# Patient Record
Sex: Male | Born: 1965 | Race: Black or African American | Hispanic: No | Marital: Married | Smoking: Never smoker
Health system: Southern US, Community
[De-identification: ages and names within clinical notes are randomized; demographics above are authoritative.]

## PROBLEM LIST (undated history)

## (undated) DIAGNOSIS — F419 Anxiety disorder, unspecified: Secondary | ICD-10-CM

## (undated) DIAGNOSIS — G4730 Sleep apnea, unspecified: Secondary | ICD-10-CM

## (undated) DIAGNOSIS — K219 Gastro-esophageal reflux disease without esophagitis: Secondary | ICD-10-CM

## (undated) DIAGNOSIS — E119 Type 2 diabetes mellitus without complications: Secondary | ICD-10-CM

## (undated) DIAGNOSIS — I10 Essential (primary) hypertension: Secondary | ICD-10-CM

## (undated) DIAGNOSIS — I428 Other cardiomyopathies: Secondary | ICD-10-CM

## (undated) DIAGNOSIS — M1990 Unspecified osteoarthritis, unspecified site: Secondary | ICD-10-CM

## (undated) DIAGNOSIS — E785 Hyperlipidemia, unspecified: Secondary | ICD-10-CM

## (undated) HISTORY — PX: CYSTOSCOPY: SUR368

## (undated) HISTORY — DX: Unspecified osteoarthritis, unspecified site: M19.90

## (undated) HISTORY — DX: Sleep apnea, unspecified: G47.30

## (undated) HISTORY — DX: Other cardiomyopathies: I42.8

## (undated) HISTORY — DX: Anxiety disorder, unspecified: F41.9

## (undated) HISTORY — DX: Type 2 diabetes mellitus without complications: E11.9

## (undated) HISTORY — DX: Gastro-esophageal reflux disease without esophagitis: K21.9

---

## 2007-12-16 ENCOUNTER — Ambulatory Visit: Payer: Self-pay

## 2011-04-01 ENCOUNTER — Emergency Department: Payer: Self-pay | Admitting: Emergency Medicine

## 2011-11-09 ENCOUNTER — Emergency Department: Payer: Self-pay | Admitting: Emergency Medicine

## 2013-07-03 ENCOUNTER — Emergency Department: Payer: Self-pay | Admitting: Unknown Physician Specialty

## 2013-07-03 LAB — COMPREHENSIVE METABOLIC PANEL
Anion Gap: 5 — ABNORMAL LOW (ref 7–16)
Chloride: 104 mmol/L (ref 98–107)
EGFR (Non-African Amer.): >60
SGOT(AST): 21 U/L (ref 15–37)
SGPT (ALT): 25 U/L (ref 12–78)
Sodium: 139 mmol/L (ref 136–145)
Total Protein: 7.3 g/dL (ref 6.4–8.2)

## 2013-07-03 LAB — URINALYSIS, COMPLETE
Glucose,UR: NEGATIVE mg/dL (ref 0–75)
Hyaline Cast: 3
Ketone: NEGATIVE
Leukocyte Esterase: NEGATIVE
Protein: NEGATIVE
RBC,UR: 7 /HPF (ref 0–5)
Specific Gravity: 1.026 (ref 1.003–1.030)
Squamous Epithelial: NONE SEEN
WBC UR: NONE SEEN /HPF (ref 0–5)

## 2013-07-03 LAB — CBC
HCT: 45.6 % (ref 40.0–52.0)
HGB: 15.2 g/dL (ref 13.0–18.0)
MCH: 27.2 pg (ref 26.0–34.0)
MCHC: 33.3 g/dL (ref 32.0–36.0)
MCV: 82 fL (ref 80–100)
Platelet: 291 10*3/uL (ref 150–440)
RDW: 14.3 % (ref 11.5–14.5)

## 2013-07-03 LAB — TROPONIN I: Troponin-I: <0.02 ng/mL

## 2013-07-04 LAB — URINE CULTURE

## 2016-12-15 ENCOUNTER — Emergency Department
Admission: EM | Admit: 2016-12-15 | Discharge: 2016-12-15 | Disposition: A | Discharge status: Home / Self Care | Payer: Self-pay | Attending: Student in an Organized Health Care Education/Training Program | Admitting: Student in an Organized Health Care Education/Training Program

## 2016-12-15 ENCOUNTER — Encounter: Payer: Self-pay | Admitting: Emergency Medicine

## 2016-12-15 DIAGNOSIS — J029 Acute pharyngitis, unspecified: Secondary | ICD-10-CM | POA: Insufficient documentation

## 2016-12-15 DIAGNOSIS — R0989 Other specified symptoms and signs involving the circulatory and respiratory systems: Secondary | ICD-10-CM | POA: Insufficient documentation

## 2016-12-15 DIAGNOSIS — I10 Essential (primary) hypertension: Secondary | ICD-10-CM | POA: Insufficient documentation

## 2016-12-15 DIAGNOSIS — F17200 Nicotine dependence, unspecified, uncomplicated: Secondary | ICD-10-CM | POA: Insufficient documentation

## 2016-12-15 HISTORY — DX: Essential (primary) hypertension: I10

## 2016-12-15 MED ORDER — AZITHROMYCIN 500 MG PO TABS
500.0000 mg | ORAL_TABLET | Freq: Once | ORAL | Status: AC
  Administered 2016-12-15: 500 mg via ORAL
  Filled 2016-12-15: qty 1

## 2016-12-15 MED ORDER — PANTOPRAZOLE SODIUM 20 MG PO TBEC: 20.0000 mg | DELAYED_RELEASE_TABLET | Freq: Every day | ORAL | 1 refills | Status: DC

## 2016-12-15 MED ORDER — DEXAMETHASONE 4 MG PO TABS
6.0000 mg | ORAL_TABLET | Freq: Once | ORAL | Status: AC
Start: 2016-12-15 — End: 2016-12-15
  Administered 2016-12-15: 6 mg via ORAL
  Filled 2016-12-15: qty 1.5

## 2016-12-15 MED ORDER — AZITHROMYCIN 250 MG PO TABS: ORAL_TABLET | ORAL | 0 refills | Status: DC

## 2016-12-15 NOTE — ED Provider Notes (Signed)
[facility] Emergency Department Provider Note    First MD Initiated Contact with Patient 12/15/16 1206     (approximate)  I have reviewed the triage vital signs and the nursing notes.   HISTORY  Chief Complaint No chief complaint on file.    HPI William Jimenez is a 50 y.o. male presents with complaint of one week of feeling that there is something stuck in the back of his throat. Patient states he has a history of recurrent tonsillitis. States whenever he solid food he feels like it is scratching the back of his throat. Does not feel that anything is stuck in his throat. He has no shortness of breath no abdominal pain. Does have a history of reflux.  No chest pain.  No recent fevers. Is able to swallow fluids without any competitions.  He does not smoke. No alcohol ingestion. No weight gain or weight loss.   Past Medical History:  Diagnosis Date  . Hypertension    FMH: no bleeding disorders History reviewed. No pertinent surgical history. There are no active problems to display for this patient.     Prior to Admission medications   Not on File    Allergies Patient has no known allergies.    Social History Social History  Substance Use Topics  . Smoking status: Never Smoker  . Smokeless tobacco: Current User  . Alcohol use No    Review of Systems Patient denies headaches, rhinorrhea, blurry vision, numbness, shortness of breath, chest pain, edema, cough, abdominal pain, nausea, vomiting, diarrhea, dysuria, fevers, rashes or hallucinations unless otherwise stated above in HPI. ____________________________________________   PHYSICAL EXAM:  VITAL SIGNS: Vitals:   12/15/16 1047  BP: (!) 178/113  Pulse: 84  Resp: 16  Temp: 98.2 F (36.8 C)    Constitutional: Alert and oriented. Well appearing and in no acute distress. Eyes: Conjunctivae are normal. PERRL. EOMI. Head: Atraumatic. Nose: No congestion/rhinnorhea. Mouth/Throat:  Mucous membranes are moist.  Uvula is midline, bilateral tonsillar enlargement with bilateral exudates. Neck: No stridor. Painless ROM. No cervical spine tenderness to palpation Hematological/Lymphatic/Immunilogical: No cervical lymphadenopathy. Cardiovascular: Normal rate, regular rhythm. Grossly normal heart sounds.  Good peripheral circulation. Respiratory: Normal respiratory effort.  No retractions. Lungs CTAB. Gastrointestinal: Soft and nontender. No distention. No abdominal bruits. No CVA tenderness. Musculoskeletal: No lower extremity tenderness nor edema.  No joint effusions. Neurologic:  Normal speech and language. No gross focal neurologic deficits are appreciated. No gait instability. Skin:  Skin is warm, dry and intact. No rash noted. Psychiatric: Mood and affect are normal. Speech and behavior are normal.  ____________________________________________   LABS (all labs ordered are listed, but only abnormal results are displayed)  No results found for this or any previous visit (from the past 24 hour(s)). ____________________________________________  EKG____________________________________________  RADIOLOGY   ____________________________________________   PROCEDURES  Procedure(s) performed:  Procedures    Critical Care performed: no ____________________________________________   INITIAL IMPRESSION / ASSESSMENT AND PLAN / ED COURSE  Pertinent labs & imaging results that were available during my care of the patient were reviewed by me and considered in my medical decision making (see chart for details).  DDX: Food bolus impaction, strep throat, tonsillitis, pharyngitis, soft gyrus, stricture  William Jimenez is a 50 y.o. who presents to the ED with above complaints with sensation of food being stuck in his throat for 3-4 days. Patient afebrile and hemodynamically stable. His clinical exam is concerning for strep pharyngitis given his enlarged tonsils  with exudates.  Is not currently consistent with peritonsillar abscess.  Patient is tolerating his secretions therefore do not feel this is consistent with food bolus in [place]. Possible stricture but his symptoms seemed to be more related to the oropharynx likely secondary to acute pharyngitis. He has no stridor. Breath sounds are equal bilaterally. His abdominal exam is soft and benign. I do feel patient would benefit from GI evaluation as an outpatient therefore we'll provide a referral. Given his history of reflux we'll also start patient on Protonix. Will give patient Decadron 4 tonsillar enlargement and symptomatically management of his pharyngitis.  Patient was able to tolerate PO and was able to ambulate with a steady gait.  Have discussed with the patient and available family all diagnostics and treatments performed thus far and all questions were answered to the best of my ability. The patient demonstrates understanding and agreement with plan.   Clinical Course      ____________________________________________   FINAL CLINICAL IMPRESSION(S) / ED DIAGNOSES  Final diagnoses:  Pharyngitis, unspecified etiology  Sensation of foreign body in throat      NEW MEDICATIONS STARTED DURING THIS VISIT:  New Prescriptions   No medications on file     Note:  This document was prepared using Dragon voice recognition software and may include unintentional dictation errors.    Willy Eddy, MD 12/15/16 [phone]

## 2016-12-15 NOTE — ED Triage Notes (Signed)
C/O pieces of food getting stuck in the back of throat lately, just over the past week.    Also states that "for years" has had post nasal drip.    Voice clear and strong.  Respirations regular and non labored.

## 2016-12-15 NOTE — ED Notes (Signed)
FIRST NURSE NOTE: Pt reports he feels like something is stuck in his throat for the past 3-4 days, pt has hx of enlarged tonsils.

## 2016-12-15 NOTE — Discharge Instructions (Signed)
Please schedule follow up with GI for further evaluation of reflux and heart burn.  Return immediately should you have any shortness of breath or inability to keep liquids down.

## 2016-12-15 NOTE — ED Notes (Signed)
AAOx3.  Skin warm and dry.  NAD 

## 2016-12-15 NOTE — ED Notes (Signed)
Ok to discharge home.  Patient to take regular BP medication at home.

## 2018-10-09 ENCOUNTER — Ambulatory Visit
Admission: RE | Admit: 2018-10-09 | Discharge: 2018-10-09 | Disposition: A | Discharge status: Home / Self Care | Payer: Disability Insurance | Source: Ambulatory Visit | Attending: Thoracic Surgery | Admitting: Thoracic Surgery

## 2018-10-09 ENCOUNTER — Other Ambulatory Visit: Payer: Self-pay | Admitting: Thoracic Surgery

## 2018-10-09 DIAGNOSIS — M5136 Other intervertebral disc degeneration, lumbar region: Secondary | ICD-10-CM | POA: Diagnosis not present

## 2018-10-09 DIAGNOSIS — M4316 Spondylolisthesis, lumbar region: Secondary | ICD-10-CM | POA: Diagnosis not present

## 2018-10-09 DIAGNOSIS — F329 Major depressive disorder, single episode, unspecified: Secondary | ICD-10-CM | POA: Diagnosis not present

## 2018-10-09 DIAGNOSIS — M545 Low back pain, unspecified: Secondary | ICD-10-CM

## 2018-10-09 DIAGNOSIS — M549 Dorsalgia, unspecified: Secondary | ICD-10-CM | POA: Diagnosis not present

## 2018-11-09 ENCOUNTER — Ambulatory Visit
Admission: RE | Admit: 2018-11-09 | Discharge: 2018-11-09 | Disposition: A | Discharge status: Home / Self Care | Payer: Disability Insurance | Source: Ambulatory Visit | Attending: Thoracic Surgery | Admitting: Thoracic Surgery

## 2018-11-09 ENCOUNTER — Other Ambulatory Visit: Payer: Self-pay | Admitting: Thoracic Surgery

## 2018-11-09 DIAGNOSIS — G8929 Other chronic pain: Secondary | ICD-10-CM

## 2018-11-09 DIAGNOSIS — M25561 Pain in right knee: Principal | ICD-10-CM

## 2018-11-13 ENCOUNTER — Ambulatory Visit
Admission: RE | Admit: 2018-11-13 | Discharge: 2018-11-13 | Disposition: A | Discharge status: Home / Self Care | Payer: Disability Insurance | Source: Ambulatory Visit | Attending: Thoracic Surgery | Admitting: Thoracic Surgery

## 2018-11-13 DIAGNOSIS — F329 Major depressive disorder, single episode, unspecified: Secondary | ICD-10-CM | POA: Diagnosis not present

## 2018-11-13 DIAGNOSIS — G8929 Other chronic pain: Secondary | ICD-10-CM

## 2018-11-13 DIAGNOSIS — M25561 Pain in right knee: Secondary | ICD-10-CM | POA: Diagnosis present

## 2018-11-13 DIAGNOSIS — M549 Dorsalgia, unspecified: Secondary | ICD-10-CM | POA: Insufficient documentation

## 2019-05-21 ENCOUNTER — Other Ambulatory Visit: Payer: Self-pay

## 2019-05-21 ENCOUNTER — Encounter: Payer: Self-pay | Admitting: Urology

## 2019-05-21 ENCOUNTER — Telehealth: Payer: Self-pay | Admitting: Urology

## 2019-05-21 ENCOUNTER — Ambulatory Visit (INDEPENDENT_AMBULATORY_CARE_PROVIDER_SITE_OTHER): Payer: No Typology Code available for payment source | Admitting: Urology

## 2019-05-21 VITALS — BP 186/136 | HR 86 | Ht 68.0 in | Wt 319.4 lb

## 2019-05-21 DIAGNOSIS — N5201 Erectile dysfunction due to arterial insufficiency: Secondary | ICD-10-CM | POA: Diagnosis not present

## 2019-05-21 DIAGNOSIS — E291 Testicular hypofunction: Secondary | ICD-10-CM

## 2019-05-21 DIAGNOSIS — R300 Dysuria: Secondary | ICD-10-CM | POA: Diagnosis not present

## 2019-05-21 DIAGNOSIS — R3129 Other microscopic hematuria: Secondary | ICD-10-CM

## 2019-05-21 LAB — MICROSCOPIC EXAMINATION
Epithelial Cells (non renal): NONE SEEN /hpf (ref 0–10)
WBC, UA: NONE SEEN /hpf (ref 0–5)

## 2019-05-21 LAB — URINALYSIS, COMPLETE
Bilirubin, UA: NEGATIVE
Glucose, UA: NEGATIVE
Ketones, UA: NEGATIVE
Leukocytes,UA: NEGATIVE
Nitrite, UA: NEGATIVE
Protein,UA: NEGATIVE
Specific Gravity, UA: 1.02 (ref 1.005–1.030)
Urobilinogen, Ur: 0.2 mg/dL (ref 0.2–1.0)
pH, UA: 7.5 (ref 5.0–7.5)

## 2019-05-21 MED ORDER — AVANAFIL 200 MG PO TABS: 1.0000 | ORAL_TABLET | Freq: Every day | ORAL | 6 refills | Status: DC | PRN

## 2019-05-21 MED ORDER — VARDENAFIL HCL 10 MG PO TABS: ORAL_TABLET | ORAL | 6 refills | Status: DC

## 2019-05-21 NOTE — Telephone Encounter (Signed)
What other medication should he try?

## 2019-05-21 NOTE — Patient Instructions (Signed)
Hematuria, Adult Hematuria is blood in the urine. Blood may be visible in the urine, or it may be identified with a test. This condition can be caused by infections of the bladder, urethra, kidney, or prostate. Other possible causes include:  Kidney stones.  Cancer of the urinary tract.  Too much calcium in the urine.  Conditions that are passed from parent to child (inherited conditions).  Exercise that requires a lot of energy. Infections can usually be treated with medicine, and a kidney stone usually will pass through your urine. If neither of these is the cause of your hematuria, more tests may be needed to identify the cause of your symptoms. It is very important to tell your health care provider about any blood in your urine, even if it is painless or the blood stops without treatment. Blood in the urine, when it happens and then stops and then happens again, can be a symptom of a very serious condition, including cancer. There is no pain in the initial stages of many urinary cancers. Follow these instructions at home: Medicines  Take over-the-counter and prescription medicines only as told by your health care provider.  If you were prescribed an antibiotic medicine, take it as told by your health care provider. Do not stop taking the antibiotic even if you start to feel better. Eating and drinking  Drink enough fluid to keep your urine clear or pale yellow. It is recommended that you drink 3-4 quarts (2.8-3.8 L) a day. If you have been diagnosed with an infection, it is recommended that you drink cranberry juice in addition to large amounts of water.  Avoid caffeine, tea, and carbonated beverages. These tend to irritate the bladder.  Avoid alcohol because it may irritate the prostate (men). General instructions  If you have been diagnosed with a kidney stone, follow your health care provider's instructions about straining your urine to catch the stone.  Empty your bladder  often. Avoid holding urine for long periods of time.  If you are male: ? After a bowel movement, wipe from front to back and use each piece of toilet paper only once. ? Empty your bladder before and after sex.  Pay attention to any changes in your symptoms. Tell your health care provider about any changes or any new symptoms.  It is your responsibility to get your test results. Ask your health care provider, or the department performing the test, when your results will be ready.  Keep all follow-up visits as told by your health care provider. This is important. Contact a health care provider if:  You develop back pain.  You have a fever.  You have nausea or vomiting.  Your symptoms do not improve after 3 days.  Your symptoms get worse. Get help right away if:  You develop severe vomiting and are unable take medicine without vomiting.  You develop severe pain in your back or abdomen even though you are taking medicine.  You pass a large amount of blood in your urine.  You pass blood clots in your urine.  You feel very weak or like you might faint.  You faint. Summary  Hematuria is blood in the urine. It has many possible causes.  It is very important that you tell your health care provider about any blood in your urine, even if it is painless or the blood stops without treatment.  Take over-the-counter and prescription medicines only as told by your health care provider.  Drink enough fluid to keep   your urine clear or pale yellow. This information is not intended to replace advice given to you by your health care provider. Make sure you discuss any questions you have with your health care provider. Document Released: 12/02/2005 Document Revised: 01/04/2017 Document Reviewed: 01/04/2017 Elsevier Interactive Patient Education  2019 [employer] Erectile Dysfunction Erectile dysfunction (ED) is the inability to get or keep an erection in order to have sexual  intercourse. Erectile dysfunction may include:  Inability to get an erection.  Lack of enough hardness of the erection to allow penetration.  Loss of the erection before sex is finished. What are the causes? This condition may be caused by:  Certain medicines, such as: ? Pain relievers. ? Antihistamines. ? Antidepressants. ? Blood pressure medicines. ? Water pills (diuretics). ? Ulcer medicines. ? Muscle relaxants. ? Drugs.  Excessive drinking.  Psychological causes, such as: ? Anxiety. ? Depression. ? Sadness. ? Exhaustion. ? Performance fear. ? Stress.  Physical causes, such as: ? Artery problems. This may include diabetes, smoking, liver disease, or atherosclerosis. ? High blood pressure. ? Hormonal problems, such as low testosterone. ? Obesity. ? Nerve problems. This may include back or pelvic injuries, diabetes mellitus, multiple sclerosis, or Parkinson disease. What are the signs or symptoms? Symptoms of this condition include:  Inability to get an erection.  Lack of enough hardness of the erection to allow penetration.  Loss of the erection before sex is finished.  Normal erections at some times, but with frequent unsatisfactory episodes.  Low sexual satisfaction in either partner due to erection problems.  A curved penis occurring with erection. The curve may cause pain or the penis may be too curved to allow for intercourse.  Never having nighttime erections. How is this diagnosed? This condition is often diagnosed by:  Performing a physical exam to find other diseases or specific problems with the penis.  Asking you detailed questions about the problem.  Performing blood tests to check for diabetes mellitus or to measure hormone levels.  Performing other tests to check for underlying health conditions.  Performing an ultrasound exam to check for scarring.  Performing a test to check blood flow to the penis.  Doing a sleep study at home to  measure nighttime erections. How is this treated? This condition may be treated by:  Medicine taken by mouth to help you achieve an erection (oral medicine).  Hormone replacement therapy to replace low testosterone levels.  Medicine that is injected into the penis. Your health care provider may instruct you how to give yourself these injections at home.  Vacuum pump. This is a pump with a ring on it. The pump and ring are placed on the penis and used to create pressure that helps the penis become erect.  Penile implant surgery. In this procedure, you may receive: ? An inflatable implant. This consists of cylinders, a pump, and a reservoir. The cylinders can be inflated with a fluid that helps to create an erection, and they can be deflated after intercourse. ? A semi-rigid implant. This consists of two silicone rubber rods. The rods provide some rigidity. They are also flexible, so the penis can both curve downward in its normal position and become straight for sexual intercourse.  Blood vessel surgery, to improve blood flow to the penis. During this procedure, a blood vessel from a different part of the body is placed into the penis to allow blood to flow around (bypass) damaged or blocked blood vessels.  Lifestyle changes, such as exercising  more, losing weight, and quitting smoking. Follow these instructions at home: Medicines   Take over-the-counter and prescription medicines only as told by your health care provider. Do not increase the dosage without first discussing it with your health care provider.  If you are using self-injections, perform injections as directed by your health care provider. Make sure to avoid any veins that are on the surface of the penis. After giving an injection, apply pressure to the injection site for 5 minutes. General instructions  Exercise regularly, as directed by your health care provider. Work with your health care provider to lose weight, if needed.   Do not use any products that contain nicotine or tobacco, such as cigarettes and e-cigarettes. If you need help quitting, ask your health care provider.  Before using a vacuum pump, read the instructions that come with the pump and discuss any questions with your health care provider.  Keep all follow-up visits as told by your health care provider. This is important. Contact a health care provider if:  You feel nauseous.  You vomit. Get help right away if:  You are taking oral or injectable medicines and you have an erection that lasts longer than 4 hours. If your health care provider is unavailable, go to the nearest emergency room for evaluation. An erection that lasts much longer than 4 hours can result in permanent damage to your penis.  You have severe pain in your groin or abdomen.  You develop redness or severe swelling of your penis.  You have redness spreading up into your groin or lower abdomen.  You are unable to urinate.  You experience chest pain or a rapid heart beat (palpitations) after taking oral medicines. Summary  Erectile dysfunction (ED) is the inability to get or keep an erection during sexual intercourse. This problem can usually be treated successfully.  This condition is diagnosed based on a physical exam, your symptoms, and tests to determine the cause. Treatment varies depending on the cause, and may include medicines, hormone therapy, surgery, or vacuum pump.  You may need follow-up visits to make sure that you are using your medicines or devices correctly.  Get help right away if you are taking or injecting medicines and you have an erection that lasts longer than 4 hours. This information is not intended to replace advice given to you by your health care provider. Make sure you discuss any questions you have with your health care provider. Document Released: 11/29/2000 Document Revised: 12/18/2016 Document Reviewed: 12/18/2016 Elsevier Interactive  Patient Education  2019 [employer].

## 2019-05-21 NOTE — Progress Notes (Signed)
05/21/2019 11:11 AM   William Jimenez 1966-08-09 [id number]  Referring provider: [facility] [address] [place], [place] [address]  Chief Complaint  Patient presents with  . Dysuria    HPI: Aman was sent over for dysuria and lower urinary tract symptoms.  He is on tamsulosin.  Urine culture grew less than 10,000 colony count April 2020. He has a good flow  But some hesitancy. No gross hematuria. He's been told there were some "red cells". He has a h/o prostate infections but no STI. He tried tamsulosin but had a bothersome stuffy nose. He wasn't sure tams helped any. No abx.   He also has a history of low T, diabetes and erectile dysfunction.  He is tried Viagra in the past. He has a low libido. He was given a T shot at some pt. He tried 100 mg Viagra. He also tried Cialis and it gave him a heart burn.   Modifying factors: There are no other modifying factors  Associated signs and symptoms: There are no other associated signs and symptoms Aggravating and relieving factors: There are no other aggravating or relieving factors Severity: Moderate Duration: Persistent    PMH: Past Medical History:  Diagnosis Date  . Hypertension     Surgical History: History reviewed. No pertinent surgical history.  Home Medications:  Allergies as of 05/21/2019   No Known Allergies     Medication List       Accurate as of May 21, 2019 11:11 AM. If you have any questions, ask your nurse or doctor.        STOP taking these medications   azithromycin 250 MG tablet Commonly known as:  Zithromax Stopped by:  Jerilee Field, MD     TAKE these medications   lisinopril-hydrochlorothiazide 20-25 MG tablet Commonly known as:  ZESTORETIC Take 1 tablet by mouth daily. for high blood pressure   meloxicam 15 MG tablet Commonly known as:  MOBIC TAKE 1 TABLET BY MOUTH ONCE DAILY FOR PAIN   metFORMIN 500 MG tablet Commonly known as:  GLUCOPHAGE TAKE 1 TABLET BY  MOUTH ONCE DAILY FOR DIABETES   pantoprazole 20 MG tablet Commonly known as:  Protonix Take 1 tablet (20 mg total) by mouth daily.   tamsulosin 0.4 MG Caps capsule Commonly known as:  FLOMAX TAKE 1 CAPSULE BY MOUTH ONCE DAILY 30 MINUTES AFTER THE SAME MEAL       Allergies: No Known Allergies  Family History: History reviewed. No pertinent family history.  Social History:  reports that he has never smoked. He uses smokeless tobacco. He reports that he does not drink alcohol or use drugs.  ROS:                                        Physical Exam: There were no vitals taken for this visit.  Constitutional:  Alert and oriented, No acute distress. HEENT: [place] AT, moist mucus membranes.  Trachea midline, no masses. Cardiovascular: No clubbing, cyanosis, or edema. Respiratory: Normal respiratory effort, no increased work of breathing. GI: Abdomen is soft, nontender, nondistended, no abdominal masses GU: No CVA tenderness Lymph: No cervical or inguinal lymphadenopathy. Skin: No rashes, bruises or suspicious lesions. Neurologic: Grossly intact, no focal deficits, moving all 4 extremities. Psychiatric: Normal mood and affect. GU: Penis circumcised, normal foreskin, testicles descended bilaterally and palpably normal, bilateral epididymis palpably normal, scrotum  normal DRE: Prostate 30 g, smooth without hard area or nodule  Laboratory Data: Lab Results  Component Value Date   WBC 8.2 07/03/2013   HGB 15.2 07/03/2013   HCT 45.6 07/03/2013   MCV 82 07/03/2013   PLT 291 07/03/2013    Lab Results  Component Value Date   CREATININE 1.21 07/03/2013    No results found for: PSA  No results found for: TESTOSTERONE  No results found for: HGBA1C  Urinalysis    Component Value Date/Time   COLORURINE Yellow 07/03/2013 1011   APPEARANCEUR Clear 07/03/2013 1011   LABSPEC 1.026 07/03/2013 1011   PHURINE 5.0 07/03/2013 1011   GLUCOSEU Negative 07/03/2013  1011   HGBUR 2+ 07/03/2013 1011   BILIRUBINUR Negative 07/03/2013 1011   KETONESUR Negative 07/03/2013 1011   PROTEINUR Negative 07/03/2013 1011   NITRITE Negative 07/03/2013 1011   LEUKOCYTESUR Negative 07/03/2013 1011    Lab Results  Component Value Date   BACTERIA TRACE 07/03/2013    Pertinent Imaging: No results found for this or any previous visit. No results found for this or any previous visit. No results found for this or any previous visit. No results found for this or any previous visit. No results found for this or any previous visit. No results found for this or any previous visit. No results found for this or any previous visit. No results found for this or any previous visit.  Assessment & Plan:    1. Dysuria, MH - check renal US and cystoscopy . Discussed daily tadalafil.  - Urinalysis, Complete  2. Low T, low libido - I sent endocrine labs   3. ED - discussed the nature r/b of pde5i, VED, Muse, injections. I sent a Rx for avanafil. He already called and it's expensive so I sent Levitra.     No follow-ups on file.  Jerilee Field, MD  [facility] [address] [place], [place] [address] [phone]

## 2019-05-21 NOTE — Telephone Encounter (Signed)
Pt needs to speak with someone about getting another RX that isn't so expensive.

## 2019-05-22 LAB — TESTOSTERONE: Testosterone: 196 ng/dL — ABNORMAL LOW (ref 264–916)

## 2019-05-22 LAB — PSA: Prostate Specific Ag, Serum: 0.8 ng/mL (ref 0.0–4.0)

## 2019-05-22 LAB — HEMOGLOBIN AND HEMATOCRIT, BLOOD
Hematocrit: 42.9 % (ref 37.5–51.0)
Hemoglobin: 13.8 g/dL (ref 13.0–17.7)

## 2019-05-22 LAB — LUTEINIZING HORMONE: LH: 6.4 m[IU]/mL (ref 1.7–8.6)

## 2019-05-22 LAB — PROLACTIN: Prolactin: 7.1 ng/mL (ref 4.0–15.2)

## 2019-06-07 ENCOUNTER — Ambulatory Visit
Admission: RE | Admit: 2019-06-07 | Discharge: 2019-06-07 | Disposition: A | Discharge status: Home / Self Care | Payer: No Typology Code available for payment source | Source: Ambulatory Visit | Attending: Urology | Admitting: Urology

## 2019-06-07 ENCOUNTER — Other Ambulatory Visit: Payer: Self-pay

## 2019-06-07 DIAGNOSIS — R3129 Other microscopic hematuria: Secondary | ICD-10-CM | POA: Insufficient documentation

## 2019-06-09 ENCOUNTER — Other Ambulatory Visit: Payer: Self-pay

## 2019-06-09 ENCOUNTER — Encounter: Payer: Self-pay | Admitting: Urology

## 2019-06-09 ENCOUNTER — Ambulatory Visit (INDEPENDENT_AMBULATORY_CARE_PROVIDER_SITE_OTHER): Payer: No Typology Code available for payment source | Admitting: Urology

## 2019-06-09 VITALS — BP 153/99 | HR 87 | Ht 68.0 in | Wt 319.0 lb

## 2019-06-09 DIAGNOSIS — R7989 Other specified abnormal findings of blood chemistry: Secondary | ICD-10-CM | POA: Diagnosis not present

## 2019-06-09 DIAGNOSIS — R300 Dysuria: Secondary | ICD-10-CM | POA: Diagnosis not present

## 2019-06-09 DIAGNOSIS — R3129 Other microscopic hematuria: Secondary | ICD-10-CM | POA: Diagnosis not present

## 2019-06-09 DIAGNOSIS — N5201 Erectile dysfunction due to arterial insufficiency: Secondary | ICD-10-CM | POA: Diagnosis not present

## 2019-06-09 LAB — URINALYSIS, COMPLETE
Bilirubin, UA: NEGATIVE
Glucose, UA: NEGATIVE
Ketones, UA: NEGATIVE
Leukocytes,UA: NEGATIVE
Nitrite, UA: NEGATIVE
Protein,UA: NEGATIVE
Specific Gravity, UA: 1.025 (ref 1.005–1.030)
Urobilinogen, Ur: 0.2 mg/dL (ref 0.2–1.0)
pH, UA: 5 (ref 5.0–7.5)

## 2019-06-09 LAB — MICROSCOPIC EXAMINATION
Bacteria, UA: NONE SEEN
Epithelial Cells (non renal): NONE SEEN /hpf (ref 0–10)
RBC: NONE SEEN /hpf (ref 0–2)

## 2019-06-09 MED ORDER — TADALAFIL 5 MG PO TABS: 5.0000 mg | ORAL_TABLET | Freq: Every day | ORAL | 3 refills | Status: DC

## 2019-06-09 MED ORDER — LIDOCAINE HCL URETHRAL/MUCOSAL 2 % EX GEL: 1.0000 "application " | Freq: Once | CUTANEOUS | Status: DC

## 2019-06-09 MED ORDER — CLOMIPHENE CITRATE 50 MG PO TABS: 50.0000 mg | ORAL_TABLET | ORAL | 3 refills | Status: DC

## 2019-06-09 NOTE — Progress Notes (Signed)
06/09/2019 3:24 PM   William Jimenez 1966/12/07 [id number]  Referring provider: Center, [facility] [address] [place],  [place] [id number]  No chief complaint on file.   HPI:  F/u -  1) dysuria and lower urinary tract symptoms - He is on tamsulosin.  Urine culture grew less than 10,000 colony count April 2020. He has a good flow  But some hesitancy. No gross hematuria. He's been told there were some "red cells". He has a h/o prostate infections but no STI. He tried tamsulosin but had a bothersome stuffy nose. He wasn't sure if tamsulosin helped any. No abx.   2) Low T - history of low T, diabetes and erectile dysfunction.  He is tried Viagra in the past. He has a low libido. He was given T injections at some pt. He tried 100 mg Viagra. He also tried Cialis and it gave him a heart burn.   He returns for cystoscopy in eval of MH and dysuria. Renal US was normal on 06/07/2019. He had right renal cysts.   Also returns in management of low T and ED. Avanafil was expensive so I sent in Levitra. He did not pick up Levitra. His testosterone was only 196.  PSA 0.8.  Hematocrit 43.  Prolactin normal.  LH 6.4.    PMH: Past Medical History:  Diagnosis Date  . Hypertension     Surgical History: No past surgical history on file.  Home Medications:  Allergies as of 06/09/2019   No Known Allergies     Medication List       Accurate as of June 09, 2019  3:24 PM. If you have any questions, ask your nurse or doctor.        lisinopril-hydrochlorothiazide 20-25 MG tablet Commonly known as: ZESTORETIC Take 1 tablet by mouth daily. for high blood pressure   meloxicam 15 MG tablet Commonly known as: MOBIC TAKE 1 TABLET BY MOUTH ONCE DAILY FOR PAIN   metFORMIN 500 MG tablet Commonly known as: GLUCOPHAGE TAKE 1 TABLET BY MOUTH ONCE DAILY FOR DIABETES   pantoprazole 20 MG tablet Commonly known as: Protonix Take 1 tablet (20 mg total) by mouth daily.   tamsulosin 0.4  MG Caps capsule Commonly known as: FLOMAX TAKE 1 CAPSULE BY MOUTH ONCE DAILY 30 MINUTES AFTER THE SAME MEAL   vardenafil 10 MG tablet Commonly known as: LEVITRA Take 1-2 tablets as needed about 1 hour before sexual activity       Allergies: No Known Allergies  Family History: Family History  Problem Relation Age of Onset  . Prostate cancer Neg Hx   . Bladder Cancer Neg Hx   . Kidney cancer Neg Hx     Social History:  reports that he has never smoked. He uses smokeless tobacco. He reports that he does not drink alcohol or use drugs.  ROS:                                        There were no vitals taken for this visit. NED. A&Ox3.   No respiratory distress   Abd soft, NT, ND Normal phallus with bilateral descended testicles  Cystoscopy Procedure Note  Patient identification was confirmed, informed consent was obtained, and patient was prepped using Betadine solution.  Lidocaine jelly was administered per urethral meatus.     Pre-Procedure: - Inspection reveals a normal caliber ureteral meatus.  Procedure: The flexible cystoscope was introduced without difficulty - No urethral strictures/lesions are present. - mild prostatic hyperplasia, borderline obstructing   - Elevated bladder neck - Bilateral ureteral orifices identified - Bladder mucosa  reveals no ulcers, tumors, or lesions - No bladder stones - No trabeculation  Retroflexion shows normal bladder neck. Slight prostate protrusion (5 mm).   Edson Snowball was chaperone.    Post-Procedure: - Patient tolerated the procedure well  Laboratory Data: Lab Results  Component Value Date   WBC 8.2 07/03/2013   HGB 13.8 05/21/2019   HCT 42.9 05/21/2019   MCV 82 07/03/2013   PLT 291 07/03/2013    Lab Results  Component Value Date   CREATININE 1.21 07/03/2013    No results found for: PSA  Lab Results  Component Value Date   TESTOSTERONE 196 (L) 05/21/2019    No results found for:  HGBA1C  Urinalysis    Component Value Date/Time   COLORURINE Yellow 07/03/2013 1011   APPEARANCEUR Hazy (A) 05/21/2019 1140   LABSPEC 1.026 07/03/2013 1011   PHURINE 5.0 07/03/2013 1011   GLUCOSEU Negative 05/21/2019 1140   GLUCOSEU Negative 07/03/2013 1011   HGBUR 2+ 07/03/2013 1011   BILIRUBINUR Negative 05/21/2019 1140   BILIRUBINUR Negative 07/03/2013 1011   KETONESUR Negative 07/03/2013 1011   PROTEINUR Negative 05/21/2019 1140   PROTEINUR Negative 07/03/2013 1011   NITRITE Negative 05/21/2019 1140   NITRITE Negative 07/03/2013 1011   LEUKOCYTESUR Negative 05/21/2019 1140   LEUKOCYTESUR Negative 07/03/2013 1011    Lab Results  Component Value Date   LABMICR See below: 05/21/2019   WBCUA None seen 05/21/2019   LABEPIT None seen 05/21/2019   BACTERIA Few (A) 05/21/2019    Pertinent Imaging: Renal US, l-spine films   No results found for this or any previous visit. No results found for this or any previous visit. No results found for this or any previous visit. No results found for this or any previous visit. Results for orders placed during the hospital encounter of 06/07/19  US RENAL   Narrative CLINICAL DATA:  Microscopic hematuria  EXAM: RENAL / URINARY TRACT ULTRASOUND COMPLETE  COMPARISON:  None  FINDINGS: Right Kidney:  Renal measurements: 12.3 x 6.4 x 6.8 cm = volume: 279 mL. Normal cortical thickness. Upper normal cortical echogenicity. Peripelvic cyst mid RIGHT kidney 19 x 24 x 20 mm. Additional exophytic lateral cyst upper LEFT kidney 21 x 18 x 24 mm. No solid mass, hydronephrosis or shadowing calcification.  Left Kidney:  Renal measurements: 11.5 x 6.9 x 6.4 cm = volume: 268 mL. Normal cortical thickness and echogenicity. No mass, hydronephrosis, or shadowing calcification. Incompletely distended. BILATERAL ureteral jets present. Question debris at bladder base versus minimally enlarged prostate gland.  Bladder:  Contains only minimal  urine. BILATERAL ureteral jets present. Echogenic material at dependent bladder base question minimal prostatic enlargement versus dependent debris, suboptimally assessed due to inadequate bladder distention.  IMPRESSION: RIGHT renal cysts.  Question prostatic enlargement versus dependent debris within a poorly distended urinary bladder, suboptimally assessed.   Electronically Signed   By: Ulyses Southward M.D.   On: 06/07/2019 13:54    No results found for this or any previous visit. No results found for this or any previous visit. No results found for this or any previous visit.  Assessment & Plan:    1. Dysuria Cystoscopy normal. Mild BPH. No stricture or tumor.  - Urinalysis, Complete  2. Other microscopic hematuria Renal US normal benign  - Urinalysis, Complete  3. Low T / ED -we discussed the nature risk and benefits of a low-dose daily tadalafil.  He will start that.  If he gets heartburn will consider Levitra.  Also discussed the nature risk benefits and alternatives to clomiphene.  Given his weight gain he is a good candidate for clomiphene.  He will start.  No follow-ups on file.  Festus Aloe, MD  [facility] [address], [place], [place] [address] [phone]

## 2019-06-24 ENCOUNTER — Telehealth: Payer: Self-pay | Admitting: Urology

## 2019-06-24 DIAGNOSIS — N5201 Erectile dysfunction due to arterial insufficiency: Secondary | ICD-10-CM

## 2019-06-24 MED ORDER — TADALAFIL 5 MG PO TABS: 5.0000 mg | ORAL_TABLET | Freq: Every day | ORAL | 3 refills | Status: DC

## 2019-06-24 NOTE — Telephone Encounter (Signed)
Called pt informed him that he can get Cialis at [facility] with good.rx coupon. RX sent to [facility].

## 2019-06-24 NOTE — Telephone Encounter (Signed)
Pt states the Rx for Cialis is going to be to expensive and he will not be able to afford this medication. He wants to know if there is a cheaper option.

## 2019-07-15 ENCOUNTER — Other Ambulatory Visit: Payer: No Typology Code available for payment source

## 2019-07-15 ENCOUNTER — Other Ambulatory Visit: Payer: Self-pay

## 2019-07-15 DIAGNOSIS — R7989 Other specified abnormal findings of blood chemistry: Secondary | ICD-10-CM

## 2019-07-16 LAB — TESTOSTERONE: Testosterone: 420 ng/dL (ref 264–916)

## 2019-07-20 ENCOUNTER — Telehealth: Payer: Self-pay | Admitting: Family Medicine

## 2019-07-20 DIAGNOSIS — N5201 Erectile dysfunction due to arterial insufficiency: Secondary | ICD-10-CM

## 2019-07-20 NOTE — Telephone Encounter (Signed)
Unable to leave a message, no VM

## 2019-07-20 NOTE — Telephone Encounter (Signed)
Pt says he has been taking Cialis for 1 month, the daily dose of 5 mg and it's not working for him.  Please give pt a call.

## 2019-07-20 NOTE — Telephone Encounter (Signed)
-----   Message from Festus Aloe, MD sent at 07/18/2019 10:06 PM EDT ----- Notify patient is testosterone looks great - normal level - f/u with me as planned  ----- Message ----- From: Kyra Manges, CMA Sent: 07/16/2019   8:45 AM EDT To: Festus Aloe, MD   ----- Message ----- From: Interface, [facility] Lab Results In Sent: 07/16/2019   5:38 AM EDT To: [name]

## 2019-07-22 ENCOUNTER — Telehealth: Payer: Self-pay

## 2019-07-22 MED ORDER — TADALAFIL 5 MG PO TABS: ORAL_TABLET | ORAL | 3 refills | Status: DC

## 2019-07-22 NOTE — Telephone Encounter (Signed)
Spoke to patient and informed a message has been sent to the doctor. We are waiting on response.

## 2019-07-22 NOTE — Telephone Encounter (Signed)
Festus Aloe, MD  Kyra Manges, CMA  Caller: Unspecified (2 days ago, 3:00 PM)        Got it, he should stop the daily dose and take 2 - 4 tabs of the 5 mg tadalafil about an hour before sex. If that doesn't help with erections we can consider sidenafil (generic for Viagra) -- thanks!    Rx sent to pharmacy, patient notified.

## 2019-07-22 NOTE — Telephone Encounter (Signed)
Received after hours call from patient regarding medication not working. LMOM for patient to return call.

## 2019-07-29 ENCOUNTER — Other Ambulatory Visit: Payer: Self-pay | Admitting: Family Medicine

## 2019-07-30 ENCOUNTER — Other Ambulatory Visit: Payer: Self-pay | Admitting: Family Medicine

## 2019-07-30 DIAGNOSIS — M25551 Pain in right hip: Secondary | ICD-10-CM

## 2019-08-03 ENCOUNTER — Ambulatory Visit: Payer: PRIVATE HEALTH INSURANCE

## 2019-08-18 ENCOUNTER — Telehealth: Payer: Self-pay | Admitting: Urology

## 2019-08-18 NOTE — Telephone Encounter (Signed)
Your last note mentions perhaps a trial of Levitra, please advise thanks

## 2019-08-18 NOTE — Telephone Encounter (Signed)
Pt LMOM and states that the Tadalafil is not working for him. Please advise.

## 2019-08-21 MED ORDER — VARDENAFIL HCL 20 MG PO TABS: 20.0000 mg | ORAL_TABLET | ORAL | 6 refills | Status: DC | PRN

## 2019-09-08 ENCOUNTER — Other Ambulatory Visit: Payer: Self-pay

## 2019-09-08 ENCOUNTER — Ambulatory Visit (INDEPENDENT_AMBULATORY_CARE_PROVIDER_SITE_OTHER): Payer: No Typology Code available for payment source | Admitting: Urology

## 2019-09-08 ENCOUNTER — Encounter: Payer: Self-pay | Admitting: Urology

## 2019-09-08 ENCOUNTER — Ambulatory Visit: Payer: No Typology Code available for payment source | Admitting: Urology

## 2019-09-08 VITALS — BP 178/117 | HR 87 | Ht 71.0 in | Wt 285.0 lb

## 2019-09-08 DIAGNOSIS — N5201 Erectile dysfunction due to arterial insufficiency: Secondary | ICD-10-CM | POA: Diagnosis not present

## 2019-09-08 DIAGNOSIS — R7989 Other specified abnormal findings of blood chemistry: Secondary | ICD-10-CM

## 2019-09-08 MED ORDER — CLOMIPHENE CITRATE 50 MG PO TABS: 50.0000 mg | ORAL_TABLET | ORAL | 5 refills | Status: DC

## 2019-09-08 MED ORDER — SILDENAFIL CITRATE 20 MG PO TABS: 20.0000 mg | ORAL_TABLET | Freq: Every day | ORAL | 11 refills | Status: DC

## 2019-09-08 NOTE — Progress Notes (Signed)
09/08/2019 2:46 PM   William Jimenez Jun 09, 1966 [id number]  Referring provider: Center, [facility] [place],  [place] [address]  No chief complaint on file.   HPI:  F/u -   1) Low T - history of low T, diabetes, obesity and erectile dysfunction. He has a low libido. He was given T injections at some pt. He tried 100 mg Viagra. He also tried Cialis and it gave him a heart burn.T was 196.  PSA 0.8.  Prolactin normal.  LH 6.4.   2) MH/dysuria, LUTS  - on tamsulosin. Urine culture grew less than 10,000 colony count April 2020. He has a good flow But some hesitancy. No gross hematuria. He tried tamsulosin but had a bothersome stuffy nose. He wasn't sure if tamsulosin helped any. . Renal US was normal on 06/07/2019. He had right renal cysts. Cysto normal.   He returns in management of low T and ED. He started clomiphene.  Testosterone rose nicely to 420.  He tried tadalafil. Vardenafil was expensive. Tadalafil did not work well.   PMH: Past Medical History:  Diagnosis Date  . Hypertension     Surgical History: No past surgical history on file.  Home Medications:  Allergies as of 09/08/2019   No Known Allergies     Medication List       Accurate as of September 08, 2019  2:46 PM. If you have any questions, ask your nurse or doctor.        clomiPHENE 50 MG tablet Commonly known as: CLOMID Take 1 tablet (50 mg total) by mouth See admin instructions. Take one tablet po Monday and one tablet po Thursday   lisinopril-hydrochlorothiazide 20-25 MG tablet Commonly known as: ZESTORETIC Take 1 tablet by mouth daily. for high blood pressure   meloxicam 15 MG tablet Commonly known as: MOBIC TAKE 1 TABLET BY MOUTH ONCE DAILY FOR PAIN   metFORMIN 500 MG tablet Commonly known as: GLUCOPHAGE TAKE 1 TABLET BY MOUTH ONCE DAILY FOR DIABETES   pantoprazole 20 MG tablet Commonly known as: Protonix Take 1 tablet (20 mg total) by mouth daily.    tadalafil 5 MG tablet Commonly known as: CIALIS Take 2-4 tablets 1 hour as needed before intercourse.   tamsulosin 0.4 MG Caps capsule Commonly known as: FLOMAX TAKE 1 CAPSULE BY MOUTH ONCE DAILY 30 MINUTES AFTER THE SAME MEAL   vardenafil 20 MG tablet Commonly known as: Levitra Take 1 tablet (20 mg total) by mouth as needed for erectile dysfunction (can take 1/2 to 1 tab).       Allergies: No Known Allergies  Family History: Family History  Problem Relation Age of Onset  . Prostate cancer Neg Hx   . Bladder Cancer Neg Hx   . Kidney cancer Neg Hx     Social History:  reports that he has never smoked. He uses smokeless tobacco. He reports that he does not drink alcohol or use drugs.  ROS:                                        Physical Exam: There were no vitals taken for this visit.  Constitutional:  Alert and oriented, No acute distress. HEENT: [place] AT, moist mucus membranes.  Trachea midline, no masses. Cardiovascular: No clubbing, cyanosis, or edema. Respiratory: Normal respiratory effort, no increased work of breathing. GI: Abdomen is soft, nontender, nondistended,  no abdominal masses GU: No CVA tenderness Skin: No rashes, bruises or suspicious lesions. Neurologic: Grossly intact, no focal deficits, moving all 4 extremities. Psychiatric: Normal mood and affect.  Laboratory Data: Lab Results  Component Value Date   WBC 8.2 07/03/2013   HGB 13.8 05/21/2019   HCT 42.9 05/21/2019   MCV 82 07/03/2013   PLT 291 07/03/2013    Lab Results  Component Value Date   CREATININE 1.21 07/03/2013    No results found for: PSA  Lab Results  Component Value Date   TESTOSTERONE 420 07/15/2019    No results found for: HGBA1C  Urinalysis    Component Value Date/Time   COLORURINE Yellow 07/03/2013 1011   APPEARANCEUR Clear 06/09/2019 1522   LABSPEC 1.026 07/03/2013 1011   PHURINE 5.0 07/03/2013 1011   GLUCOSEU Negative 06/09/2019 1522    GLUCOSEU Negative 07/03/2013 1011   HGBUR 2+ 07/03/2013 1011   BILIRUBINUR Negative 06/09/2019 1522   BILIRUBINUR Negative 07/03/2013 1011   KETONESUR Negative 07/03/2013 1011   PROTEINUR Negative 06/09/2019 1522   PROTEINUR Negative 07/03/2013 1011   NITRITE Negative 06/09/2019 1522   NITRITE Negative 07/03/2013 1011   LEUKOCYTESUR Negative 06/09/2019 1522   LEUKOCYTESUR Negative 07/03/2013 1011    Lab Results  Component Value Date   LABMICR See below: 06/09/2019   WBCUA 0-5 06/09/2019   LABEPIT None seen 06/09/2019   MUCUS Present (A) 06/09/2019   BACTERIA None seen 06/09/2019    No results found for this or any previous visit. No results found for this or any previous visit. No results found for this or any previous visit. No results found for this or any previous visit. Results for orders placed during the hospital encounter of 06/07/19  US RENAL   Narrative CLINICAL DATA:  Microscopic hematuria  EXAM: RENAL / URINARY TRACT ULTRASOUND COMPLETE  COMPARISON:  None  FINDINGS: Right Kidney:  Renal measurements: 12.3 x 6.4 x 6.8 cm = volume: 279 mL. Normal cortical thickness. Upper normal cortical echogenicity. Peripelvic cyst mid RIGHT kidney 19 x 24 x 20 mm. Additional exophytic lateral cyst upper LEFT kidney 21 x 18 x 24 mm. No solid mass, hydronephrosis or shadowing calcification.  Left Kidney:  Renal measurements: 11.5 x 6.9 x 6.4 cm = volume: 268 mL. Normal cortical thickness and echogenicity. No mass, hydronephrosis, or shadowing calcification. Incompletely distended. BILATERAL ureteral jets present. Question debris at bladder base versus minimally enlarged prostate gland.  Bladder:  Contains only minimal urine. BILATERAL ureteral jets present. Echogenic material at dependent bladder base question minimal prostatic enlargement versus dependent debris, suboptimally assessed due to inadequate bladder distention.  IMPRESSION: RIGHT renal cysts.   Question prostatic enlargement versus dependent debris within a poorly distended urinary bladder, suboptimally assessed.   Electronically Signed   By: Ulyses Southward M.D.   On: 06/07/2019 13:54    No results found for this or any previous visit. No results found for this or any previous visit. No results found for this or any previous visit.  Assessment & Plan:    Low T - continue clomiphene  ED - try sildenafil 20 mg tabs --> take 5 or 100 mg. Consider injections.   No follow-ups on file.  Jerilee Field, MD  [facility] [address] [place], [place] [address] [phone]

## 2019-11-17 ENCOUNTER — Ambulatory Visit: Payer: No Typology Code available for payment source | Admitting: Urology

## 2020-01-08 IMAGING — US US RENAL
1 series · 14 of 25 positions shown · non-contrast
Comparison: None

CLINICAL DATA: Microscopic hematuria

EXAM:
RENAL / URINARY TRACT ULTRASOUND COMPLETE

[Series 1: us renal · 0.28mm/px · 51 acquisitions, 14 frames shown]
[im 1/51]
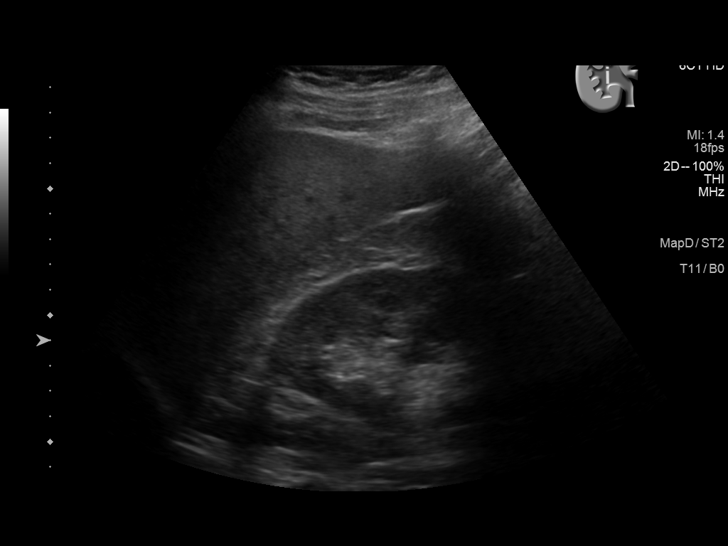
[im 5/51]
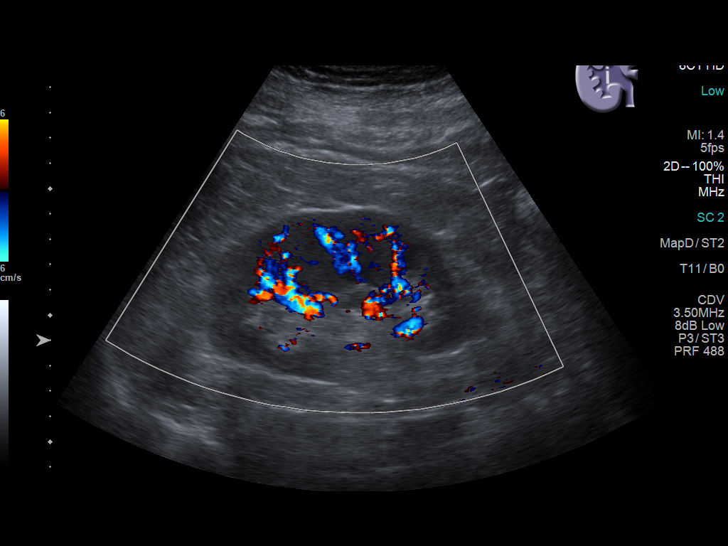
[im 9/51]
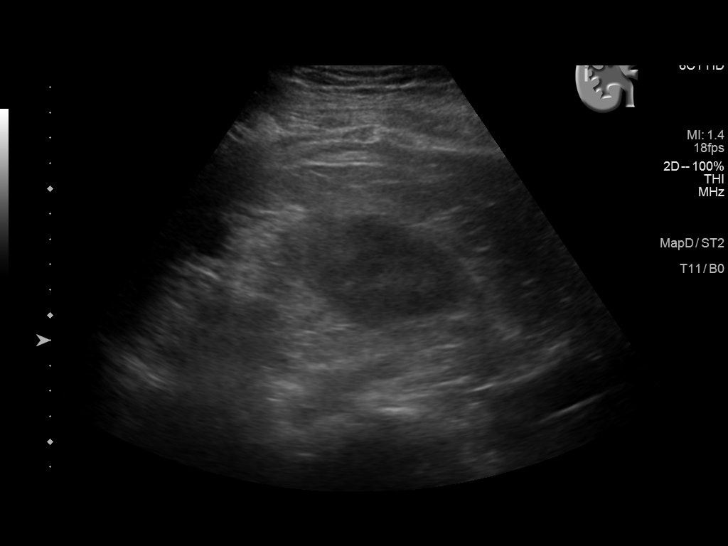
[im 13/51]
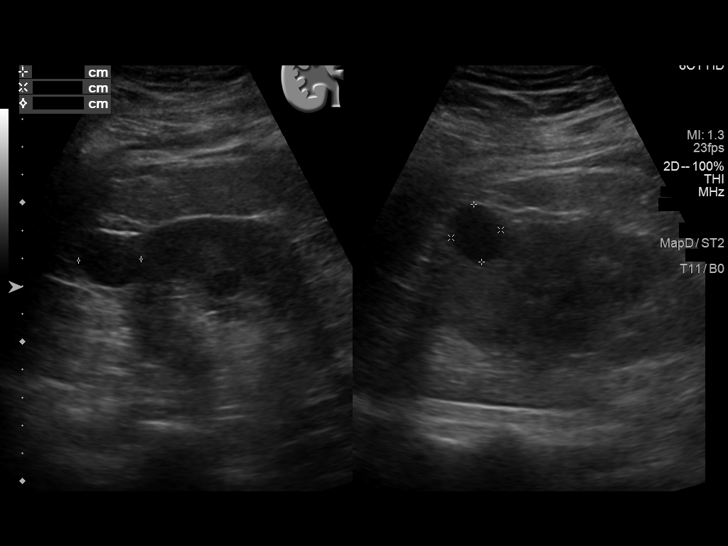
[im 17/51]
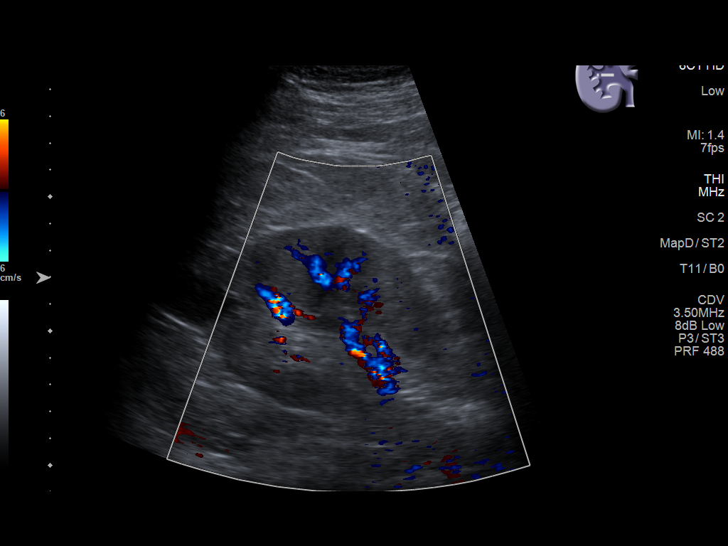
[im 19/51]
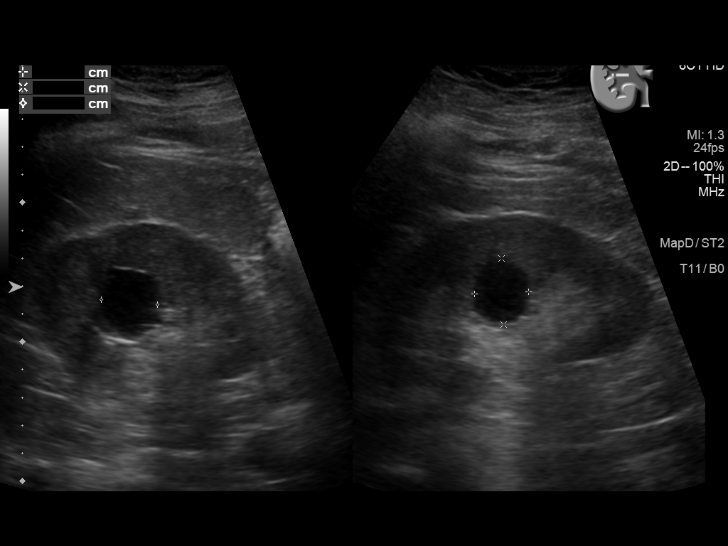
[im 23/51]
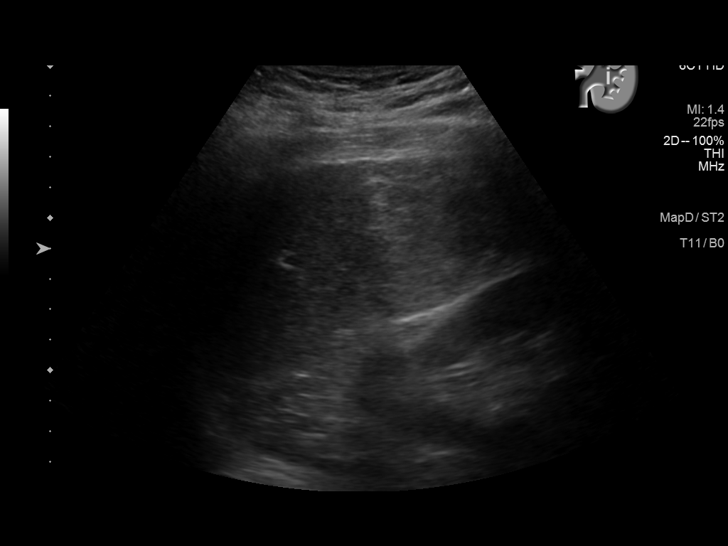
[im 28/51]
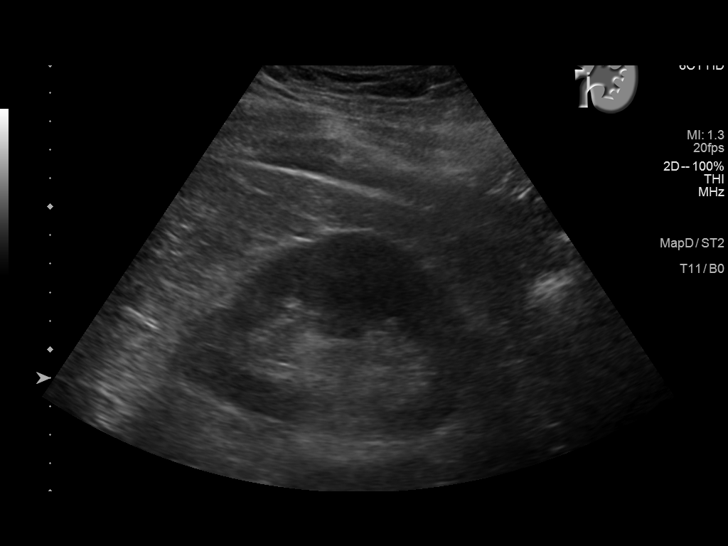
[im 32/51]
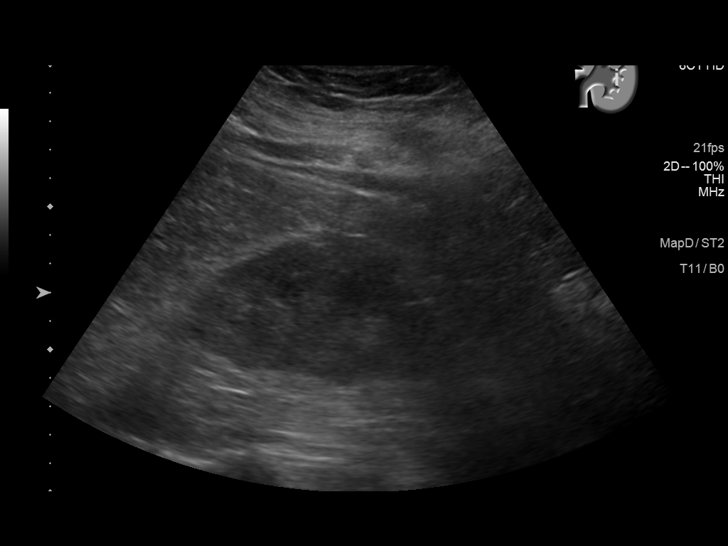
[im 34/51]
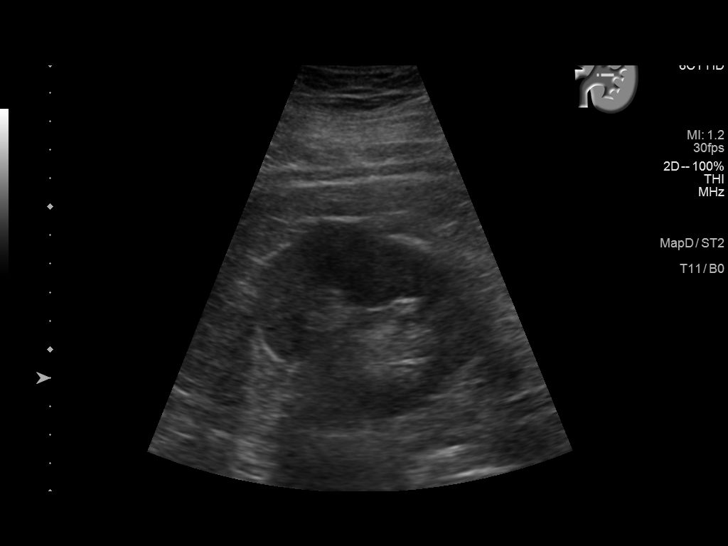
[im 38/51]
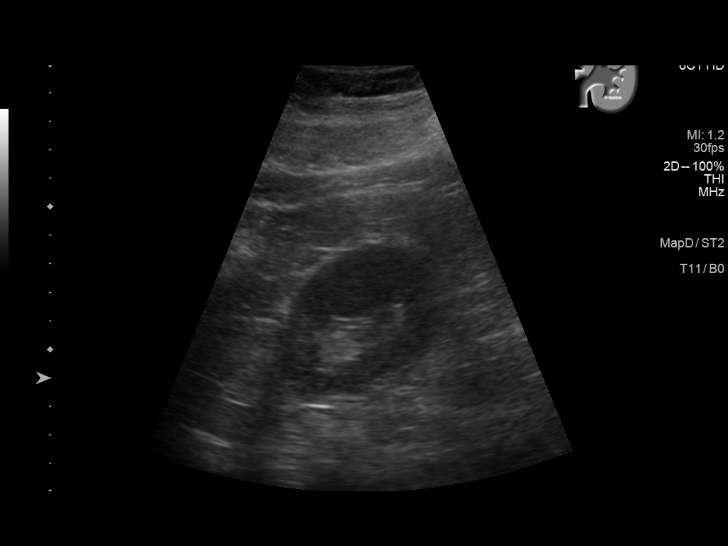
[im 42/51]
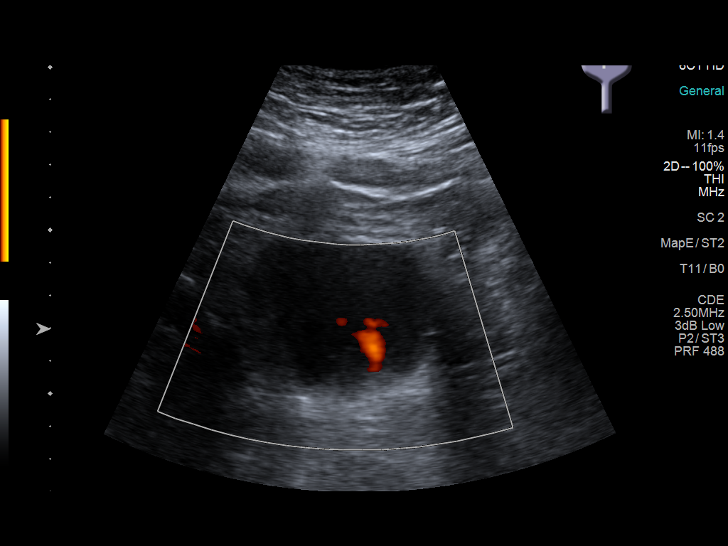
[im 46/51]
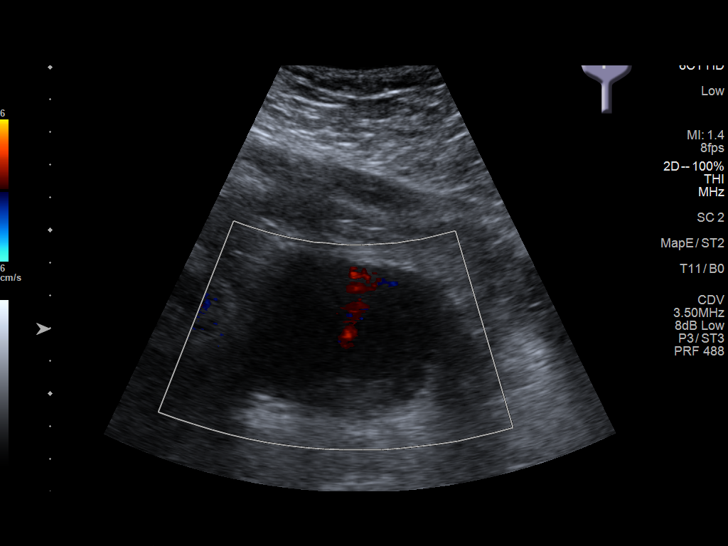
[im 51/51]
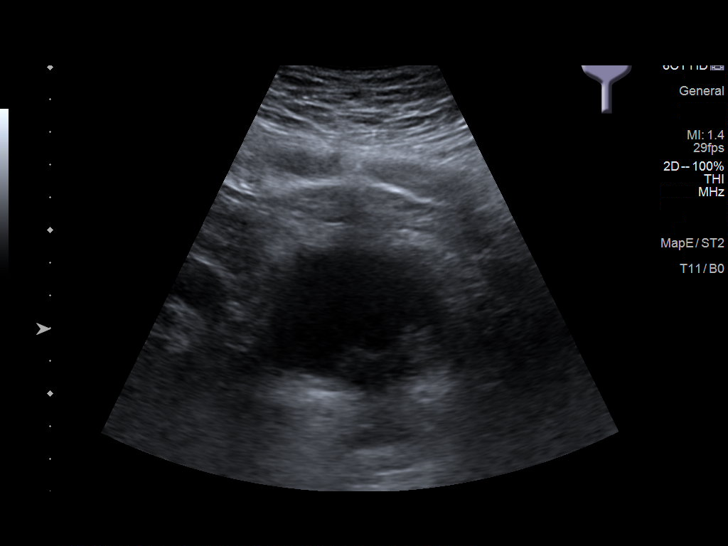

[14 of 25 positions shown; findings below may reference images not displayed]

FINDINGS: Right Kidney:

Renal measurements: 12.3 x 6.4 x 6.8 cm = volume: 279 mL. Normal
cortical thickness. Upper normal cortical echogenicity. Peripelvic
cyst mid RIGHT kidney 19 x 24 x 20 mm. Additional exophytic lateral
cyst upper LEFT kidney 21 x 18 x 24 mm. No solid mass,
hydronephrosis or shadowing calcification.

Left Kidney:

Renal measurements: 11.5 x 6.9 x 6.4 cm = volume: 268 mL. Normal
cortical thickness and echogenicity. No mass, hydronephrosis, or
shadowing calcification. Incompletely distended. BILATERAL ureteral
jets present. Question debris at bladder base versus minimally
enlarged prostate gland.

Bladder:

Contains only minimal urine. BILATERAL ureteral jets present.
Echogenic material at dependent bladder base question minimal
prostatic enlargement versus dependent debris, suboptimally assessed
due to inadequate bladder distention.
IMPRESSION: RIGHT renal cysts.

Question prostatic enlargement versus dependent debris within a
poorly distended urinary bladder, suboptimally assessed.

## 2020-02-22 ENCOUNTER — Ambulatory Visit: Payer: PRIVATE HEALTH INSURANCE | Attending: Neurology

## 2020-02-22 DIAGNOSIS — G4733 Obstructive sleep apnea (adult) (pediatric): Secondary | ICD-10-CM | POA: Insufficient documentation

## 2020-02-23 ENCOUNTER — Other Ambulatory Visit: Payer: Self-pay

## 2020-03-27 ENCOUNTER — Other Ambulatory Visit: Admission: RE | Admit: 2020-03-27 | Payer: PRIVATE HEALTH INSURANCE | Source: Ambulatory Visit

## 2020-03-28 ENCOUNTER — Other Ambulatory Visit
Admission: RE | Admit: 2020-03-28 | Discharge: 2020-03-28 | Disposition: A | Discharge status: Home / Self Care | Payer: HRSA Program | Source: Ambulatory Visit | Attending: Family Medicine | Admitting: Family Medicine

## 2020-03-28 DIAGNOSIS — Z20822 Contact with and (suspected) exposure to covid-19: Secondary | ICD-10-CM | POA: Insufficient documentation

## 2020-03-28 DIAGNOSIS — Z01812 Encounter for preprocedural laboratory examination: Secondary | ICD-10-CM | POA: Diagnosis not present

## 2020-03-28 LAB — SARS CORONAVIRUS 2 (TAT 6-24 HRS): SARS Coronavirus 2: NEGATIVE

## 2020-03-29 ENCOUNTER — Ambulatory Visit: Payer: PRIVATE HEALTH INSURANCE | Attending: Neurology

## 2020-03-29 DIAGNOSIS — G4733 Obstructive sleep apnea (adult) (pediatric): Secondary | ICD-10-CM | POA: Insufficient documentation

## 2020-03-30 ENCOUNTER — Other Ambulatory Visit: Payer: Self-pay

## 2020-04-28 ENCOUNTER — Other Ambulatory Visit: Payer: Self-pay

## 2020-04-28 ENCOUNTER — Ambulatory Visit (INDEPENDENT_AMBULATORY_CARE_PROVIDER_SITE_OTHER): Payer: No Typology Code available for payment source | Admitting: Urology

## 2020-04-28 VITALS — BP 157/101 | HR 90 | Ht 70.0 in | Wt 344.0 lb

## 2020-04-28 DIAGNOSIS — R7989 Other specified abnormal findings of blood chemistry: Secondary | ICD-10-CM

## 2020-04-28 DIAGNOSIS — N521 Erectile dysfunction due to diseases classified elsewhere: Secondary | ICD-10-CM | POA: Diagnosis not present

## 2020-04-28 MED ORDER — AMBULATORY NON FORMULARY MEDICATION: 0 refills | Status: DC

## 2020-04-28 NOTE — Progress Notes (Signed)
04/28/2020 9:53 AM   William Jimenez 10/06/1966 [id number]  Referring provider: Center, [facility] [address] [place],  [place] [address]  Chief Complaint  Patient presents with  . Follow-up    HPI:  F/u -   1) ED - history of low T, diabetes, obesity and erectile dysfunction. He tried 100 mg Viagra. He also tried Cialis and it gave him a heart burn and was not successful. Vardenafil was expensive. He continued sildenafil.   2) Low T - He has a low libido. He was givenT injectionsat some pt.  T was 196.  PSA 0.8.  Prolactin normal.  LH 6.4. Started on clomiphene. T rose to 420.   2)MH/dysuria, LUTS  - on tamsulosin. Urine culture grew less than 10,000 colony count April 2020. He has a good flow But some hesitancy. No gross hematuria. He tried tamsulosin but had a bothersome stuffy nose. He wasn't sureif tamsulosinhelped any. . Renal US was normal on 06/07/2019. He had right renal cysts. Cysto normal.   He returns in management of low T and ED. He ran out of clomiphene. Sildenafil not effective.    PMH: Past Medical History:  Diagnosis Date  . Hypertension     Surgical History: No past surgical history on file.  Home Medications:  Allergies as of 04/28/2020   No Known Allergies     Medication List       Accurate as of Apr 28, 2020  9:53 AM. If you have any questions, ask your nurse or doctor.        amlodipine-atorvastatin 2.5-20 MG tablet Commonly known as: CADUET Take 1 tablet by mouth daily.   clomiPHENE 50 MG tablet Commonly known as: CLOMID Take 1 tablet (50 mg total) by mouth See admin instructions. Take one tablet po Monday and one tablet po Thursday   lisinopril-hydrochlorothiazide 20-25 MG tablet Commonly known as: ZESTORETIC Take 1 tablet by mouth daily. for high blood pressure   meloxicam 15 MG tablet Commonly known as: MOBIC TAKE 1 TABLET BY MOUTH ONCE DAILY FOR PAIN   metFORMIN 500 MG tablet Commonly known as:  GLUCOPHAGE TAKE 1 TABLET BY MOUTH ONCE DAILY FOR DIABETES   pantoprazole 20 MG tablet Commonly known as: Protonix Take 1 tablet (20 mg total) by mouth daily.   pravastatin 10 MG tablet Commonly known as: PRAVACHOL Take 10 mg by mouth daily.   sildenafil 20 MG tablet Commonly known as: REVATIO Take 1 tablet (20 mg total) by mouth daily. Take 1-5 tablets as needed   tadalafil 5 MG tablet Commonly known as: CIALIS Take 2-4 tablets 1 hour as needed before intercourse.       Allergies: No Known Allergies  Family History: Family History  Problem Relation Age of Onset  . Prostate cancer Neg Hx   . Bladder Cancer Neg Hx   . Kidney cancer Neg Hx     Social History:  reports that he has never smoked. He uses smokeless tobacco. He reports that he does not drink alcohol or use drugs.   Physical Exam: BP (!) 157/101   Pulse 90   Ht 5\' 10"  (1.778 m)   Wt (!) 344 lb (156 kg)   BMI 49.36 kg/m   Constitutional:  Alert and oriented, No acute distress. HEENT: [place] AT, moist mucus membranes.  Trachea midline, no masses. Cardiovascular: No clubbing, cyanosis, or edema. Respiratory: Normal respiratory effort, no increased work of breathing. GI: Abdomen is soft, nontender, nondistended, no abdominal masses GU:  No CVA tenderness Skin: No rashes, bruises or suspicious lesions. Neurologic: Grossly intact, no focal deficits, moving all 4 extremities. Psychiatric: Normal mood and affect.  Laboratory Data: Lab Results  Component Value Date   WBC 8.2 07/03/2013   HGB 13.8 05/21/2019   HCT 42.9 05/21/2019   MCV 82 07/03/2013   PLT 291 07/03/2013    Lab Results  Component Value Date   CREATININE 1.21 07/03/2013    No results found for: PSA  Lab Results  Component Value Date   TESTOSTERONE 420 07/15/2019    No results found for: HGBA1C  Urinalysis    Component Value Date/Time   COLORURINE Yellow 07/03/2013 1011   APPEARANCEUR Clear 06/09/2019 1522   LABSPEC 1.026  07/03/2013 1011   PHURINE 5.0 07/03/2013 1011   GLUCOSEU Negative 06/09/2019 1522   GLUCOSEU Negative 07/03/2013 1011   HGBUR 2+ 07/03/2013 1011   BILIRUBINUR Negative 06/09/2019 1522   BILIRUBINUR Negative 07/03/2013 1011   KETONESUR Negative 07/03/2013 1011   PROTEINUR Negative 06/09/2019 1522   PROTEINUR Negative 07/03/2013 1011   NITRITE Negative 06/09/2019 1522   NITRITE Negative 07/03/2013 1011   LEUKOCYTESUR Negative 06/09/2019 1522   LEUKOCYTESUR Negative 07/03/2013 1011    Lab Results  Component Value Date   LABMICR See below: 06/09/2019   WBCUA 0-5 06/09/2019   LABEPIT None seen 06/09/2019   MUCUS Present (A) 06/09/2019   BACTERIA None seen 06/09/2019    Pertinent Imaging: N/a No results found for this or any previous visit. No results found for this or any previous visit. No results found for this or any previous visit. No results found for this or any previous visit. Results for orders placed during the hospital encounter of 06/07/19  US RENAL   Narrative CLINICAL DATA:  Microscopic hematuria  EXAM: RENAL / URINARY TRACT ULTRASOUND COMPLETE  COMPARISON:  None  FINDINGS: Right Kidney:  Renal measurements: 12.3 x 6.4 x 6.8 cm = volume: 279 mL. Normal cortical thickness. Upper normal cortical echogenicity. Peripelvic cyst mid RIGHT kidney 19 x 24 x 20 mm. Additional exophytic lateral cyst upper LEFT kidney 21 x 18 x 24 mm. No solid mass, hydronephrosis or shadowing calcification.  Left Kidney:  Renal measurements: 11.5 x 6.9 x 6.4 cm = volume: 268 mL. Normal cortical thickness and echogenicity. No mass, hydronephrosis, or shadowing calcification. Incompletely distended. BILATERAL ureteral jets present. Question debris at bladder base versus minimally enlarged prostate gland.  Bladder:  Contains only minimal urine. BILATERAL ureteral jets present. Echogenic material at dependent bladder base question minimal prostatic enlargement versus dependent  debris, suboptimally assessed due to inadequate bladder distention.  IMPRESSION: RIGHT renal cysts.  Question prostatic enlargement versus dependent debris within a poorly distended urinary bladder, suboptimally assessed.   Electronically Signed   By: Lavonia Dana M.D.   On: 06/07/2019 13:54    No results found for this or any previous visit. No results found for this or any previous visit. No results found for this or any previous visit.  Assessment & Plan:    Low T - send labs   ED - discussed some other options such as injections and Muse. We will send a Rx to [facility] - start at 20 mcg/ml. Discussed risk of priapism among others an how to deal with it.   No follow-ups on file.  Festus Aloe, MD  [facility] [address] [place], [place] [address] [phone]

## 2020-04-28 NOTE — Patient Instructions (Signed)
Alprostadil intracavernosal injection What is this medicine? ALPROSTADIL (al PROS ta dil) is used to treat erectile dysfunction (ED). This medicine helps to create and maintain an erection. This medicine may be used for other purposes; ask your health care provider or pharmacist if you have questions. COMMON BRAND NAME(S): Caverject, Caverject Impulse, Edex What should I tell my health care provider before I take this medicine? They need to know if you have any of these conditions:  an abnormally formed penis  have been advised not to engage in sexual activity  have ever had a painful erection that lasted more than 4 hours  heart problems  leukemia  low blood pressure  penile implant  sickle cell disease or trait  tumor of the bone marrow (multiple myeloma)  an unusual or allergic reaction to alprostadil or other medicines, foods, dyes, or preservatives How should I use this medicine? This medicine is for injection into the penis. You will be taught how to use this medicine. Use exactly as directed. Do not take your medicine more often than directed. It is important that you put your used needles and syringes in a special sharps container. Do not put them in a trash can. If you do not have a sharps container, call your pharmacist or healthcare provider to get one. This medicine is for use in men only and is not for use in children. Overdosage: If you think you have taken too much of this medicine contact a poison control center or emergency room at once. NOTE: This medicine is only for you. Do not share this medicine with others. What if I miss a dose? You should not use this medicine more than 3 times a week. Each dose should be given at least 24 hours apart. What may interact with this medicine?  medicines for blood pressure This list may not describe all possible interactions. Give your health care provider a list of all the medicines, herbs, non-prescription drugs, or  dietary supplements you use. Also tell them if you smoke, drink alcohol, or use illegal drugs. Some items may interact with your medicine. What should I watch for while using this medicine? Contact your doctor or health care professional right away if you have an erection that lasts longer than 4 hours or if it becomes painful. This may be a sign of a serious problem and must be treated right away to prevent permanent damage. Do not change the dose of your medication. Call your doctor or health care professional to determine if your dose needs to be changed. This medicine does not protect you or your partner against HIV infection (the virus that causes AIDS) or other sexually transmitted diseases. What side effects may I notice from receiving this medicine? Side effects that you should report to your doctor or health care professional as soon as possible:  allergic reactions like skin rash, itching or hives, swelling of the face, lips, or tongue  prolonged or painful erection Side effects that usually do not require medical attention (report to your doctor or health care professional if they continue or are bothersome):  bleeding, bruising, or pain at site of injection  change in blood pressure This list may not describe all possible side effects. Call your doctor for medical advice about side effects. You may report side effects to FDA at 1-800-FDA-1088. Where should I keep my medicine? Keep out of reach of children. Caverject: Store unopened product at or below 25 degrees C (77 degrees F). Do not freeze.  See product instructions for storage when product is in use. Different products may have different instructions for storage. Throw away any unused medicine after the expiration date. Edex: Store unopened product at room temperature between 15 and 30 degrees C (59 and 86 degrees F). Do not freeze. See product instructions for storage when product is in use. Throw away any unused medicine after  the expiration date. When traveling, do not store this medicine in checked luggage during air travel or leave in a closed automobile. NOTE: This sheet is a summary. It may not cover all possible information. If you have questions about this medicine, talk to your doctor, pharmacist, or health care provider.  2020 Elsevier/Gold Standard (2015-05-03 13:19:52)  

## 2020-05-03 LAB — TESTOSTERONE,FREE AND TOTAL
Testosterone, Free: 7.4 pg/mL (ref 7.2–24.0)
Testosterone: 219 ng/dL — ABNORMAL LOW (ref 264–916)

## 2020-05-03 LAB — PSA: Prostate Specific Ag, Serum: 0.6 ng/mL (ref 0.0–4.0)

## 2020-05-03 LAB — LUTEINIZING HORMONE: LH: 6.2 m[IU]/mL (ref 1.7–8.6)

## 2020-05-04 MED ORDER — CLOMIPHENE CITRATE 50 MG PO TABS: 50.0000 mg | ORAL_TABLET | ORAL | 5 refills | Status: DC

## 2020-05-04 NOTE — Addendum Note (Signed)
Addended by: Jerilee Field R on: 05/04/2020 08:22 PM   Modules accepted: Orders

## 2020-05-05 ENCOUNTER — Telehealth: Payer: Self-pay

## 2020-05-05 NOTE — Telephone Encounter (Signed)
-----   Message from [place], [place] sent at 05/05/2020 10:28 AM EDT -----  ----- Message ----- From: Levada Schilling, CMA Sent: 05/05/2020   7:32 AM EDT To: Veneta Penton, CMA   ----- Message ----- From: Jerilee Field, MD Sent: 05/04/2020   8:22 PM EDT To: Levada Schilling, CMA  Shanda Bumps - let Mena know his testosterone is low and I sent a new Rx for clomiphene to take Mondays and Thursdays to boost his testosterone. Thanks.  ----- Message ----- From: Levada Schilling, CMA Sent: 05/03/2020   7:57 AM EDT To: Jerilee Field, MD   ----- Message ----- From: Nell Range Lab Results In Sent: 04/29/2020   7:38 AM EDT To: Jennette Kettle [facility]

## 2020-05-05 NOTE — Telephone Encounter (Signed)
Notified patient as advised. Patient agreed to picking up Rx and agreed to plan.

## 2020-05-18 ENCOUNTER — Ambulatory Visit: Payer: Self-pay | Admitting: Urology

## 2020-05-18 ENCOUNTER — Encounter: Payer: Self-pay | Admitting: Urology

## 2020-05-23 ENCOUNTER — Encounter: Payer: Self-pay | Admitting: Urology

## 2020-05-23 ENCOUNTER — Ambulatory Visit (INDEPENDENT_AMBULATORY_CARE_PROVIDER_SITE_OTHER): Payer: No Typology Code available for payment source | Admitting: Urology

## 2020-05-23 ENCOUNTER — Other Ambulatory Visit: Payer: Self-pay

## 2020-05-23 DIAGNOSIS — N5201 Erectile dysfunction due to arterial insufficiency: Secondary | ICD-10-CM

## 2020-05-24 NOTE — Progress Notes (Signed)
Patient could not afford the Trimix medication.

## 2020-08-04 ENCOUNTER — Ambulatory Visit: Payer: Self-pay | Admitting: Urology

## 2020-12-27 ENCOUNTER — Encounter: Payer: Self-pay | Admitting: Emergency Medicine

## 2020-12-27 ENCOUNTER — Other Ambulatory Visit: Payer: Self-pay

## 2020-12-27 ENCOUNTER — Emergency Department
Admission: EM | Admit: 2020-12-27 | Discharge: 2020-12-27 | Disposition: A | Discharge status: Home / Self Care | Payer: No Typology Code available for payment source | Attending: Emergency Medicine | Admitting: Emergency Medicine

## 2020-12-27 DIAGNOSIS — I10 Essential (primary) hypertension: Secondary | ICD-10-CM | POA: Diagnosis not present

## 2020-12-27 DIAGNOSIS — L03116 Cellulitis of left lower limb: Secondary | ICD-10-CM | POA: Insufficient documentation

## 2020-12-27 DIAGNOSIS — L03115 Cellulitis of right lower limb: Secondary | ICD-10-CM

## 2020-12-27 DIAGNOSIS — Z79899 Other long term (current) drug therapy: Secondary | ICD-10-CM | POA: Insufficient documentation

## 2020-12-27 DIAGNOSIS — M79604 Pain in right leg: Secondary | ICD-10-CM | POA: Diagnosis present

## 2020-12-27 LAB — COMPREHENSIVE METABOLIC PANEL
ALT: 31 U/L (ref 0–44)
AST: 26 U/L (ref 15–41)
Albumin: 3.4 g/dL — ABNORMAL LOW (ref 3.5–5.0)
Alkaline Phosphatase: 61 U/L (ref 38–126)
Anion gap: 11 (ref 5–15)
BUN: 10 mg/dL (ref 6–20)
CO2: 29 mmol/L (ref 22–32)
Calcium: 8.4 mg/dL — ABNORMAL LOW (ref 8.9–10.3)
Chloride: 98 mmol/L (ref 98–111)
Creatinine, Ser: 1.12 mg/dL (ref 0.61–1.24)
GFR, Estimated: >60 mL/min (ref >60)
Glucose, Bld: 146 mg/dL — ABNORMAL HIGH (ref 70–99)
Potassium: 3.4 mmol/L — ABNORMAL LOW (ref 3.5–5.1)
Sodium: 138 mmol/L (ref 135–145)
Total Bilirubin: 0.8 mg/dL (ref 0.3–1.2)
Total Protein: 7.2 g/dL (ref 6.5–8.1)

## 2020-12-27 LAB — CBC WITH DIFFERENTIAL/PLATELET
Abs Immature Granulocytes: 0.17 10*3/uL — ABNORMAL HIGH (ref 0.00–0.07)
Basophils Absolute: 0 10*3/uL (ref 0.0–0.1)
Basophils Relative: 0 %
Eosinophils Absolute: 0 10*3/uL (ref 0.0–0.5)
Eosinophils Relative: 0 %
HCT: 42.1 % (ref 39.0–52.0)
Hemoglobin: 13.6 g/dL (ref 13.0–17.0)
Immature Granulocytes: 1 %
Lymphocytes Relative: 9 %
Lymphs Abs: 1.4 10*3/uL (ref 0.7–4.0)
MCH: 25.5 pg — ABNORMAL LOW (ref 26.0–34.0)
MCHC: 32.3 g/dL (ref 30.0–36.0)
MCV: 79 fL — ABNORMAL LOW (ref 80.0–100.0)
Monocytes Absolute: 1.6 10*3/uL — ABNORMAL HIGH (ref 0.1–1.0)
Monocytes Relative: 10 %
Neutro Abs: 12.4 10*3/uL — ABNORMAL HIGH (ref 1.7–7.7)
Neutrophils Relative %: 80 %
Platelets: 310 10*3/uL (ref 150–400)
RBC: 5.33 MIL/uL (ref 4.22–5.81)
RDW: 14.7 % (ref 11.5–15.5)
WBC: 15.7 10*3/uL — ABNORMAL HIGH (ref 4.0–10.5)
nRBC: 0 % (ref 0.0–0.2)

## 2020-12-27 MED ORDER — HYDROCODONE-ACETAMINOPHEN 5-325 MG PO TABS: 1.0000 | ORAL_TABLET | Freq: Four times a day (QID) | ORAL | 0 refills | Status: DC | PRN

## 2020-12-27 MED ORDER — CLINDAMYCIN HCL 150 MG PO CAPS
ORAL_CAPSULE | ORAL | 0 refills | Status: DC
Start: 2020-12-27 — End: 2020-12-27

## 2020-12-27 MED ORDER — CLINDAMYCIN HCL 150 MG PO CAPS: ORAL_CAPSULE | ORAL | 0 refills | Status: DC

## 2020-12-27 MED ORDER — CLINDAMYCIN PHOSPHATE 600 MG/50ML IV SOLN
600.0000 mg | Freq: Once | INTRAVENOUS | Status: AC
  Administered 2020-12-27: 600 mg via INTRAVENOUS
  Filled 2020-12-27: qty 50

## 2020-12-27 NOTE — ED Provider Notes (Signed)
[facility] Emergency Department Provider Note   ____________________________________________   Event Date/Time   First MD Initiated Contact with Patient 12/27/20 1111     (approximate)  I have reviewed the triage vital signs and the nursing notes.   HISTORY  Chief Complaint Leg Swelling   HPI William Jimenez is a 55 y.o. male presents to the ED with complaint of right lower leg redness and a scab over the area which made him think that he had been bitten by something.  Patient states that all this started approximately 1 week ago.  He has had some drainage from the area but not recently.  Patient denies any fever, chills, nausea or vomiting.  Patient denies any injury to his leg.  Currently he denies any pain.       Past Medical History:  Diagnosis Date  . Hypertension     There are no problems to display for this patient.   History reviewed. No pertinent surgical history.  Prior to Admission medications   Medication Sig Start Date End Date Taking? Authorizing Provider  amlodipine-atorvastatin (CADUET) 2.5-20 MG tablet Take 1 tablet by mouth daily.   Yes [provider]  HYDROcodone-acetaminophen (NORCO/VICODIN) 5-325 MG tablet Take 1 tablet by mouth every 6 (six) hours as needed for moderate pain. 12/27/20  Yes Tommi Rumps, PA-C  lisinopril-hydrochlorothiazide (ZESTORETIC) 20-25 MG tablet Take 1 tablet by mouth daily. for high blood pressure 04/19/19  Yes [provider]  AMBULATORY NON FORMULARY MEDICATION Prostaglandin  Test Dose  31ml vial   Qty #5 Refills 0  [facility] [phone] Fax [phone] 04/28/20   Jerilee Field, MD  clindamycin (CLEOCIN) 150 MG capsule 2 capsules every 8 hours for infection 12/27/20   Tommi Rumps, PA-C  clomiPHENE (CLOMID) 50 MG tablet Take 1 tablet (50 mg total) by mouth See admin instructions. Take one tablet po Monday and one tablet po Thursday 05/04/20   Jerilee Field, MD  meloxicam (MOBIC) 15 MG tablet TAKE 1 TABLET BY MOUTH ONCE DAILY FOR PAIN 03/12/19   [provider]  metFORMIN (GLUCOPHAGE) 500 MG tablet TAKE 1 TABLET BY MOUTH ONCE DAILY FOR DIABETES 03/12/19   [provider]  pantoprazole (PROTONIX) 20 MG tablet Take 1 tablet (20 mg total) by mouth daily. 12/15/16 12/15/17  Willy Eddy, MD  pravastatin (PRAVACHOL) 10 MG tablet Take 10 mg by mouth daily.    [provider]  sildenafil (REVATIO) 20 MG tablet Take 1 tablet (20 mg total) by mouth daily. Take 1-5 tablets as needed 09/08/19   Jerilee Field, MD  tadalafil (CIALIS) 5 MG tablet Take 2-4 tablets 1 hour as needed before intercourse. 07/22/19   Jerilee Field, MD    Allergies Patient has no known allergies.  Family History  Problem Relation Age of Onset  . Prostate cancer Neg Hx   . Bladder Cancer Neg Hx   . Kidney cancer Neg Hx     Social History Social History   Tobacco Use  . Smoking status: Never Smoker  . Smokeless tobacco: Current User  Substance Use Topics  . Alcohol use: No  . Drug use: No    Review of Systems Constitutional: No fever/chills Eyes: No visual changes. ENT: No sore throat. Cardiovascular: Denies chest pain. Respiratory: Denies shortness of breath. Gastrointestinal: No abdominal pain.  No nausea, no vomiting.   Musculoskeletal: Negative for muscle aches. Skin: Right lower leg redness. Neurological: Negative for headaches, focal weakness or numbness.  ____________________________________________   PHYSICAL EXAM:  VITAL SIGNS: ED Triage Vitals  Enc Vitals Group     BP 12/27/20 1042 129/78     Pulse Rate 12/27/20 1042 (!) 102     Resp 12/27/20 1042 18     Temp 12/27/20 1042 99.7 F (37.6 C)     Temp Source 12/27/20 1042 Oral     SpO2 12/27/20 1042 97 %     Weight 12/27/20 1033 (!) 343 lb 14.7 oz (156 kg)     Height 12/27/20 1033 5\' 10"  (1.778 m)     Head Circumference --      Peak Flow --      Pain  Score 12/27/20 1033 0     Pain Loc --      Pain Edu? --      Excl. in GC? --     Constitutional: Alert and oriented. Well appearing and in no acute distress. Eyes: Conjunctivae are normal. PERRL. EOMI. Head: Atraumatic. Neck: No stridor.   Cardiovascular: Normal rate, regular rhythm. Grossly normal heart sounds.  Good peripheral circulation. Respiratory: Normal respiratory effort.  No retractions. Lungs CTAB. Musculoskeletal: Moves upper and lower extremities any difficulty.  Normal gait was noted.  No effusion noted right lower extremity.  Pulses are present both PT and DP.  Motor and sensory function intact. Neurologic:  Normal speech and language. No gross focal neurologic deficits are appreciated.  Skin: Right lower extremity anterior tib-fib area is erythematous and warm to touch.  There is 1 lesion that appears to be an old scab where patient states he had minimal amount of drainage.  Cellulitic area does not involve the foot and does not go above the right knee.  No active drainage is noted at this time.  Area is tender to touch. Psychiatric: Mood and affect are normal. Speech and behavior are normal.  ____________________________________________   LABS (all labs ordered are listed, but only abnormal results are displayed)  Labs Reviewed  CBC WITH DIFFERENTIAL/PLATELET - Abnormal; Notable for the following components:      Result Value   WBC 15.7 (*)    MCV 79.0 (*)    MCH 25.5 (*)    Neutro Abs 12.4 (*)    Monocytes Absolute 1.6 (*)    Abs Immature Granulocytes 0.17 (*)    All other components within normal limits  COMPREHENSIVE METABOLIC PANEL - Abnormal; Notable for the following components:   Potassium 3.4 (*)    Glucose, Bld 146 (*)    Calcium 8.4 (*)    Albumin 3.4 (*)    All other components within normal limits    PROCEDURES  Procedure(s) performed (including Critical Care):  Procedures   ____________________________________________   INITIAL  IMPRESSION / ASSESSMENT AND PLAN / ED COURSE  As part of my medical decision making, I reviewed the following data within the electronic MEDICAL RECORD NUMBER Notes from prior ED visits and [place] Controlled Substance Database  55 year old male presents to the ED with complaint of swollen area to his right lower leg for approximately 1 week.  He states that initially there was some drainage which is now scabbed over.  He denies any injury or known insect bite.  He denies any fever or chills.  Patient in the past has been treated for diabetes with metformin but at this time his blood sugars have remained in control and his PCP discontinued the metformin.  Physical exam is consistent with a right lower leg cellulitis on the anterior portion of  his leg.  WBC was elevated at 15.7 and patient was made aware that his blood sugar nonfasting was 146.  He is to follow-up with his PCP and also encouraged to have his blood pressure rechecked as the elevation may be due to infection.  Patient received clindamycin 600 mg IV while in the emergency department.  A prescription for clindamycin and Norco was sent to his pharmacy.  He is aware that he needs to take the antibiotic until completely finished.  We discussed using warm moist compresses and elevating his leg often.  Patient is return to the emergency department if any severe worsening of his symptoms or urgent concerns.   ____________________________________________   FINAL CLINICAL IMPRESSION(S) / ED DIAGNOSES  Final diagnoses:  Cellulitis of right leg without foot     ED Discharge Orders         Ordered    clindamycin (CLEOCIN) 150 MG capsule  Status:  Discontinued        12/27/20 1417    HYDROcodone-acetaminophen (NORCO/VICODIN) 5-325 MG tablet  Every 6 hours PRN        12/27/20 1417    clindamycin (CLEOCIN) 150 MG capsule        12/27/20 1420          *Please note:  William Jimenez was evaluated in Emergency Department on 12/27/2020 for the symptoms  described in the history of present illness. He was evaluated in the context of the global COVID-19 pandemic, which necessitated consideration that the patient might be at risk for infection with the SARS-CoV-2 virus that causes COVID-19. Institutional protocols and algorithms that pertain to the evaluation of patients at risk for COVID-19 are in a state of rapid change based on information released by regulatory bodies including the CDC and federal and state organizations. These policies and algorithms were followed during the patient's care in the ED.  Some ED evaluations and interventions may be delayed as a result of limited staffing during and the pandemic.*   Note:  This document was prepared using Dragon voice recognition software and may include unintentional dictation errors.    Tommi Rumps, PA-C 12/27/20 1432    Gilles Chiquito, MD 12/27/20 [phone]

## 2020-12-27 NOTE — ED Notes (Signed)
Right lower leg has scab over swollen area for about a week.  Does not know about fever.  It has not drained.  Does not know about any injry or bite.

## 2020-12-27 NOTE — Discharge Instructions (Signed)
Follow-up with your primary care provider if any continued problems or concerns.  Also have your medical provider recheck your blood sugar as it was elevated at 146 today with you being fasting.  It may be necessary for you to restart your metformin but this should be discussed with your primary care provider.  A prescription for antibiotics was sent to your pharmacy along with something for pain if needed.  Continue using warm moist compresses such as a washcloth to your leg and elevate often.  You may see drainage from your leg in the area where the scab is.  Return to the emergency department if any worsening of your symptoms such as increase in redness, high fever, chills or not improving.

## 2020-12-27 NOTE — ED Notes (Signed)
Says no longer on metformin as his sugar and a 1 c have been better.

## 2020-12-27 NOTE — ED Triage Notes (Signed)
Right leg pain and swelling x 3 days. Unsure if there was an insect bite or injury, but small circular scabbed area seen to right shin.  Swelling noted around area, warm to touch.  NAD

## 2021-03-20 ENCOUNTER — Emergency Department
Admission: EM | Admit: 2021-03-20 | Discharge: 2021-03-20 | Disposition: A | Discharge status: Home / Self Care | Payer: No Typology Code available for payment source | Attending: Emergency Medicine | Admitting: Emergency Medicine

## 2021-03-20 ENCOUNTER — Emergency Department: Payer: No Typology Code available for payment source

## 2021-03-20 ENCOUNTER — Other Ambulatory Visit: Payer: Self-pay

## 2021-03-20 DIAGNOSIS — R609 Edema, unspecified: Secondary | ICD-10-CM | POA: Insufficient documentation

## 2021-03-20 DIAGNOSIS — Z79899 Other long term (current) drug therapy: Secondary | ICD-10-CM | POA: Diagnosis not present

## 2021-03-20 DIAGNOSIS — I10 Essential (primary) hypertension: Secondary | ICD-10-CM | POA: Diagnosis not present

## 2021-03-20 DIAGNOSIS — R600 Localized edema: Secondary | ICD-10-CM

## 2021-03-20 LAB — CBC WITH DIFFERENTIAL/PLATELET
Abs Immature Granulocytes: 0.07 10*3/uL (ref 0.00–0.07)
Basophils Absolute: 0 10*3/uL (ref 0.0–0.1)
Basophils Relative: 1 %
Eosinophils Absolute: 0.1 10*3/uL (ref 0.0–0.5)
Eosinophils Relative: 2 %
HCT: 40.9 % (ref 39.0–52.0)
Hemoglobin: 13.8 g/dL (ref 13.0–17.0)
Immature Granulocytes: 1 %
Lymphocytes Relative: 32 %
Lymphs Abs: 2 10*3/uL (ref 0.7–4.0)
MCH: 26.7 pg (ref 26.0–34.0)
MCHC: 33.7 g/dL (ref 30.0–36.0)
MCV: 79.3 fL — ABNORMAL LOW (ref 80.0–100.0)
Monocytes Absolute: 0.7 10*3/uL (ref 0.1–1.0)
Monocytes Relative: 12 %
Neutro Abs: 3.3 10*3/uL (ref 1.7–7.7)
Neutrophils Relative %: 52 %
Platelets: 345 10*3/uL (ref 150–400)
RBC: 5.16 MIL/uL (ref 4.22–5.81)
RDW: 15.6 % — ABNORMAL HIGH (ref 11.5–15.5)
WBC: 6.2 10*3/uL (ref 4.0–10.5)
nRBC: 0 % (ref 0.0–0.2)

## 2021-03-20 LAB — BASIC METABOLIC PANEL
Anion gap: 10 (ref 5–15)
BUN: 11 mg/dL (ref 6–20)
CO2: 25 mmol/L (ref 22–32)
Calcium: 8.9 mg/dL (ref 8.9–10.3)
Chloride: 102 mmol/L (ref 98–111)
Creatinine, Ser: 1.08 mg/dL (ref 0.61–1.24)
GFR, Estimated: >60 mL/min (ref >60)
Glucose, Bld: 142 mg/dL — ABNORMAL HIGH (ref 70–99)
Potassium: 3.8 mmol/L (ref 3.5–5.1)
Sodium: 137 mmol/L (ref 135–145)

## 2021-03-20 MED ORDER — FUROSEMIDE 20 MG PO TABS: 20.0000 mg | ORAL_TABLET | Freq: Every day | ORAL | 11 refills | Status: DC

## 2021-03-20 NOTE — ED Triage Notes (Signed)
Pt arrives via pov, ambulatory with steady gait to triage. C/o lower rt leg pain. Pt reports insect bite in January in which he was treated with antibiotics. Pt reports leg swelling and pain since insect bite. No obvious signs pf infection noted on assessment at this time. NAD noted.

## 2021-03-20 NOTE — ED Provider Notes (Signed)
[facility] Emergency Department Provider Note   ____________________________________________   Event Date/Time   First MD Initiated Contact with Patient 03/20/21 [phone]     (approximate)  I have reviewed the triage vital signs and the nursing notes.   HISTORY  Chief Complaint Leg Pain    HPI William Jimenez is a 55 y.o. male patient presents with 55-month intermitting right lower leg edema.  Onset of complaints started in January 2022 status post insect bite which was treated as cellulitis.  Patient to complete antibiotic oral course but noticed no improvement in the edema.  Noticed redness around site of insect bite.  No active drainage.  Denies pain.      Past Medical History:  Diagnosis Date  . Hypertension     There are no problems to display for this patient.   History reviewed. No pertinent surgical history.  Prior to Admission medications   Medication Sig Start Date End Date Taking? Authorizing Provider  furosemide (LASIX) 20 MG tablet Take 1 tablet (20 mg total) by mouth daily. 03/20/21 03/20/22 Yes Joni Reining, PA-C  AMBULATORY NON FORMULARY MEDICATION Prostaglandin  Test Dose  54ml vial   Qty #5 Refills 0  [facility] [phone] Fax [phone] 04/28/20   Jerilee Field, MD  amlodipine-atorvastatin (CADUET) 2.5-20 MG tablet Take 1 tablet by mouth daily.    [provider]  clomiPHENE (CLOMID) 50 MG tablet Take 1 tablet (50 mg total) by mouth See admin instructions. Take one tablet po Monday and one tablet po Thursday 05/04/20   Jerilee Field, MD  lisinopril-hydrochlorothiazide (ZESTORETIC) 20-25 MG tablet Take 1 tablet by mouth daily. for high blood pressure 04/19/19   [provider]  meloxicam (MOBIC) 15 MG tablet TAKE 1 TABLET BY MOUTH ONCE DAILY FOR PAIN 03/12/19   [provider]  metFORMIN (GLUCOPHAGE) 500 MG tablet TAKE 1 TABLET BY MOUTH ONCE DAILY FOR DIABETES 03/12/19   [provider]  pantoprazole (PROTONIX) 20 MG tablet Take 1 tablet (20 mg total) by mouth daily. 12/15/16 12/15/17  Willy Eddy, MD  sildenafil (REVATIO) 20 MG tablet Take 1 tablet (20 mg total) by mouth daily. Take 1-5 tablets as needed 09/08/19   Jerilee Field, MD  tadalafil (CIALIS) 5 MG tablet Take 2-4 tablets 1 hour as needed before intercourse. 07/22/19   Jerilee Field, MD    Allergies Patient has no known allergies.  Family History  Problem Relation Age of Onset  . Prostate cancer Neg Hx   . Bladder Cancer Neg Hx   . Kidney cancer Neg Hx     Social History Social History   Tobacco Use  . Smoking status: Never Smoker  . Smokeless tobacco: Current User  Substance Use Topics  . Alcohol use: No  . Drug use: No    Review of Systems Constitutional: No fever/chills Eyes: No visual changes. ENT: No sore throat. Cardiovascular: Denies chest pain. Respiratory: Denies shortness of breath. Gastrointestinal: No abdominal pain.  No nausea, no vomiting.  No diarrhea.  No constipation. Genitourinary: Negative for dysuria. Musculoskeletal: Right lower leg edema. Skin: Negative for rash. Neurological: Negative for headaches, focal weakness or numbness. Endocrine:  Erectile dysfunction, upper lipidemia, and hypertension.  ____________________________________________   PHYSICAL EXAM:  VITAL SIGNS: ED Triage Vitals  Enc Vitals Group     BP 03/20/21 0718 (!) 158/101     Pulse Rate 03/20/21 0718 88     Resp 03/20/21 0718 18     Temp  03/20/21 0718 (!) 97 F (36.1 C)     Temp Source 03/20/21 0718 Oral     SpO2 03/20/21 0718 98 %     Weight 03/20/21 0719 298 lb (135.2 kg)     Height 03/20/21 0719 5\' 10"  (1.778 m)     Head Circumference --      Peak Flow --      Pain Score 03/20/21 0719 0     Pain Loc --      Pain Edu? --      Excl. in GC? --    Constitutional: Alert and oriented. Well appearing and in no acute distress.  BMI is 42.76 Eyes: Conjunctivae are  normal. PERRL. EOMI. Head: Atraumatic. Nose: No congestion/rhinnorhea. Mouth/Throat: Mucous membranes are moist.  Oropharynx non-erythematous. Neck: No stridor. Hematological/Lymphatic/Immunilogical: No cervical lymphadenopathy. Cardiovascular: Normal rate, regular rhythm. Grossly normal heart sounds.  Good peripheral circulation.  Elevated blood pressure.  No pitting edema. Respiratory: Normal respiratory effort.  No retractions. Lungs CTAB. Gastrointestinal: Soft and nontender. No distention. No abdominal bruits. No CVA tenderness. Genitourinary: Deferred Musculoskeletal: No obvious deformity to the left lower leg.  Circumference of the calf on the right side measures 46.5 cm versus 45 cm of the left calf. Neurologic:  Normal speech and language. No gross focal neurologic deficits are appreciated. No gait instability. Skin:  Skin is warm, dry and intact. No rash noted. Psychiatric: Mood and affect are normal. Speech and behavior are normal.  ____________________________________________   LABS (all labs ordered are listed, but only abnormal results are displayed)  Labs Reviewed  CBC WITH DIFFERENTIAL/PLATELET - Abnormal; Notable for the following components:      Result Value   MCV 79.3 (*)    RDW 15.6 (*)    All other components within normal limits  BASIC METABOLIC PANEL - Abnormal; Notable for the following components:   Glucose, Bld 142 (*)    All other components within normal limits   ____________________________________________  EKG   ____________________________________________  RADIOLOGY I, 05/20/21, personally viewed and evaluated these images (plain radiographs) as part of my medical decision making, as well as reviewing the written report by the radiologist.  ED MD interpretation:    Official radiology report(s): Joni Reining Venous Img Lower Unilateral Right  Result Date: 03/20/2021 CLINICAL DATA:  Progressive right lower extremity edema EXAM: RIGHT LOWER  EXTREMITY VENOUS DOPPLER ULTRASOUND TECHNIQUE: Gray-scale sonography with compression, as well as color and duplex ultrasound, were performed to evaluate the deep venous system(s) from the level of the common femoral vein through the popliteal and proximal calf veins. COMPARISON:  None. FINDINGS: VENOUS Normal compressibility of the common femoral, superficial femoral, and popliteal veins, as well as the visualized calf veins. Visualized portions of profunda femoral vein and great saphenous vein unremarkable. No filling defects to suggest DVT on grayscale or color Doppler imaging. Doppler waveforms show normal direction of venous flow, normal respiratory plasticity and response to augmentation. Limited views of the contralateral common femoral vein are unremarkable. OTHER None. Limitations: none IMPRESSION: Negative. Electronically Signed   By: 05/20/2021 M.D.   On: 03/20/2021 09:47    ____________________________________________   PROCEDURES  Procedure(s) performed (including Critical Care):  Procedures   ____________________________________________   INITIAL IMPRESSION / ASSESSMENT AND PLAN / ED COURSE  As part of my medical decision making, I reviewed the following data within the electronic MEDICAL RECORD NUMBER         Patient presents with right leg edema involving the calf  for approximately 2 to 3 months.  Onset of complaint was after cellulitis which was treated in January 2022.  Discussed no acute findings or DVT on ultrasound of the right leg.  Patient complaint and physical exam consistent with peripheral edema.  Patient given discharge care instruction advised follow-up with PCP (7 to 10 days.  Patient given prescription for Lasix 20 mg to take daily.  Patient advised to take daily measures of his calf and take the readings to his follow-up visit.  Return back to ED if condition worsens.      ____________________________________________   FINAL CLINICAL IMPRESSION(S) / ED  DIAGNOSES  Final diagnoses:  Mild peripheral edema     ED Discharge Orders         Ordered    furosemide (LASIX) 20 MG tablet  Daily        03/20/21 1001          *Please note:  William Jimenez was evaluated in Emergency Department on 03/20/2021 for the symptoms described in the history of present illness. He was evaluated in the context of the global COVID-19 pandemic, which necessitated consideration that the patient might be at risk for infection with the SARS-CoV-2 virus that causes COVID-19. Institutional protocols and algorithms that pertain to the evaluation of patients at risk for COVID-19 are in a state of rapid change based on information released by regulatory bodies including the CDC and federal and state organizations. These policies and algorithms were followed during the patient's care in the ED.  Some ED evaluations and interventions may be delayed as a result of limited staffing during and the pandemic.*   Note:  This document was prepared using Dragon voice recognition software and may include unintentional dictation errors.    Joni Reining, PA-C 03/20/21 1007    Jene Every, MD 03/20/21 1105

## 2021-03-20 NOTE — Discharge Instructions (Addendum)
Ultrasound was negative for DVT or lymphedema.  Change previous medication start Lasix daily for the next 10 days.  Measure your calf daily for 10 days.  Take the readings to your follow-up appointment with your PCP.  Your calf measurements today was left at 18-1/4 inches.  The right was 19 inches.

## 2021-03-20 NOTE — ED Notes (Signed)
See triage note  Presents with swelling to right lower leg  States he was treated for infection to same leg about 1-2 months ago  Lower leg remains swollen with some redness

## 2021-10-18 NOTE — Progress Notes (Signed)
10/19/2021 10:26 AM   [name] 1965-12-30 [id number]  Referring provider: [facility] [address],  [place] [address]  Chief Complaint  Patient presents with   Hypogonadism    Urological history: 1. ED -contributing factors of age, diabetes, BPH, HTN and testosterone deficiency -SHIM 21  2. BPH with LU TS -PSA pending -cysto 2020 - mild BPH -I PSS 9/2  3. Testosterone deficiency -contributing factors of age, diabetes and obesity -testosterone level pending  4. Intermediate risk hematuria -non-smoker -work up in 2020 NED -no reports of gross heme -UA 3-10 RBC's    HPI: William Jimenez is a 55 y.o. male who presents today for testosterone deficiency and ED.  He complaints at this time with frequency and dysuria.  These symptoms have been occurring for several years.    Patient denies any modifying or aggravating factors.  Patient denies any gross hematuria or suprapubic/flank pain.  Patient denies any fevers, chills, nausea or vomiting.     IPSS     Row Name 10/19/21 1000         International Prostate Symptom Score   How often have you had the sensation of not emptying your bladder? Less than 1 in 5     How often have you had to urinate less than every two hours? More than half the time     How often have you found you stopped and started again several times when you urinated? Not at All     How often have you found it difficult to postpone urination? Not at All     How often have you had a weak urinary stream? Not at All     How often have you had to strain to start urination? Not at All     How many times did you typically get up at night to urinate? 4 Times     Total IPSS Score 9       Quality of Life due to urinary symptoms   If you were to spend the rest of your life with your urinary condition just the way it is now how would you feel about that? Mostly Satisfied              Score:  1-7 Mild 8-19  Moderate 20-35 Severe    Patient is not having spontaneous erections.     He denies any pain or curvature with erections.   No response to PDE5i's.    SHIM     Row Name 10/19/21 1046         SHIM: Over the last 6 months:   How do you rate your confidence that you could get and keep an erection? Very Low     When you had erections with sexual stimulation, how often were your erections hard enough for penetration (entering your partner)? Almost Always or Always     During sexual intercourse, how often were you able to maintain your erection after you had penetrated (entered) your partner? Almost Always or Always     During sexual intercourse, how difficult was it to maintain your erection to completion of intercourse? Not Difficult     When you attempted sexual intercourse, how often was it satisfactory for you? Almost Always or Always       SHIM Total Score   SHIM 21              Score: 1-7 Severe ED 8-11 Moderate ED 12-16  Mild-Moderate ED 17-21 Mild ED 22-25 No ED     PMH: Past Medical History:  Diagnosis Date   Hypertension     Surgical History: No past surgical history on file.  Home Medications:  Allergies as of 10/19/2021   No Known Allergies      Medication List        Accurate as of October 19, 2021 11:59 PM. If you have any questions, ask your nurse or doctor.          STOP taking these medications    AMBULATORY NON FORMULARY MEDICATION Stopped by: William Schnabel, PA-C   metFORMIN 500 MG tablet Commonly known as: GLUCOPHAGE Stopped by: William Bunn, PA-C   sildenafil 20 MG tablet Commonly known as: REVATIO Stopped by: William Bourque, PA-C   tadalafil 5 MG tablet Commonly known as: CIALIS Stopped by: William Cowboy, PA-C       TAKE these medications    alprostadil 1000 MCG pellet Commonly known as: MUSE 1 each (1,000 mcg total) by Transurethral route as needed for erectile dysfunction. use no more than 3 times per  week Started by: William Mohrmann, PA-C   amLODipine 10 MG tablet Commonly known as: NORVASC Take 10 mg by mouth daily.   amlodipine-atorvastatin 2.5-20 MG tablet Commonly known as: CADUET Take 1 tablet by mouth daily.   clomiPHENE 50 MG tablet Commonly known as: CLOMID Take 1/2 tablet daily What changed:  how much to take how to take this when to take this additional instructions Changed by: William Pense, PA-C   furosemide 20 MG tablet Commonly known as: Lasix Take 1 tablet (20 mg total) by mouth daily.   lisinopril-hydrochlorothiazide 20-25 MG tablet Commonly known as: ZESTORETIC Take 1 tablet by mouth daily. for high blood pressure   meloxicam 15 MG tablet Commonly known as: MOBIC TAKE 1 TABLET BY MOUTH ONCE DAILY FOR PAIN   NONFORMULARY OR COMPOUNDED ITEM Trimix (30/1/10)-(Pap/Phent/PGE)  Test Dose  52ml vial   Qty #3 Refills 0  [facility] [phone] Fax [phone] Started by: William Cowboy, PA-C   pantoprazole 20 MG tablet Commonly known as: Protonix Take 1 tablet (20 mg total) by mouth daily.        Allergies: No Known Allergies  Family History: Family History  Problem Relation Age of Onset   Prostate cancer Neg Hx    Bladder Cancer Neg Hx    Kidney cancer Neg Hx     Social History:  reports that he has never smoked. He uses smokeless tobacco. He reports that he does not drink alcohol and does not use drugs.  ROS: Pertinent ROS in HPI  Physical Exam: BP (!) 148/81   Pulse 66   Ht 5\' 10"  (1.778 m)   Wt (!) 305 lb (138.3 kg)   BMI 43.76 kg/m   Constitutional:  Well nourished. Alert and oriented, No acute distress. HEENT: [place] AT, mask in  place.  Trachea midline Cardiovascular: No clubbing, cyanosis, or edema. Respiratory: Normal respiratory effort, no increased work of breathing. GU: No CVA tenderness.  No bladder fullness or masses.  Patient with circumcised phallus.  Urethral meatus is patent.  No penile discharge.  No penile lesions or rashes. Scrotum without lesions, cysts, rashes and/or edema.  Testicles are located scrotally bilaterally. No masses are appreciated in the testicles. Left and right epididymis are normal. Rectal: Patient with  normal sphincter tone. Anus and perineum without scarring or rashes. No rectal masses are appreciated. Prostate is approximately 45 grams, no nodules  are appreciated. Seminal vesicles could not be palpated Neurologic: Grossly intact, no focal deficits, moving all 4 extremities. Psychiatric: Normal mood and affect.  Laboratory Data: Lab Results  Component Value Date   WBC 6.2 03/20/2021   HGB 13.8 03/20/2021   HCT 40.9 03/20/2021   MCV 79.3 (L) 03/20/2021   PLT 345 03/20/2021    Lab Results  Component Value Date   CREATININE 1.08 03/20/2021   Lab Results  Component Value Date   AST 26 12/27/2020   Lab Results  Component Value Date   ALT 31 12/27/2020    Urinalysis Component     Latest Ref Rng & Units 10/19/2021  Specific Gravity, UA     1.005 - 1.030 1.015  pH, UA     5.0 - 7.5 7.5  Color, UA     Yellow Yellow  Appearance Ur     Clear Hazy (A)  Leukocytes,UA     Negative Negative  Protein,UA     Negative/Trace Negative  Glucose, UA     Negative Negative  Ketones, UA     Negative Negative  RBC, UA     Negative Trace (A)  Bilirubin, UA     Negative Negative  Urobilinogen, Ur     0.2 - 1.0 mg/dL 0.2  Nitrite, UA     Negative Negative  Microscopic Examination      See below:   Component     Latest Ref Rng & Units 10/19/2021  WBC, UA     0 - 5 /hpf 0-5  RBC     0 - 2 /hpf 3-10 (A)  Epithelial Cells (non renal)     0 - 10 /hpf None seen  Casts     None seen /lpf Present (A)  Cast Type     N/A Granular casts (A)  Crystals     N/A Present (A)  Crystal Type     N/A Amorphous Sediment  Mucus, UA     Not Estab. Present (A)  Bacteria, UA     None seen/Few None seen  I have reviewed the labs.   Pertinent  Imaging: N/A  Assessment & Plan:    1. Testosterone deficiency -testosterone pending -restart Clomid 50 mg, 1/2 tablet daily  2. BPH with LUTS -PSA pending -DRE benign -UA micro heme -symptoms - frequency and dysuria -continue conservative management, avoiding bladder irritants and timed voiding's   3. ED -Failed PDE 5 inhibitors -Discussed Muse and he will carry a prescription to his pharmacy to see if his insurance covers the medication -Discussed Tri-Mix injections and he would like to schedule a titration appointment -Discussed penile prosthesis and he would like a referral to Dr. Gloriann Loan in [place] for further consideration  4. Microscopic hematuria - I explained to the patient that there are a number of causes that can be associated with blood in the urine, such as stones,  BPH, UTI's, damage to the urinary tract and/or cancer. -at this time, they are in a high risk stratification for hematuria per AUA guidelines due to their persistent micro heme-recommended studies for high risk are CT urogram - I explained to the patient that a contrast material will be injected into a vein and that in rare instances, an allergic reaction can result and may even life threatening (1:100,000)  The patient denies any allergies to contrast, iodine and/or seafood and is not taking metformin. - The patient had the opportunity to ask questions which were answered. Based upon this discussion, the patient  is willing to proceed. Therefore, I've ordered: a CT Urogram - The patient will return following all of the above for discussion of the results.  - UA - Urine culture     Return for Patient to call to schedule titration appointment.  These notes generated with voice recognition software. I apologize for typographical errors.  Zara Council, PA-C  [facility] [address]  [place], [place] [address] [phone]

## 2021-10-19 ENCOUNTER — Other Ambulatory Visit: Payer: Self-pay

## 2021-10-19 ENCOUNTER — Ambulatory Visit (INDEPENDENT_AMBULATORY_CARE_PROVIDER_SITE_OTHER): Payer: PRIVATE HEALTH INSURANCE | Admitting: Urology

## 2021-10-19 ENCOUNTER — Encounter: Payer: Self-pay | Admitting: Urology

## 2021-10-19 VITALS — BP 148/81 | HR 66 | Ht 70.0 in | Wt 305.0 lb

## 2021-10-19 DIAGNOSIS — R3129 Other microscopic hematuria: Secondary | ICD-10-CM | POA: Diagnosis not present

## 2021-10-19 DIAGNOSIS — R319 Hematuria, unspecified: Secondary | ICD-10-CM

## 2021-10-19 DIAGNOSIS — E349 Endocrine disorder, unspecified: Secondary | ICD-10-CM

## 2021-10-19 DIAGNOSIS — N529 Male erectile dysfunction, unspecified: Secondary | ICD-10-CM | POA: Diagnosis not present

## 2021-10-19 DIAGNOSIS — N401 Enlarged prostate with lower urinary tract symptoms: Secondary | ICD-10-CM

## 2021-10-19 DIAGNOSIS — N138 Other obstructive and reflux uropathy: Secondary | ICD-10-CM

## 2021-10-19 LAB — URINALYSIS, COMPLETE
Bilirubin, UA: NEGATIVE
Glucose, UA: NEGATIVE
Ketones, UA: NEGATIVE
Leukocytes,UA: NEGATIVE
Nitrite, UA: NEGATIVE
Protein,UA: NEGATIVE
Specific Gravity, UA: 1.015 (ref 1.005–1.030)
Urobilinogen, Ur: 0.2 mg/dL (ref 0.2–1.0)
pH, UA: 7.5 (ref 5.0–7.5)

## 2021-10-19 LAB — MICROSCOPIC EXAMINATION
Bacteria, UA: NONE SEEN
Epithelial Cells (non renal): NONE SEEN /hpf (ref 0–10)

## 2021-10-19 MED ORDER — NONFORMULARY OR COMPOUNDED ITEM: 0 refills | Status: DC

## 2021-10-19 MED ORDER — CLOMIPHENE CITRATE 50 MG PO TABS: ORAL_TABLET | ORAL | 3 refills | Status: DC

## 2021-10-19 MED ORDER — ALPROSTADIL (VASODILATOR) 1000 MCG UR PLLT: 1000.0000 ug | PELLET | URETHRAL | 12 refills | Status: DC | PRN

## 2021-10-19 NOTE — Patient Instructions (Signed)
Trimix Injection Training  The provider will prescribe the Trimix medication to [facility].  You will need to pick up the medication at:   [address]  [place], [place] [address] [phone]  Once you have the medication in hand you will need to call our office to schedule a morning ONLY appointment to have injection teaching. The medication will induce an erection. The medication is estimated at $80.00. This medication is time sensitive as it expires quickly.   You will need to have a normal blood pressure in order to be injected. Your blood pressure must be under 140/90. We will check your blood pressure multiple times to ensure the reading is correct, if you BP continues to be elevated we will need to reschedule your appointment.  

## 2021-10-20 LAB — TESTOSTERONE: Testosterone: 352 ng/dL (ref 264–916)

## 2021-10-20 LAB — PSA: Prostate Specific Ag, Serum: 1.1 ng/mL (ref 0.0–4.0)

## 2021-10-22 LAB — CULTURE, URINE COMPREHENSIVE

## 2021-10-23 NOTE — Progress Notes (Signed)
   10/24/2021 9:20 AM  William Jimenez 1966-01-14 [id number]   Referring provider: [facility], [facility] [address] [place],  [place] [address]   Chief Complaint  Patient presents with   Erectile Dysfunction   Urological history: 1. ED -contributing factors of age, diabetes, BPH, HTN and testosterone deficiency -SHIM 21   2. BPH with LU TS -PSA pending -cysto 2020 - mild BPH -I PSS 9/2   3. Testosterone deficiency -contributing factors of age, diabetes and obesity -testosterone level pending   4. Intermediate risk hematuria -non-smoker -work up in 2020 NED -no reports of gross heme -UA 3-10 RBC's     HPI: CHRISTON Jimenez is a 55 y.o. male who presents today for ICI titration.   Physical Exam: BP (!) 175/103   Pulse 83   Ht 5\' 10"  (1.778 m)   Wt (!) 305 lb (138.3 kg)   BMI 43.76 kg/m   Constitutional:  Well nourished. Alert and oriented, No acute distress. Psychiatric: Normal mood and affect.      Assessment & Plan:    1. ED -patient could not proceed with ICI secondary to uncontrolled HTN -he will reschedule and take his BP meds prior to his appointment    [facility] [address] [place], [place] [phone]

## 2021-10-24 ENCOUNTER — Other Ambulatory Visit: Payer: Self-pay

## 2021-10-24 ENCOUNTER — Ambulatory Visit (INDEPENDENT_AMBULATORY_CARE_PROVIDER_SITE_OTHER): Payer: PRIVATE HEALTH INSURANCE | Admitting: Urology

## 2021-10-24 VITALS — BP 175/103 | HR 83 | Ht 70.0 in | Wt 305.0 lb

## 2021-10-24 DIAGNOSIS — N529 Male erectile dysfunction, unspecified: Secondary | ICD-10-CM | POA: Diagnosis not present

## 2021-11-01 ENCOUNTER — Encounter: Payer: Self-pay | Admitting: Urology

## 2021-11-01 ENCOUNTER — Ambulatory Visit (INDEPENDENT_AMBULATORY_CARE_PROVIDER_SITE_OTHER): Payer: PRIVATE HEALTH INSURANCE | Admitting: Urology

## 2021-11-01 ENCOUNTER — Other Ambulatory Visit: Payer: Self-pay

## 2021-11-01 VITALS — BP 145/99 | HR 86 | Ht 71.0 in | Wt 290.0 lb

## 2021-11-01 DIAGNOSIS — N529 Male erectile dysfunction, unspecified: Secondary | ICD-10-CM

## 2021-11-01 NOTE — Progress Notes (Signed)
   11/01/2021 8:39 AM  William Jimenez 06/13/1966 [id number]   Referring provider: Center, [facility] [address] [place],  [place] [address]   Chief Complaint  Patient presents with   Erectile Dysfunction   Urological history: 1. ED -contributing factors of age, diabetes, BPH, HTN and testosterone deficiency -SHIM 21   2. BPH with LU TS -PSA pending -cysto 2020 - mild BPH -I PSS 9/2   3. Testosterone deficiency -contributing factors of age, diabetes and obesity -testosterone level pending   4. Intermediate risk hematuria -non-smoker -work up in 2020 NED -no reports of gross heme -UA 3-10 RBC's     HPI: William Jimenez is a 55 y.o. male who presents today for ICI titration.   Physical Exam: BP (!) 145/99   Pulse 86   Ht 5\' 11"  (1.803 m)   Wt 290 lb (131.5 kg)   BMI 40.45 kg/m   Constitutional:  Well nourished. Alert and oriented, No acute distress. HEENT:  AT, mask in place.  Trachea midline Cardiovascular: No clubbing, cyanosis, or edema. Respiratory: Normal respiratory effort, no increased work of breathing. GU: No CVA tenderness.  No bladder fullness or masses.  Patient with circumcised phallus.  Urethral meatus is patent.  No penile discharge. No penile lesions or rashes.  Neurologic: Grossly intact, no focal deficits, moving all 4 extremities. Psychiatric: Normal mood and affect.      Procedure  Patient's left corpus cavernosum is identified.  An area near the base of the penis is cleansed with rubbing alcohol.  Careful to avoid the dorsal vein, 2 mcg of Trimix (papaverine 30 mg, phentolamine 1 mg and prostaglandin E1 10 mcg, Lot # [id number]  exp # is injected at a 90 degree angle into the left corpus cavernosum near the base of the penis.  Patient experienced a penile fullness in 15 minutes.    Patient's right corpus cavernosum is identified.  An area near the base of the penis is cleansed with rubbing alcohol.  Careful to avoid  the dorsal vein, 2 mcg of Trimix (papaverine 30 mg, phentolamine 1 mg and prostaglandin E1 10 mcg, Lot # [id number]  exp # [id number]$ is injected at a 90 degree angle into the right corpus cavernosum near the base of the penis.  Patient experienced a firmness in 15 minutes.    Patient's left corpus cavernosum is identified.  An area near the base of the penis is cleansed with rubbing alcohol.  Careful to avoid the dorsal vein, 2 mcg of Trimix (papaverine 30 mg, phentolamine 1 mg and prostaglandin E1 10 mcg, Lot # [id number]  exp # [id number] is injected at a 90 degree angle into the left corpus cavernosum near the base of the penis.  Patient experienced a semi-firm erection in 15 minutes.     Assessment & Plan:    1. ED -patient will return for another ICI titration -at that appointment, we will start with 4 mcg -Advised patient of the condition of priapism, painful erection lasting for more than four hours, and to contact the office immediately or seek treatment in the ED   [id number]$  [facility] [address] [place], Hulan Fray [facility] [phone]  I spent 15 minutes on the day of the encounter to include pre-visit record review, face-to-face time with the patient, and post-visit ordering of tests.

## 2021-11-14 NOTE — Progress Notes (Deleted)
   11/14/2021 11:27 AM  [name] Jun 05, 1966 [id number]   Referring provider: Center, [facility] [address] [place],  [place] [address]   No chief complaint on file.  Urological history: 1. ED -contributing factors of age, diabetes, BPH, HTN and testosterone deficiency -SHIM 21   2. BPH with LU TS -PSA pending -cysto 2020 - mild BPH -I PSS 9/2   3. Testosterone deficiency -contributing factors of age, diabetes and obesity -testosterone level pending   4. Intermediate risk hematuria -non-smoker -work up in 2020 NED -no reports of gross heme -UA 3-10 RBC's     HPI: FADIL MACMASTER is a 55 y.o. male who presents today for ICI titration.   Physical Exam:  There were no vitals taken for this visit.  Constitutional:  Well nourished. Alert and oriented, No acute distress. HEENT: [place] AT, moist mucus membranes.  Trachea midline Cardiovascular: No clubbing, cyanosis, or edema. Respiratory: Normal respiratory effort, no increased work of breathing. GU: No CVA tenderness.  No bladder fullness or masses.  Patient with circumcised phallus.  Urethral meatus is patent.  No penile discharge. No penile lesions or rashes. . Neurologic: Grossly intact, no focal deficits, moving all 4 extremities. Psychiatric: Normal mood and affect.   Procedure  Patient's left corpus cavernosum is identified.  An area near the base of the penis is cleansed with rubbing alcohol.  Careful to avoid the dorsal vein, 2 mcg of Trimix (papaverine 30 mg, phentolamine 1 mg and prostaglandin E1 10 mcg, Lot # [id number]  exp # [id number]$ is injected at a 90 degree angle into the left corpus cavernosum near the base of the penis.  Patient experienced a penile fullness in 15 minutes.    Patient's right corpus cavernosum is identified.  An area near the base of the penis is cleansed with rubbing alcohol.  Careful to avoid the dorsal vein, 2 mcg of Trimix (papaverine 30 mg, phentolamine 1 mg and  prostaglandin E1 10 mcg, Lot # [id number]  exp # [id number] is injected at a 90 degree angle into the right corpus cavernosum near the base of the penis.  Patient experienced a firmness in 15 minutes.    Patient's left corpus cavernosum is identified.  An area near the base of the penis is cleansed with rubbing alcohol.  Careful to avoid the dorsal vein, 2 mcg of Trimix (papaverine 30 mg, phentolamine 1 mg and prostaglandin E1 10 mcg, Lot # [id number]  exp # [id number]$ is injected at a 90 degree angle into the left corpus cavernosum near the base of the penis.  Patient experienced a semi-firm erection in 15 minutes.     Assessment & Plan:    1. ED -patient will return for another ICI titration -at that appointment, we will start with 4 mcg -Advised patient of the condition of priapism, painful erection lasting for more than four hours, and to contact the office immediately or seek treatment in the ED   [id number]  [facility] [address] [place], [facility] [address] [phone]  I spent 15 minutes on the day of the encounter to include pre-visit record review, face-to-face time with the patient, and post-visit ordering of tests.

## 2021-11-15 ENCOUNTER — Ambulatory Visit: Payer: No Typology Code available for payment source | Admitting: Urology

## 2021-11-15 DIAGNOSIS — N529 Male erectile dysfunction, unspecified: Secondary | ICD-10-CM

## 2021-11-21 ENCOUNTER — Other Ambulatory Visit: Payer: Self-pay

## 2021-11-21 ENCOUNTER — Ambulatory Visit
Admission: RE | Admit: 2021-11-21 | Discharge: 2021-11-21 | Disposition: A | Discharge status: Home / Self Care | Payer: No Typology Code available for payment source | Source: Ambulatory Visit | Attending: Urology | Admitting: Urology

## 2021-11-21 DIAGNOSIS — R3129 Other microscopic hematuria: Secondary | ICD-10-CM | POA: Diagnosis not present

## 2021-11-21 LAB — POCT I-STAT CREATININE: Creatinine, Ser: 1.3 mg/dL — ABNORMAL HIGH (ref 0.61–1.24)

## 2021-11-21 MED ORDER — IOHEXOL 350 MG/ML SOLN
100.0000 mL | Freq: Once | INTRAVENOUS | Status: AC | PRN
  Administered 2021-11-21: 100 mL via INTRAVENOUS

## 2021-11-22 ENCOUNTER — Telehealth: Payer: Self-pay | Admitting: Urology

## 2021-11-22 NOTE — Telephone Encounter (Signed)
Please let Mr. William Jimenez know that his CT scan did not show any reason for the microscopic hematuria, so we need to repeat the cystoscopy as he is at high risk for bladder cancer secondary to his smoking.  He can be scheduled with any physician as he had seen Dr. Mena Goes in the past.

## 2021-11-23 NOTE — Telephone Encounter (Signed)
Spoke with patient and advised results, cysto scheduled

## 2021-11-30 ENCOUNTER — Other Ambulatory Visit: Payer: Self-pay

## 2021-11-30 ENCOUNTER — Ambulatory Visit (INDEPENDENT_AMBULATORY_CARE_PROVIDER_SITE_OTHER): Payer: PRIVATE HEALTH INSURANCE | Admitting: Urology

## 2021-11-30 ENCOUNTER — Encounter: Payer: Self-pay | Admitting: Urology

## 2021-11-30 VITALS — BP 156/98 | HR 101 | Ht 71.0 in | Wt 295.0 lb

## 2021-11-30 DIAGNOSIS — R3129 Other microscopic hematuria: Secondary | ICD-10-CM | POA: Diagnosis not present

## 2021-11-30 LAB — URINALYSIS, COMPLETE
Bilirubin, UA: NEGATIVE
Glucose, UA: NEGATIVE
Leukocytes,UA: NEGATIVE
Nitrite, UA: NEGATIVE
Specific Gravity, UA: 1.02 (ref 1.005–1.030)
Urobilinogen, Ur: 0.2 mg/dL (ref 0.2–1.0)
pH, UA: 6 (ref 5.0–7.5)

## 2021-11-30 LAB — MICROSCOPIC EXAMINATION: Bacteria, UA: NONE SEEN

## 2021-11-30 NOTE — Progress Notes (Signed)
° °  11/30/21  CC:  Chief Complaint  Patient presents with   Cysto    HPI: 55 y.o. male with microhematuria.  Refer to [facility]'s note 11/01/2021.  CT urogram 11/21/2021 showed no upper tract abnormalities  Blood pressure (!) 156/98, pulse (!) 101, height 5\' 11"  (1.803 m), weight 295 lb (133.8 kg). NED. A&Ox3.   No respiratory distress   Abd soft, NT, ND Normal phallus with bilateral descended testicles  Cystoscopy Procedure Note  Patient identification was confirmed, informed consent was obtained, and patient was prepped using Betadine solution.  Lidocaine jelly was administered per urethral meatus.     Pre-Procedure: - Inspection reveals a normal caliber urethral meatus.  Procedure: The flexible cystoscope was introduced without difficulty - No urethral strictures/lesions are present. -  Mild lateral lobe enlargement with hypervascularity  prostate  - Normal bladder neck - Bilateral ureteral orifices identified - Bladder mucosa  reveals no ulcers, tumors, or lesions - No bladder stones - No trabeculation  Retroflexion shows no intravesical median lobe or lesions   Post-Procedure: - Patient tolerated the procedure well  Assessment/ Plan: Inflammatory changes prostate; normal bladder mucosa No upper tract abnormalities on CT urogram Follow-up [place] 6 months with repeat UA    , MD

## 2021-12-02 ENCOUNTER — Encounter: Payer: Self-pay | Admitting: Urology

## 2022-04-07 ENCOUNTER — Encounter: Payer: Self-pay | Admitting: Emergency Medicine

## 2022-04-07 ENCOUNTER — Other Ambulatory Visit: Payer: Self-pay

## 2022-04-07 ENCOUNTER — Emergency Department: Payer: PRIVATE HEALTH INSURANCE

## 2022-04-07 ENCOUNTER — Observation Stay
Admission: EM | Admit: 2022-04-07 | Discharge: 2022-04-08 | Disposition: A | Discharge status: Home / Self Care | Payer: PRIVATE HEALTH INSURANCE | Attending: Internal Medicine | Admitting: Internal Medicine

## 2022-04-07 DIAGNOSIS — I161 Hypertensive emergency: Principal | ICD-10-CM | POA: Diagnosis present

## 2022-04-07 DIAGNOSIS — E785 Hyperlipidemia, unspecified: Secondary | ICD-10-CM

## 2022-04-07 DIAGNOSIS — R778 Other specified abnormalities of plasma proteins: Secondary | ICD-10-CM | POA: Diagnosis not present

## 2022-04-07 DIAGNOSIS — R079 Chest pain, unspecified: Secondary | ICD-10-CM | POA: Diagnosis not present

## 2022-04-07 DIAGNOSIS — R059 Cough, unspecified: Secondary | ICD-10-CM

## 2022-04-07 DIAGNOSIS — E876 Hypokalemia: Secondary | ICD-10-CM

## 2022-04-07 DIAGNOSIS — E119 Type 2 diabetes mellitus without complications: Secondary | ICD-10-CM | POA: Insufficient documentation

## 2022-04-07 DIAGNOSIS — E1169 Type 2 diabetes mellitus with other specified complication: Secondary | ICD-10-CM

## 2022-04-07 DIAGNOSIS — Z20822 Contact with and (suspected) exposure to covid-19: Secondary | ICD-10-CM | POA: Insufficient documentation

## 2022-04-07 DIAGNOSIS — G4733 Obstructive sleep apnea (adult) (pediatric): Secondary | ICD-10-CM | POA: Diagnosis present

## 2022-04-07 DIAGNOSIS — Z79899 Other long term (current) drug therapy: Secondary | ICD-10-CM | POA: Insufficient documentation

## 2022-04-07 DIAGNOSIS — J209 Acute bronchitis, unspecified: Secondary | ICD-10-CM | POA: Insufficient documentation

## 2022-04-07 DIAGNOSIS — I160 Hypertensive urgency: Secondary | ICD-10-CM | POA: Insufficient documentation

## 2022-04-07 DIAGNOSIS — I10 Essential (primary) hypertension: Secondary | ICD-10-CM

## 2022-04-07 HISTORY — DX: Hyperlipidemia, unspecified: E78.5

## 2022-04-07 HISTORY — DX: Hypertensive emergency: I16.1

## 2022-04-07 LAB — RESP PANEL BY RT-PCR (FLU A&B, COVID) ARPGX2
Influenza A by PCR: NEGATIVE
Influenza B by PCR: NEGATIVE
SARS Coronavirus 2 by RT PCR: NEGATIVE

## 2022-04-07 LAB — MAGNESIUM: Magnesium: 2 mg/dL (ref 1.7–2.4)

## 2022-04-07 LAB — BASIC METABOLIC PANEL
Anion gap: 6 (ref 5–15)
BUN: 15 mg/dL (ref 6–20)
CO2: 30 mmol/L (ref 22–32)
Calcium: 8.6 mg/dL — ABNORMAL LOW (ref 8.9–10.3)
Chloride: 100 mmol/L (ref 98–111)
Creatinine, Ser: 1.13 mg/dL (ref 0.61–1.24)
GFR, Estimated: >60 mL/min (ref >60)
Glucose, Bld: 134 mg/dL — ABNORMAL HIGH (ref 70–99)
Potassium: 3.4 mmol/L — ABNORMAL LOW (ref 3.5–5.1)
Sodium: 136 mmol/L (ref 135–145)

## 2022-04-07 LAB — CBC
HCT: 45.4 % (ref 39.0–52.0)
Hemoglobin: 14.5 g/dL (ref 13.0–17.0)
MCH: 26.4 pg (ref 26.0–34.0)
MCHC: 31.9 g/dL (ref 30.0–36.0)
MCV: 82.7 fL (ref 80.0–100.0)
Platelets: 293 10*3/uL (ref 150–400)
RBC: 5.49 MIL/uL (ref 4.22–5.81)
RDW: 14.3 % (ref 11.5–15.5)
WBC: 8.1 10*3/uL (ref 4.0–10.5)
nRBC: 0 % (ref 0.0–0.2)

## 2022-04-07 LAB — LIPID PANEL
Cholesterol: 212 mg/dL — ABNORMAL HIGH (ref 0–200)
HDL: 44 mg/dL (ref >40)
LDL Cholesterol: 157 mg/dL — ABNORMAL HIGH (ref 0–99)
Total CHOL/HDL Ratio: 4.8 RATIO
Triglycerides: 56 mg/dL (ref <150)
VLDL: 11 mg/dL (ref 0–40)

## 2022-04-07 LAB — URINE DRUG SCREEN, QUALITATIVE (ARMC ONLY)
Amphetamines, Ur Screen: NOT DETECTED
Barbiturates, Ur Screen: NOT DETECTED
Benzodiazepine, Ur Scrn: NOT DETECTED
Cannabinoid 50 Ng, Ur ~~LOC~~: NOT DETECTED
Cocaine Metabolite,Ur ~~LOC~~: NOT DETECTED
MDMA (Ecstasy)Ur Screen: NOT DETECTED
Methadone Scn, Ur: NOT DETECTED
Opiate, Ur Screen: NOT DETECTED
Phencyclidine (PCP) Ur S: NOT DETECTED
Tricyclic, Ur Screen: NOT DETECTED

## 2022-04-07 LAB — HEMOGLOBIN A1C
Hgb A1c MFr Bld: 6.7 % — ABNORMAL HIGH (ref 4.8–5.6)
Mean Plasma Glucose: 145.59 mg/dL

## 2022-04-07 LAB — PROTIME-INR
INR: 1.1 (ref 0.8–1.2)
Prothrombin Time: 13.8 seconds (ref 11.4–15.2)

## 2022-04-07 LAB — TROPONIN I (HIGH SENSITIVITY)
Troponin I (High Sensitivity): 57 ng/L — ABNORMAL HIGH (ref <18)
Troponin I (High Sensitivity): 58 ng/L — ABNORMAL HIGH (ref <18)
Troponin I (High Sensitivity): 59 ng/L — ABNORMAL HIGH (ref <18)
Troponin I (High Sensitivity): 49 ng/L — ABNORMAL HIGH (ref <18)
Troponin I (High Sensitivity): 42 ng/L — ABNORMAL HIGH (ref <18)

## 2022-04-07 LAB — PROCALCITONIN: Procalcitonin: <0.1 ng/mL

## 2022-04-07 LAB — D-DIMER, QUANTITATIVE: D-Dimer, Quant: 0.28 ug/mL-FEU (ref 0.00–0.50)

## 2022-04-07 LAB — APTT: aPTT: 31 seconds (ref 24–36)

## 2022-04-07 LAB — BRAIN NATRIURETIC PEPTIDE: B Natriuretic Peptide: 143.3 pg/mL — ABNORMAL HIGH (ref 0.0–100.0)

## 2022-04-07 MED ORDER — NITROGLYCERIN 0.4 MG SL SUBL: 0.4000 mg | SUBLINGUAL_TABLET | SUBLINGUAL | Status: DC | PRN

## 2022-04-07 MED ORDER — AMLODIPINE BESYLATE 5 MG PO TABS
10.0000 mg | ORAL_TABLET | Freq: Once | ORAL | Status: AC
  Administered 2022-04-07: 10 mg via ORAL
  Filled 2022-04-07 (×2): qty 2

## 2022-04-07 MED ORDER — LISINOPRIL 10 MG PO TABS
20.0000 mg | ORAL_TABLET | Freq: Once | ORAL | Status: AC
  Administered 2022-04-07: 20 mg via ORAL
  Filled 2022-04-07: qty 2

## 2022-04-07 MED ORDER — POTASSIUM CHLORIDE CRYS ER 20 MEQ PO TBCR: 20.0000 meq | EXTENDED_RELEASE_TABLET | Freq: Once | ORAL | Status: DC

## 2022-04-07 MED ORDER — POTASSIUM CHLORIDE CRYS ER 20 MEQ PO TBCR
40.0000 meq | EXTENDED_RELEASE_TABLET | Freq: Once | ORAL | Status: AC
Start: 2022-04-07 — End: 2022-04-07
  Administered 2022-04-07: 40 meq via ORAL
  Filled 2022-04-07: qty 2

## 2022-04-07 MED ORDER — HYDROCHLOROTHIAZIDE 25 MG PO TABS
25.0000 mg | ORAL_TABLET | Freq: Once | ORAL | Status: AC
  Administered 2022-04-07: 25 mg via ORAL
  Filled 2022-04-07: qty 1

## 2022-04-07 MED ORDER — ALBUTEROL SULFATE (2.5 MG/3ML) 0.083% IN NEBU: 2.5000 mg | INHALATION_SOLUTION | RESPIRATORY_TRACT | Status: DC | PRN

## 2022-04-07 MED ORDER — DM-GUAIFENESIN ER 30-600 MG PO TB12: 1.0000 | ORAL_TABLET | Freq: Two times a day (BID) | ORAL | Status: DC | PRN

## 2022-04-07 MED ORDER — ACETAMINOPHEN 325 MG PO TABS: 650.0000 mg | ORAL_TABLET | Freq: Four times a day (QID) | ORAL | Status: DC | PRN

## 2022-04-07 MED ORDER — AMLODIPINE BESYLATE 10 MG PO TABS
10.0000 mg | ORAL_TABLET | Freq: Every day | ORAL | Status: DC
  Administered 2022-04-08: 10 mg via ORAL
  Filled 2022-04-07: qty 1

## 2022-04-07 MED ORDER — ATORVASTATIN CALCIUM 20 MG PO TABS
20.0000 mg | ORAL_TABLET | Freq: Every day | ORAL | Status: DC
  Administered 2022-04-07 – 2022-04-08 (×2): 20 mg via ORAL
  Filled 2022-04-07 (×2): qty 1

## 2022-04-07 MED ORDER — HYDROCHLOROTHIAZIDE 25 MG PO TABS
25.0000 mg | ORAL_TABLET | Freq: Every day | ORAL | Status: DC
  Administered 2022-04-08: 25 mg via ORAL
  Filled 2022-04-07: qty 1

## 2022-04-07 MED ORDER — ASPIRIN EC 81 MG PO TBEC
81.0000 mg | DELAYED_RELEASE_TABLET | Freq: Every day | ORAL | Status: DC
  Administered 2022-04-08: 81 mg via ORAL
  Filled 2022-04-07: qty 1

## 2022-04-07 MED ORDER — ENOXAPARIN SODIUM 80 MG/0.8ML IJ SOSY
0.5000 mg/kg | PREFILLED_SYRINGE | INTRAMUSCULAR | Status: DC
  Filled 2022-04-07: qty 0.8
  Filled 2022-04-07: qty 0.68

## 2022-04-07 MED ORDER — MORPHINE SULFATE (PF) 2 MG/ML IV SOLN: 2.0000 mg | INTRAVENOUS | Status: DC | PRN

## 2022-04-07 MED ORDER — HYDRALAZINE HCL 20 MG/ML IJ SOLN: 5.0000 mg | INTRAMUSCULAR | Status: DC | PRN

## 2022-04-07 MED ORDER — ASPIRIN 81 MG PO CHEW
324.0000 mg | CHEWABLE_TABLET | Freq: Once | ORAL | Status: AC
  Administered 2022-04-07: 324 mg via ORAL
  Filled 2022-04-07: qty 4

## 2022-04-07 MED ORDER — HYDROCHLOROTHIAZIDE 25 MG PO TABS: 25.0000 mg | ORAL_TABLET | Freq: Every day | ORAL | Status: DC

## 2022-04-07 MED ORDER — ENOXAPARIN SODIUM 40 MG/0.4ML IJ SOSY: 40.0000 mg | PREFILLED_SYRINGE | INTRAMUSCULAR | Status: DC

## 2022-04-07 MED ORDER — LISINOPRIL 20 MG PO TABS
40.0000 mg | ORAL_TABLET | Freq: Every day | ORAL | Status: DC
  Administered 2022-04-08: 40 mg via ORAL
  Filled 2022-04-07: qty 2

## 2022-04-07 MED ORDER — ONDANSETRON HCL 4 MG/2ML IJ SOLN: 4.0000 mg | Freq: Three times a day (TID) | INTRAMUSCULAR | Status: DC | PRN

## 2022-04-07 MED ORDER — LISINOPRIL 10 MG PO TABS: 20.0000 mg | ORAL_TABLET | Freq: Every day | ORAL | Status: DC

## 2022-04-07 MED ORDER — HYDRALAZINE HCL 20 MG/ML IJ SOLN
10.0000 mg | INTRAMUSCULAR | Status: DC | PRN
  Administered 2022-04-08: 10 mg via INTRAVENOUS
  Filled 2022-04-07 (×2): qty 1

## 2022-04-07 NOTE — Assessment & Plan Note (Signed)
BMI=42.76   and BW=135.2 Kg ?-Diet and exercise.   ?-Encouraged to lose weight. ? ?

## 2022-04-07 NOTE — ED Notes (Signed)
Patient taken to radiology prior to bringing him to his room assignment ?

## 2022-04-07 NOTE — Assessment & Plan Note (Signed)
K 3.4 ?-Repleted potassium ?-Check  magnesium level ?

## 2022-04-07 NOTE — Assessment & Plan Note (Addendum)
Bp is 191/123 --> 187/135.  ?-will place on tele bed for obs ?-IV IV hydralazine as needed ?-Increase lisinopril dose from 20 to 40 mg daily ?-Amlodipine 10 mg daily ?-HCTZ 25 mg daily ? ?

## 2022-04-07 NOTE — Assessment & Plan Note (Signed)
-  pt wants to try to use CPAP  ?

## 2022-04-07 NOTE — H&P (Addendum)
?History and Physical  ? ? ?William Jimenez [id number] DOB: May 02, 1966 DOA: 04/07/2022 ? ?Referring MD/NP/PA:  ? ?PCP: Center, [facility]  ? ?Patient coming from:  The patient is coming from home.  At baseline, pt is independent for most of ADL.       ? ?Chief Complaint: chest pain ? ?HPI: William Jimenez is a 56 y.o. male with medical history significant of HTN, HLD, OSA, morbid obesity with BMI 42.76, who presents with chest pain. ? ?Patient states that he has intermittent chest pain for about 3 days, which is located in the left side of chest, pressure-like, sometimes pleuritic worse with deep breath, 3 out of 10 in severity initially, currently chest pain-free, nonradiating.  He states that he has OSA and has not started using CPAP yet. He states that he has shortness of breath at night sometimes.  He also has dry cough, no fever or chills.  No nausea, vomiting, diarrhea or abdominal pain.  No symptoms of UTI. ? ?Data Reviewed and ED Course: pt was found to have trop 57 -->58, procalcitonin <0.10, WBC 8.1, negative D-dimer 0.28, negative COVID PCR, GFR> 60, potassium 3.4, temperature normal, blood pressure 191/123, heart rate 92, 46, RR 20, oxygen saturation 96% on room air.  Chest x-ray showed possible acute bronchitis.  Patient initially placed on cardiac telemetry bed for observation. ? ?EKG: I have personally reviewed.  Sinus rhythm, QTc 489, LAE, PVC, anteroseptal infarction pattern, poor R wave progression ? ? ?Review of Systems:  ? ?General: no fevers, chills, no body weight gain, has fatigue ?HEENT: no blurry vision, hearing changes or sore throat ?Respiratory: has dyspnea, coughing, no wheezing ?CV: has chest pain, no palpitations ?GI: no nausea, vomiting, abdominal pain, diarrhea, constipation ?GU: no dysuria, burning on urination, increased urinary frequency, hematuria  ?Ext: no leg edema ?Neuro: no unilateral weakness, numbness, or tingling, no vision change or hearing loss ?Skin: no rash,  no skin tear. ?MSK: No muscle spasm, no deformity, no limitation of range of movement in spin ?Heme: No easy bruising.  ?Travel history: No recent long distant travel. ? ? ?Allergy: No Known Allergies ? ?Past Medical History:  ?Diagnosis Date  ? HLD (hyperlipidemia)   ? Hypertension   ? ? ?Past Surgical History:  ?Procedure Laterality Date  ? CYSTOSCOPY    ? ? ?Social History:  reports that he has never smoked. He uses smokeless tobacco. He reports that he does not drink alcohol and does not use drugs. ? ?Family History:  ?Family History  ?Problem Relation Age of Onset  ? Prostate cancer Neg Hx   ? Bladder Cancer Neg Hx   ? Kidney cancer Neg Hx   ?  ? ?Prior to Admission medications   ?Medication Sig Start Date End Date Taking? Authorizing Provider  ?alprostadil (MUSE) 1000 MCG pellet 1 each (1,000 mcg total) by Transurethral route as needed for erectile dysfunction. use no more than 3 times per week 10/19/21   Zara Council A, PA-C  ?amLODipine (NORVASC) 10 MG tablet Take 10 mg by mouth daily. 10/09/21   [provider]  ?amlodipine-atorvastatin (CADUET) 2.5-20 MG tablet Take 1 tablet by mouth daily.    [provider]  ?clomiPHENE (CLOMID) 50 MG tablet Take 1/2 tablet daily 10/19/21   Zara Council A, PA-C  ?furosemide (LASIX) 20 MG tablet Take 1 tablet (20 mg total) by mouth daily. 03/20/21 03/20/22  Sable Feil, PA-C  ?lisinopril-hydrochlorothiazide (ZESTORETIC) 20-25 MG tablet Take 1 tablet by mouth  daily. for high blood pressure 04/19/19   [provider]  ?meloxicam (MOBIC) 15 MG tablet TAKE 1 TABLET BY MOUTH ONCE DAILY FOR PAIN 03/12/19   [provider]  ?NONFORMULARY OR COMPOUNDED ITEM Trimix (30/1/10)-(Pap/Phent/PGE) ? ?Test Dose  35ml vial  ? ?Qty #3 Refills 0 ? ?[facility] ?[phone] ?Fax [phone] 10/19/21   Zara Council A, PA-C  ?pantoprazole (PROTONIX) 20 MG tablet Take 1 tablet (20 mg total) by mouth daily. 12/15/16 12/15/17  Merlyn Lot,  MD  ? ? ?Physical Exam: ?Vitals:  ? 04/07/22 0619 04/07/22 0645 04/07/22 0730 04/07/22 0800  ?BP:  (!) 191/123 (!) 187/134 (!) 187/135  ?Pulse:  (!) 46 88 85  ?Resp:  20 19 12   ?Temp:      ?TempSrc:      ?SpO2:  96% 92% 100%  ?Weight: 135.2 kg     ?Height: 5\' 10"  (1.778 m)     ? ?General: Not in acute distress ?HEENT: ?      Eyes: PERRL, EOMI, no scleral icterus. ?      ENT: No discharge from the ears and nose, no pharynx injection, no tonsillar enlargement.  ?      Neck: No JVD, no bruit, no mass felt. ?Heme: No neck lymph node enlargement. ?Cardiac: S1/S2, RRR, No murmurs, No gallops or rubs. ?Respiratory: No rales, wheezing, rhonchi or rubs. ?GI: Soft, nondistended, nontender, no rebound pain, no organomegaly, BS present. ?GU: No hematuria ?Ext: No pitting leg edema bilaterally. 1+DP/PT pulse bilaterally. ?Musculoskeletal: No joint deformities, No joint redness or warmth, no limitation of ROM in spin. ?Skin: No rashes.  ?Neuro: Alert, oriented X3, cranial nerves II-XII grossly intact, moves all extremities normally.  ?Psych: Patient is not psychotic, no suicidal or hemocidal ideation. ? ?Labs on Admission: I have personally reviewed following labs and imaging studies ? ?CBC: ?Recent Labs  ?Lab 04/07/22 ?0606  ?WBC 8.1  ?HGB 14.5  ?HCT 45.4  ?MCV 82.7  ?PLT 293  ? ?Basic Metabolic Panel: ?Recent Labs  ?Lab 04/07/22 ?0606 04/07/22 ?[id number]  ?NA 136  --   ?K 3.4*  --   ?CL 100  --   ?CO2 30  --   ?GLUCOSE 134*  --   ?BUN 15  --   ?CREATININE 1.13  --   ?CALCIUM 8.6*  --   ?MG  --  2.0  ? ?GFR: ?Estimated Creatinine Clearance: 104.7 mL/min (by C-G formula based on SCr of 1.13 mg/dL). ?Liver Function Tests: ?No results for input(s): AST, ALT, ALKPHOS, BILITOT, PROT, ALBUMIN in the last 168 hours. ?No results for input(s): LIPASE, AMYLASE in the last 168 hours. ?No results for input(s): AMMONIA in the last 168 hours. ?Coagulation Profile: ?No results for input(s): INR, PROTIME in the last 168 hours. ?Cardiac Enzymes: ?No  results for input(s): CKTOTAL, CKMB, CKMBINDEX, TROPONINI in the last 168 hours. ?BNP (last 3 results) ?No results for input(s): PROBNP in the last 8760 hours. ?HbA1C: ?No results for input(s): HGBA1C in the last 72 hours. ?CBG: ?No results for input(s): GLUCAP in the last 168 hours. ?Lipid Profile: ?Recent Labs  ?  04/07/22 ?0739  ?CHOL 212*  ?HDL 44  ?LDLCALC 157*  ?TRIG 56  ?CHOLHDL 4.8  ? ?Thyroid Function Tests: ?No results for input(s): TSH, T4TOTAL, FREET4, T3FREE, THYROIDAB in the last 72 hours. ?Anemia Panel: ?No results for input(s): VITAMINB12, FOLATE, FERRITIN, TIBC, IRON, RETICCTPCT in the last 72 hours. ?Urine analysis: ?   ?Component Value Date/Time  ? COLORURINE Yellow 07/03/2013 1011  ?  APPEARANCEUR Cloudy (A) 11/30/2021 1344  ? LABSPEC 1.026 07/03/2013 1011  ? PHURINE 5.0 07/03/2013 1011  ? GLUCOSEU Negative 11/30/2021 1344  ? GLUCOSEU Negative 07/03/2013 1011  ? HGBUR 2+ 07/03/2013 1011  ? BILIRUBINUR Negative 11/30/2021 1344  ? BILIRUBINUR Negative 07/03/2013 1011  ? KETONESUR Negative 07/03/2013 1011  ? PROTEINUR 1+ (A) 11/30/2021 1344  ? PROTEINUR Negative 07/03/2013 1011  ? NITRITE Negative 11/30/2021 1344  ? NITRITE Negative 07/03/2013 1011  ? LEUKOCYTESUR Negative 11/30/2021 1344  ? LEUKOCYTESUR Negative 07/03/2013 1011  ? ?Sepsis Labs: ?@LABRCNTIP (procalcitonin:4,lacticidven:4) ?) ?Recent Results (from the past 240 hour(s))  ?Resp Panel by RT-PCR (Flu A&B, Covid) Nasopharyngeal Swab     Status: None  ? Collection Time: 04/07/22  6:22 AM  ? Specimen: Nasopharyngeal Swab; Nasopharyngeal(NP) swabs in vial transport medium  ?Result Value Ref Range Status  ? SARS Coronavirus 2 by RT PCR NEGATIVE NEGATIVE Final  ?  Comment: (NOTE) ?SARS-CoV-2 target nucleic acids are NOT DETECTED. ? ?The SARS-CoV-2 RNA is generally detectable in upper respiratory ?specimens during the acute phase of infection. The lowest ?concentration of SARS-CoV-2 viral copies this assay can detect is ?138 copies/mL. A negative  result does not preclude SARS-Cov-2 ?infection and should not be used as the sole basis for treatment or ?other patient management decisions. A negative result may occur with  ?improper specimen coll

## 2022-04-07 NOTE — Discharge Instructions (Addendum)

## 2022-04-07 NOTE — Progress Notes (Signed)
PHARMACIST - PHYSICIAN COMMUNICATION ? ?CONCERNING:  Enoxaparin (Lovenox) for DVT Prophylaxis  ? ? ?RECOMMENDATION: ?Patient was prescribed enoxaprin 40mg  q24 hours for VTE prophylaxis.  ? Danley Danker Weights  ? 04/07/22 [id number]  ?Weight: 135.2 kg (298 lb)  ? ? ?Body mass index is 42.76 kg/m?. ? ?Estimated Creatinine Clearance: 104.7 mL/min (by C-G formula based on SCr of 1.13 mg/dL). ? ? ?Based on [place] patient is candidate for enoxaparin 0.5mg /kg TBW SQ every 24 hours based on BMI being >30. ? ? ?DESCRIPTION: ?Pharmacy has adjusted enoxaparin dose per [facility] policy. ? ?Patient is now receiving enoxaparin 0.5 mg/kg every 24 hours  ? ? ?Dallie Piles, PharmD ?Clinical Pharmacist  ?04/07/2022 ?7:29 AM ? ?

## 2022-04-07 NOTE — ED Triage Notes (Signed)
Pt to ED via POV with c/o SOB and L sided CP. Pt states CP worse with deep breath. Pt states hx of sleep apnea and notices SHOB when he wakes up. Pt with mild dyspnea with exertion.  ? ?Pt states pain in his chest is a discomfort in nature, feels it/feels like it is worse with inspiration. Pt able to speak in full and complete sentences on arrival. Pt placed in wheelchair on arrival.  ?

## 2022-04-07 NOTE — Assessment & Plan Note (Signed)
lipitor

## 2022-04-07 NOTE — Assessment & Plan Note (Signed)
Chest x-ray showed possible acute bronchitis.  Patient does not have fever or leukocytosis.  Procalcitonin <0.10.  Negative COVID PCR.  Possibly due to upper respiratory viral infection ?-As needed albuterol and Mucinex ?

## 2022-04-07 NOTE — ED Provider Notes (Addendum)
[facility] Provider Note    Event Date/Time   First MD Initiated Contact with Patient 04/07/22 [phone]     (approximate)   History   Chest Pain and Shortness of Breath   HPI  William Jimenez is a 56 y.o. male with history of obesity, hypertension, hyperlipidemia, sleep apnea who presents to the emergency department with several days of left-sided chest pressure, shortness of breath worse with exertion, lying flat.  No chest pressure currently.  No nausea, vomiting, diaphoresis, dizziness.  States he has had a productive cough and feels his chest "rattles" when he has forced expiration.  Also has some pain with inspiration.  No history of CAD, PE or DVT, lower extremity swelling or pain.  No history of previous stress test or cardiac catheterization.  States his father had a history of a heart murmur but no family history of premature CAD.   History provided by patient.    Past Medical History:  Diagnosis Date   Hypertension     History reviewed. No pertinent surgical history.  MEDICATIONS:  Prior to Admission medications   Medication Sig Start Date End Date Taking? Authorizing Provider  alprostadil (MUSE) 1000 MCG pellet 1 each (1,000 mcg total) by Transurethral route as needed for erectile dysfunction. use no more than 3 times per week 10/19/21   Michiel Cowboy A, PA-C  amLODipine (NORVASC) 10 MG tablet Take 10 mg by mouth daily. 10/09/21   [provider]  amlodipine-atorvastatin (CADUET) 2.5-20 MG tablet Take 1 tablet by mouth daily.    [provider]  clomiPHENE (CLOMID) 50 MG tablet Take 1/2 tablet daily 10/19/21   Michiel Cowboy A, PA-C  furosemide (LASIX) 20 MG tablet Take 1 tablet (20 mg total) by mouth daily. 03/20/21 03/20/22  Joni Reining, PA-C  lisinopril-hydrochlorothiazide (ZESTORETIC) 20-25 MG tablet Take 1 tablet by mouth daily. for high blood pressure 04/19/19   [provider]  meloxicam (MOBIC) 15 MG tablet TAKE  1 TABLET BY MOUTH ONCE DAILY FOR PAIN 03/12/19   [provider]  NONFORMULARY OR COMPOUNDED ITEM Trimix (30/1/10)-(Pap/Phent/PGE)  Test Dose  1ml vial   Qty #3 Refills 0  [facility] [phone] Fax [phone] 10/19/21   Michiel Cowboy A, PA-C  pantoprazole (PROTONIX) 20 MG tablet Take 1 tablet (20 mg total) by mouth daily. 12/15/16 12/15/17  Willy Eddy, MD    Physical Exam   Triage Vital Signs: ED Triage Vitals  Enc Vitals Group     BP 04/07/22 0555 (!) 189/133     Pulse Rate 04/07/22 0555 92     Resp 04/07/22 0555 20     Temp 04/07/22 0555 98.3 F (36.8 C)     Temp Source 04/07/22 0555 Oral     SpO2 04/07/22 0555 94 %     Weight 04/07/22 0619 298 lb (135.2 kg)     Height 04/07/22 0619 5\' 10"  (1.778 m)     Head Circumference --      Peak Flow --      Pain Score 04/07/22 0549 4     Pain Loc --      Pain Edu? --      Excl. in GC? --     Most recent vital signs: Vitals:   04/07/22 0555 04/07/22 0645  BP: (!) 189/133 (!) 191/123  Pulse: 92 (!) 46  Resp: 20 20  Temp: 98.3 F (36.8 C)   SpO2: 94% 96%    CONSTITUTIONAL: Alert and oriented  and responds appropriately to questions.  Obese, in no distress HEAD: Normocephalic, atraumatic EYES: Conjunctivae clear, pupils appear equal, sclera nonicteric ENT: normal nose; moist mucous membranes NECK: Supple, normal ROM CARD: RRR; S1 and S2 appreciated; no murmurs, no clicks, no rubs, no gallops CHEST:  Chest wall is nontender to palpation.  No crepitus, ecchymosis, erythema, warmth, rash or other lesions present.   RESP: Normal chest excursion without splinting or tachypnea; breath sounds clear and equal bilaterally; no wheezes, no rhonchi, no rales, no hypoxia or respiratory distress, speaking full sentences ABD/GI: Normal bowel sounds; non-distended; soft, non-tender, no rebound, no guarding, no peritoneal signs BACK: The back appears normal EXT: Normal ROM in all joints; no deformity noted, no  edema; no cyanosis, no calf tenderness or calf swelling SKIN: Normal color for age and race; warm; no rash on exposed skin NEURO: Moves all extremities equally, normal speech PSYCH: The patient's mood and manner are appropriate.   ED Results / Procedures / Treatments   LABS: (all labs ordered are listed, but only abnormal results are displayed) Labs Reviewed  BASIC METABOLIC PANEL - Abnormal; Notable for the following components:      Result Value   Potassium 3.4 (*)    Glucose, Bld 134 (*)    Calcium 8.6 (*)    All other components within normal limits  BRAIN NATRIURETIC PEPTIDE - Abnormal; Notable for the following components:   B Natriuretic Peptide 143.3 (*)    All other components within normal limits  TROPONIN I (HIGH SENSITIVITY) - Abnormal; Notable for the following components:   Troponin I (High Sensitivity) 57 (*)    All other components within normal limits  RESP PANEL BY RT-PCR (FLU A&B, COVID) ARPGX2  CBC  D-DIMER, QUANTITATIVE  PROCALCITONIN  URINE DRUG SCREEN, QUALITATIVE ([facility] ONLY)     EKG:  EKG Interpretation  Date/Time:  Sunday April 07 2022 06:00:10 EDT Ventricular Rate:  89 PR Interval:  182 QRS Duration: 110 QT Interval:  402 QTC Calculation: 489 R Axis:   86 Text Interpretation: Sinus rhythm with frequent Premature ventricular complexes Possible Anterior infarct , age undetermined Abnormal ECG When compared with ECG of 03-Jul-2013 10:12, Premature ventricular complexes are now Present Borderline criteria for Anterior infarct are now Present Nonspecific T wave abnormality no longer evident in Inferior leads Confirmed by Rochele Raring [phone]) on 04/07/2022 6:33:29 AM         RADIOLOGY: My personal review and interpretation of imaging: Chest x-ray concerning for acute bronchitis.  I have personally reviewed all radiology reports.   DG Chest 2 View  Result Date: 04/07/2022 CLINICAL DATA:  56 year old male with history of shortness of breath.  EXAM: CHEST - 2 VIEW COMPARISON:  No priors. FINDINGS: Lung volumes are normal. Scattered areas of interstitial prominence and peribronchial cuffing are noted throughout the mid to lower lungs bilaterally. No consolidative airspace disease. No pleural effusions. No pneumothorax. No pulmonary nodule or mass noted. Pulmonary vasculature and the cardiomediastinal silhouette are within normal limits. IMPRESSION: 1. Findings are concerning for an acute bronchitis, as above. Electronically Signed   By: Trudie Reed M.D.   On: 04/07/2022 06:23     PROCEDURES:  Critical Care performed: No      .1-3 Lead EKG Interpretation Performed by: Nichols Corter, Layla Maw, DO Authorized by: Jeanene Mena, Layla Maw, DO     Interpretation: normal     ECG rate:  92   ECG rate assessment: normal     Rhythm: sinus rhythm  Ectopy: none     Conduction: normal      IMPRESSION / MDM / ASSESSMENT AND PLAN / ED COURSE  I reviewed the triage vital signs and the nursing notes.    Patient here with complaints of chest pressure, shortness of breath, productive cough.  The patient is on the cardiac monitor to evaluate for evidence of arrhythmia and/or significant heart rate changes.   DIFFERENTIAL DIAGNOSIS (includes but not limited to):   COVID-19, influenza, pneumonia, CHF, PE, ACS, dissection, pneumothorax   PLAN: We will obtain CBC, BMP, troponin x2, BNP, COVID and flu swabs, chest x-ray, EKG.  Will give aspirin, nitroglycerin.   MEDICATIONS GIVEN IN ED: Medications  nitroGLYCERIN (NITROSTAT) SL tablet 0.4 mg (has no administration in time range)  hydrochlorothiazide (HYDRODIURIL) tablet 25 mg (has no administration in time range)  hydrALAZINE (APRESOLINE) injection 5 mg (has no administration in time range)  morphine (PF) 2 MG/ML injection 2 mg (has no administration in time range)  albuterol (VENTOLIN HFA) 108 (90 Base) MCG/ACT inhaler 2 puff (has no administration in time range)  dextromethorphan-guaiFENesin  (MUCINEX DM) 30-600 MG per 12 hr tablet 1 tablet (has no administration in time range)  aspirin chewable tablet 324 mg (324 mg Oral Given 04/07/22 0701)  lisinopril (ZESTRIL) tablet 20 mg (20 mg Oral Given 04/07/22 0701)  amLODipine (NORVASC) tablet 10 mg (10 mg Oral Given 04/07/22 0701)     ED COURSE: Patient's labs show no leukocytosis.  Normal hemoglobin.  Chest x-ray reviewed by myself and radiologist and shows bronchitis without pneumonia.  COVID and flu pending.  We will add on a procalcitonin.  No fever.  Suspect possible viral bronchitis rather than a bacterial infection.  I do not feel he needs antibiotics started at this time.  Doubt sepsis.    Patient is extremely hypertensive here.  He states he did not take his blood pressure medication this morning.  He takes lisinopril 20 mg, HCTZ 25 mg and amlodipine 10 mg.  We will give him home doses here.   His first troponin has come back elevated at 57.  There is no old for comparison.  He has a normal creatinine.  BNP is 143.  D-dimer negative.  Received full dose aspirin here.  Elevated troponin may be secondary to uncontrolled hypertension, hypertensive urgency but also could be indicative of an NSTEMI.  Second troponin ordered for 8 AM.  Will discuss with hospitalist for admission for blood pressure control, further cardiac monitoring, chest pain rule out.  Patient is still chest pain-free at this time.  Still feeling short of breath but states he is feeling better.  No hypoxia.    CONSULTS: 7:18 AM  Consulted and discussed patient's case with hospitalist, Dr. Clyde Lundborg.  I have recommended admission and consulting physician agrees and will place admission orders.  Patient (and family if present) agree with this plan.   I reviewed all nursing notes, vitals, pertinent previous records.  All labs, EKGs, imaging ordered have been independently reviewed and interpreted by myself.    OUTSIDE RECORDS REVIEWED: Reviewed patient's last office visit  with Bobby Rumpf on 02/19/2022.         FINAL CLINICAL IMPRESSION(S) / ED DIAGNOSES   Final diagnoses:  Acute bronchitis, unspecified organism  Hypertensive urgency  Chest pain, unspecified type  Elevated troponin     Rx / DC Orders   ED Discharge Orders     None        Note:  This document was  prepared using Conservation officer, historic buildings and may include unintentional dictation errors.   Jalisha Enneking, Layla Maw, DO 04/07/22 0650    Uyen Eichholz, Layla Maw, DO 04/07/22 1610

## 2022-04-07 NOTE — Assessment & Plan Note (Signed)
See above

## 2022-04-07 NOTE — ED Notes (Signed)
RN to bedside to introduce self to pt and repeat EKG per MD. Pt who is here for CP with elevated trop and is not hooked up to cardiac monitor. This RN placed pt on cardiac monitor.  ?

## 2022-04-07 NOTE — Assessment & Plan Note (Addendum)
Chest pain and elevated troponin: Troponin level 57 --> 58.  Patient possibly due to hypertensive emergency.  Patient chest pain has resolved currently. ?-Aspirin, Lipitor ?-Trend troponin ?-Check a UDS, A1c, FLP ?-As needed nitroglycerin and morphine ?-pt will need card referral ?

## 2022-04-08 DIAGNOSIS — E1169 Type 2 diabetes mellitus with other specified complication: Secondary | ICD-10-CM

## 2022-04-08 DIAGNOSIS — I10 Essential (primary) hypertension: Secondary | ICD-10-CM | POA: Diagnosis not present

## 2022-04-08 LAB — BASIC METABOLIC PANEL
Anion gap: 5 (ref 5–15)
BUN: 14 mg/dL (ref 6–20)
CO2: 32 mmol/L (ref 22–32)
Calcium: 8.6 mg/dL — ABNORMAL LOW (ref 8.9–10.3)
Chloride: 102 mmol/L (ref 98–111)
Creatinine, Ser: 1.19 mg/dL (ref 0.61–1.24)
GFR, Estimated: >60 mL/min (ref >60)
Glucose, Bld: 127 mg/dL — ABNORMAL HIGH (ref 70–99)
Potassium: 4 mmol/L (ref 3.5–5.1)
Sodium: 139 mmol/L (ref 135–145)

## 2022-04-08 LAB — GLUCOSE, CAPILLARY: Glucose-Capillary: 133 mg/dL — ABNORMAL HIGH (ref 70–99)

## 2022-04-08 MED ORDER — LIVING WELL WITH DIABETES BOOK
Freq: Once | Status: AC
  Filled 2022-04-08: qty 1

## 2022-04-08 MED ORDER — ATORVASTATIN CALCIUM 20 MG PO TABS: 20.0000 mg | ORAL_TABLET | Freq: Every day | ORAL | 0 refills | Status: DC

## 2022-04-08 MED ORDER — METFORMIN HCL 850 MG PO TABS: 850.0000 mg | ORAL_TABLET | Freq: Every day | ORAL | 0 refills | Status: DC

## 2022-04-08 MED ORDER — SERTRALINE HCL 50 MG PO TABS
50.0000 mg | ORAL_TABLET | Freq: Every day | ORAL | Status: AC
Start: 2022-04-09 — End: ?

## 2022-04-08 MED ORDER — SERTRALINE HCL 50 MG PO TABS
50.0000 mg | ORAL_TABLET | Freq: Every day | ORAL | Status: DC
  Administered 2022-04-08: 50 mg via ORAL
  Filled 2022-04-08: qty 1

## 2022-04-08 MED ORDER — LISINOPRIL 40 MG PO TABS: 40.0000 mg | ORAL_TABLET | Freq: Every day | ORAL | 0 refills | Status: DC

## 2022-04-08 MED ORDER — MELOXICAM 7.5 MG PO TABS
15.0000 mg | ORAL_TABLET | Freq: Every day | ORAL | Status: DC
  Administered 2022-04-08: 15 mg via ORAL
  Filled 2022-04-08: qty 2

## 2022-04-08 MED ORDER — HYDROCHLOROTHIAZIDE 25 MG PO TABS: 25.0000 mg | ORAL_TABLET | Freq: Every day | ORAL | 0 refills | Status: DC

## 2022-04-08 NOTE — Discharge Summary (Signed)
Physician Discharge Summary  ?BAXTON WITTBRODT HER:[id number] DOB: 03-14-1966 DOA: 04/07/2022 ? ?[facility]: [facility] ? ?Admit date: 04/07/2022 ?Discharge date: 04/08/2022 ? ?Admitted From: home  ?Disposition:  home  ? ?Recommendations for Outpatient Follow-up:  ?Follow up with PCP in 1 week  ? ?Home Health: no  ?Equipment/Devices: ? ?Discharge Condition: stable  ?CODE STATUS:full  ?Diet recommendation: Heart Healthy / Carb Modified  ? ?Brief/Interim Summary: ?HPI was taken from Dr. Clyde Lundborg: ?William Jimenez is a 56 y.o. male with medical history significant of HTN, HLD, OSA, morbid obesity with BMI 42.76, who presents with chest pain. ?  ?Patient states that he has intermittent chest pain for about 3 days, which is located in the left side of chest, pressure-like, sometimes pleuritic worse with deep breath, 3 out of 10 in severity initially, currently chest pain-free, nonradiating.  He states that he has OSA and has not started using CPAP yet. He states that he has shortness of breath at night sometimes.  He also has dry cough, no fever or chills.  No nausea, vomiting, diarrhea or abdominal pain.  No symptoms of UTI. ?  ?Data Reviewed and ED Course: pt was found to have trop 57 -->58, procalcitonin <0.10, WBC 8.1, negative D-dimer 0.28, negative COVID PCR, GFR> 60, potassium 3.4, temperature normal, blood pressure 191/123, heart rate 92, 46, RR 20, oxygen saturation 96% on room air.  Chest x-ray showed possible acute bronchitis.  Patient initially placed on cardiac telemetry bed for observation. ? ?As per Dr. Mayford Knife 04/08/22: Pt was found to have HTN urgency likely secondary to poorly controlled HTN. Pt's BP improved w/ increase dose of lisinopril from 20mg  daily to 40mg  daily. Pt was continued on amlodipine, lisinopril, & HCTZ. Of note, pt's HbA1c 6.7 which is dx of DM2, new onset. Pt received DM education from myself as well as the DM coordinator. Pt was d/c home on po metformin. Pt will need to f/u outpatient  w/ his PCP w/in 1 week. Finally, pt has hx of OSA but cannot afford CPAP so TOC consulted to potentially assist pt in getting CPAP as pt has previously had 2 sleep studies.  ? ?Discharge Diagnoses:  ?Principal Problem: ?  Hypertensive emergency ?Active Problems: ?  Hypertension ?  Elevated troponin ?  Chest pain ?  HLD (hyperlipidemia) ?  Hypokalemia ?  Obesity, Class III, BMI 40-49.9 (morbid obesity) (HCC) ?  Cough ?  OSA (obstructive sleep apnea) ? ?Hypertensive emergency: continue on amlodipine, lisinopril, HCTZ. IV hydralazine prn. HTN emergency resolved  ?  ?HTN: poorly controlled. Continue on increase dose of lisinopril, amlodipine, HCTZ. IV hydralazine prn  ? ?DM2: new onset. HbA1c 6.7. Received DM2 education. Started on metformin at d/c  ?  ?Chest pain: resolved. ?  ?Elevated troponin: likely secondary to demand ischemia. Trending down ? ?HLD: continue on statin  ?  ?Hypokalemia: WNL today  ?  ?Morbid obesity: BMI 42.7. Complicates overall care & prognosis  ?  ?Likely acute bronchitis: continue w/ bronchodilators, mucinex & supportive care. No fever or leukocytosis. Procal <0.10. COVID19 neg  ? ?OSA: CPAP qhs. Consulted TOC for assistance in getting CPAP at home   ? ?Discharge Instructions ? ?Discharge Instructions   ? ? Diet - low sodium heart healthy   Complete by: As directed ?  ? Diet Carb Modified   Complete by: As directed ?  ? Discharge instructions   Complete by: As directed ?  ? F/u w/ PCP in 1 week. New onset diabetes  mellitus type II. HbA1c 6.7  ? Increase activity slowly   Complete by: As directed ?  ? ?  ? ?Allergies as of 04/08/2022   ?No Known Allergies ?  ? ?  ?Medication List  ?  ? ?STOP taking these medications   ? ?clomiPHENE 50 MG tablet ?Commonly known as: CLOMID ?  ?lisinopril-hydrochlorothiazide 20-25 MG tablet ?Commonly known as: ZESTORETIC ?  ?NONFORMULARY OR COMPOUNDED ITEM ?  ? ?  ? ?TAKE these medications   ? ?alprostadil 1000 MCG pellet ?Commonly known as: MUSE ?1 each (1,000 mcg  total) by Transurethral route as needed for erectile dysfunction. use no more than 3 times per week ?  ?amLODipine 10 MG tablet ?Commonly known as: NORVASC ?Take 10 mg by mouth daily. ?  ?aspirin EC 81 MG tablet ?Take 81 mg by mouth daily. ?  ?atorvastatin 20 MG tablet ?Commonly known as: LIPITOR ?Take 1 tablet (20 mg total) by mouth daily. ?Start taking on: April 09, 2022 ?  ?hydrochlorothiazide 25 MG tablet ?Commonly known as: HYDRODIURIL ?Take 1 tablet (25 mg total) by mouth daily. ?Start taking on: April 09, 2022 ?  ?lisinopril 40 MG tablet ?Commonly known as: ZESTRIL ?Take 1 tablet (40 mg total) by mouth daily. ?Start taking on: April 09, 2022 ?  ?meloxicam 15 MG tablet ?Commonly known as: MOBIC ?Take 15 mg by mouth daily. ?  ?metFORMIN 850 MG tablet ?Commonly known as: Glucophage ?Take 1 tablet (850 mg total) by mouth daily with breakfast. ?  ?sertraline 50 MG tablet ?Commonly known as: ZOLOFT ?Take 1 tablet (50 mg total) by mouth daily. ?Start taking on: April 09, 2022 ?  ? ?  ? ? Follow-up Information   ? ? Schedule an appointment as soon as possible for a visit  with [facility].   ?Specialty: General Practice ?Contact information: ?[address] ?[place] [address] ?[phone] ? ? ?  ?  ? ?  ?  ? ?  ? ?No Known Allergies ? ?Consultations: ? ? ? ?Procedures/Studies: ?DG Chest 2 View ? ?Result Date: 04/07/2022 ?CLINICAL DATA:  56 year old male with history of shortness of breath. EXAM: CHEST - 2 VIEW COMPARISON:  No priors. FINDINGS: Lung volumes are normal. Scattered areas of interstitial prominence and peribronchial cuffing are noted throughout the mid to lower lungs bilaterally. No consolidative airspace disease. No pleural effusions. No pneumothorax. No pulmonary nodule or mass noted. Pulmonary vasculature and the cardiomediastinal silhouette are within normal limits. IMPRESSION: 1. Findings are concerning for an acute bronchitis, as above. Electronically Signed   By: Trudie Reed M.D.   On: 04/07/2022 06:23   ?(Echo, Carotid, EGD, Colonoscopy, ERCP)  ? ? ?Subjective: Pt c/o chronic back pain  ? ? ?Discharge Exam: ?Vitals:  ? 04/08/22 0831 04/08/22 1153  ?BP: (!) 146/112 140/86  ?Pulse: 86 95  ?Resp: 16 18  ?Temp: 97.6 ?F (36.4 ?C) 98.1 ?F (36.7 ?C)  ?SpO2: 96% 98%  ? ?Vitals:  ? 04/08/22 0440 04/08/22 0521 04/08/22 0831 04/08/22 1153  ?BP:  (!) 153/105 (!) 146/112 140/86  ?Pulse:  82 86 95  ?Resp:   16 18  ?Temp:   97.6 ?F (36.4 ?C) 98.1 ?F (36.7 ?C)  ?TempSrc:      ?SpO2:   96% 98%  ?Weight: 135.2 kg     ?Height:      ? ? ?General: Pt is alert, awake, not in acute distress. Morbid obesity  ?Cardiovascular: S1/S2 +, no rubs, no gallops ?Respiratory: CTA bilaterally,  no wheezing, no rhonchi ?Abdominal: Soft, NT, obese, bowel sounds + ?Extremities: no cyanosis ? ? ? ?The results of significant diagnostics from this hospitalization (including imaging, microbiology, ancillary and laboratory) are listed below for reference.   ? ? ?Microbiology: ?Recent Results (from the past 240 hour(s))  ?Resp Panel by RT-PCR (Flu A&B, Covid) Nasopharyngeal Swab     Status: None  ? Collection Time: 04/07/22  6:22 AM  ? Specimen: Nasopharyngeal Swab; Nasopharyngeal(NP) swabs in vial transport medium  ?Result Value Ref Range Status  ? SARS Coronavirus 2 by RT PCR NEGATIVE NEGATIVE Final  ?  Comment: (NOTE) ?SARS-CoV-2 target nucleic acids are NOT DETECTED. ? ?The SARS-CoV-2 RNA is generally detectable in upper respiratory ?specimens during the acute phase of infection. The lowest ?concentration of SARS-CoV-2 viral copies this assay can detect is ?138 copies/mL. A negative result does not preclude SARS-Cov-2 ?infection and should not be used as the sole basis for treatment or ?other patient management decisions. A negative result may occur with  ?improper specimen collection/handling, submission of specimen other ?than nasopharyngeal swab, presence of viral mutation(s) within the ?areas targeted by this  assay, and inadequate number of viral ?copies(<138 copies/mL). A negative result must be combined with ?clinical observations, patient history, and epidemiological ?information. The expected result is N

## 2022-04-08 NOTE — Progress Notes (Signed)
Nutrition Education Note ? ?RD consulted for nutrition education regarding diabetes.  ? ?Lab Results  ?Component Value Date  ? HGBA1C 6.7 (H) 04/07/2022  ? ?Attempted to speak with pt x 2 regarding new DM diagnosis. Pt was sitting in chair, talking on cell phone at times of both visits and did not acknowledge RD presence.  ? ?RD provided "Carbohydrate Counting for People with Diabetes" handout from the Academy of Nutrition and Dietetics. Attached to AVS/ discharge summary.  ? ?RD also referred pt to [facility]'s Nutrition and Diabetes Education Services for further reinforcement.  ? ?Body mass index is 42.77 kg/m?Marland Kitchen Pt meets criteria for extreme obesity, class III based on current BMI. ? ?Current diet order is Heart Healthy, patient is consuming approximately n/a% of meals at this time. Labs and medications reviewed. No further nutrition interventions warranted at this time. RD contact information provided. If additional nutrition issues arise, please re-consult RD. ? ?Loistine Chance, RD, LDN, CDCES ?Registered Dietitian II ?Certified Diabetes Care and Education Specialist ?Please refer to [facility] for RD and/or RD on-call/weekend/after hours pager  ?

## 2022-04-08 NOTE — Progress Notes (Signed)
Per [employer], for patient to get a CPAP he would need a sleep study within a year. Patient's most recently sleep study was 2021 in which he reports he was unable to afford CPAP at the time. Patient aware to go to PCP to get sleep study.  ? Angeline Slim, [place] ?[phone] ? ?

## 2022-04-08 NOTE — Discharge Summary (Signed)
D/c summary was miss-labeled as a progress note on 04/08/22. Please see that note for full d/c summary  ?

## 2022-04-08 NOTE — Progress Notes (Signed)
Inpatient Diabetes Program Recommendations ? ?AACE/ADA: New Consensus Statement on Inpatient Glycemic Control  ? ?Target Ranges:  Prepandial:   less than 140 mg/dL ?     Peak postprandial:   less than 180 mg/dL (1-2 hours) ?     Critically ill patients:  140 - 180 mg/dL  ? ? Latest Reference Range & Units 04/08/22 08:39  ?Glucose-Capillary 70 - 99 mg/dL 277 (H)  ? ? Latest Reference Range & Units 04/07/22 14:54  ?Hemoglobin A1C 4.8 - 5.6 % 6.7 (H)  ? ?Review of Glycemic Control ? ?Diabetes history: No ?Outpatient Diabetes medications: NA ?Current orders for Inpatient glycemic control: None ? ?Inpatient Diabetes Program Recommendations:   ? ?HbgA1C: A1C 6.7% on 04/07/22 indicating an average glucose of 146 mg/dl over the past 2-3 months. ? ? ?NOTE: Spoke with patient about new diabetes diagnosis.  Patient reports that his PCP told him his glucose was elevated several years ago and had put him on Metformin. Patient states he took the Metformin for a while and when his levels were checked again he was told his levels were good so he stopped the Metformin. Patient reports that was about 2 years ago when he was on the Metformin. Patient reports that he has not seen his PCP in several months (over 6 months). Patient states that he has had COVID in the past few months and was on Prednisone in January when he had COVID last.  Discussed A1C results (6.7% on 04/07/22) and explained what an A1C is and informed patient that his current A1C indicates an average glucose of 147 mg/dl over the past 2-3 months. Discussed basic pathophysiology of DM Type 2, basic home care, importance of checking CBGs and maintaining good CBG control to prevent long-term and short-term complications. Reviewed glucose and A1C goals. Discussed impact of nutrition, exercise, stress, sickness, and medications on diabetes control. Informed patient that a Living Well with diabetes booklet was ordered and encouraged patient to read through entire book once  received.  Patient states that his wife use to work in healthcare and she will be able to help him with DM management.  Encouraged patient to eliminate sugary beverages and start following carb modified diet. Patient reports that the provider told him he will likely be started on Metformin at discharge. Patient reports that he tolerated Metformin 500 mg daily well. Discussed potential side effects of GI upset with Metformin. Encouraged patient to take medication as prescribed and to be sure to follow up with PCP.  Patient verbalized understanding of information discussed and he states that he has no further questions at this time related to diabetes.    ? ?Thanks, ?Orlando Penner, RN, MSN, CDE ?Diabetes Coordinator ?Inpatient Diabetes Program ?[phone] (Team Pager from 8am to 5pm) ? ? ? ?

## 2022-04-09 LAB — HIV ANTIBODY (ROUTINE TESTING W REFLEX): HIV Screen 4th Generation wRfx: NONREACTIVE

## 2022-04-29 ENCOUNTER — Ambulatory Visit: Payer: PRIVATE HEALTH INSURANCE | Admitting: *Deleted

## 2022-05-31 ENCOUNTER — Encounter: Payer: Self-pay | Admitting: Urology

## 2022-05-31 ENCOUNTER — Telehealth: Payer: Self-pay | Admitting: Urology

## 2022-05-31 ENCOUNTER — Ambulatory Visit: Payer: No Typology Code available for payment source | Admitting: Urology

## 2022-05-31 NOTE — Telephone Encounter (Signed)
LMOM asking pt to return call.  

## 2022-05-31 NOTE — Telephone Encounter (Signed)
William Jimenez missed his 93-month follow-up with [place] today for recheck on his intermediate risk hematuria.  Would you call him to get this rescheduled?

## 2022-05-31 NOTE — Progress Notes (Incomplete)
05/31/22 6:56 AM   William Jimenez February 09, 1966 [id number]  Referring provider:  Center, [facility] [address] [place],  [place] [address] No chief complaint on file.   Urological history  1. ED -contributing factors of age, diabetes, BPH, HTN and testosterone deficiency -SHIM ***   2. BPH with LU TS -PSA pending -cysto 2020 - mild BPH -I PSS ***   3. Testosterone deficiency -contributing factors of age, diabetes and obesity -testosterone level pending   4. Intermediate risk hematuria -non-smoker -work up in 2020 NED -no reports of gross heme   HPI: William Jimenez is a 56 y.o.male who presents today for a 6 month follow-up with UA.       PMH: Past Medical History:  Diagnosis Date   HLD (hyperlipidemia)    Hypertension     Surgical History: Past Surgical History:  Procedure Laterality Date   CYSTOSCOPY      Home Medications:  Allergies as of 05/31/2022   No Known Allergies      Medication List        Accurate as of May 31, 2022  6:56 AM. If you have any questions, ask your nurse or doctor.          alprostadil 1000 MCG pellet Commonly known as: MUSE 1 each (1,000 mcg total) by Transurethral route as needed for erectile dysfunction. use no more than 3 times per week   amLODipine 10 MG tablet Commonly known as: NORVASC Take 10 mg by mouth daily.   aspirin EC 81 MG tablet Take 81 mg by mouth daily.   atorvastatin 20 MG tablet Commonly known as: LIPITOR Take 1 tablet (20 mg total) by mouth daily.   hydrochlorothiazide 25 MG tablet Commonly known as: HYDRODIURIL Take 1 tablet (25 mg total) by mouth daily.   lisinopril 40 MG tablet Commonly known as: ZESTRIL Take 1 tablet (40 mg total) by mouth daily.   meloxicam 15 MG tablet Commonly known as: MOBIC Take 15 mg by mouth daily.   metFORMIN 850 MG tablet Commonly known as: Glucophage Take 1 tablet (850 mg total) by mouth daily with breakfast.   sertraline 50 MG  tablet Commonly known as: ZOLOFT Take 1 tablet (50 mg total) by mouth daily.        Allergies:  No Known Allergies  Family History: Family History  Problem Relation Age of Onset   Prostate cancer Neg Hx    Bladder Cancer Neg Hx    Kidney cancer Neg Hx     Social History:  reports that he has never smoked. He uses smokeless tobacco. He reports that he does not drink alcohol and does not use drugs.   Physical Exam: There were no vitals taken for this visit.  Constitutional:  Alert and oriented, No acute distress. HEENT: [place] AT, moist mucus membranes.  Trachea midline, no masses. Cardiovascular: No clubbing, cyanosis, or edema. Respiratory: Normal respiratory effort, no increased work of breathing. GI: Abdomen is soft, nontender, nondistended, no abdominal masses GU: No CVA tenderness Lymph: No cervical or inguinal lymphadenopathy. Skin: No rashes, bruises or suspicious lesions. Neurologic: Grossly intact, no focal deficits, moving all 4 extremities. Psychiatric: Normal mood and affect.  Laboratory Data: Lab Results  Component Value Date   CREATININE 1.19 04/08/2022   Lab Results  Component Value Date   HGBA1C 6.7 (H) 04/07/2022   Component     Latest Ref Rng 04/07/2022 04/08/2022  Sodium     135 - 145 mmol/L 136  139   Potassium     3.5 - 5.1 mmol/L 3.4 (L)  4.0   Chloride     98 - 111 mmol/L 100  102   CO2     22 - 32 mmol/L 30  32   Glucose     70 - 99 mg/dL 379 (H)  024 (H)   BUN     6 - 20 mg/dL 15  14   Creatinine     0.61 - 1.24 mg/dL 0.97  3.53   Calcium     8.9 - 10.3 mg/dL 8.6 (L)  8.6 (L)   GFR, Estimated     >60 mL/min >60  >60   Anion gap     5 - 15  6  5      Legend: (L) Low (H) High  Urinalysis   Pertinent Imaging:   Assessment & Plan:     No follow-ups on file.  [facility] [address], [address] [place], [place] [phone]  I,Kailey Littlejohn,acting as a scribe for [facility], PA-C.,have documented all relevant documentation on the behalf of SHANNON MCGOWAN, PA-C,as directed by  [facility], PA-C while in the presence of SHANNON MCGOWAN, PA-C.

## 2022-06-03 ENCOUNTER — Ambulatory Visit: Payer: PRIVATE HEALTH INSURANCE | Admitting: *Deleted

## 2022-06-07 ENCOUNTER — Encounter: Payer: Self-pay | Admitting: Urology

## 2022-06-24 IMAGING — CT CT ABD-PEL WO/W CM
3 of 12 series · 12 of 46 positions shown, 18 images · IV contrast (omnipaque)
Comparison: None.

CLINICAL DATA: Microscopic hematuria

EXAM:
CT ABDOMEN AND PELVIS WITHOUT AND WITH CONTRAST
TECHNIQUE: Multidetector CT imaging of the abdomen and pelvis was performed
following the standard protocol before and following the bolus
administration of intravenous contrast.
CONTRAST:  100mL OMNIPAQUE IOHEXOL 350 MG/ML SOLN

[Series 5: cor without without pre 2.00 cor · coronal · non-contrast · 0.89mm/px · 2 of 208 slices shown, 3 images]
[im 70/208  soft-tissue]
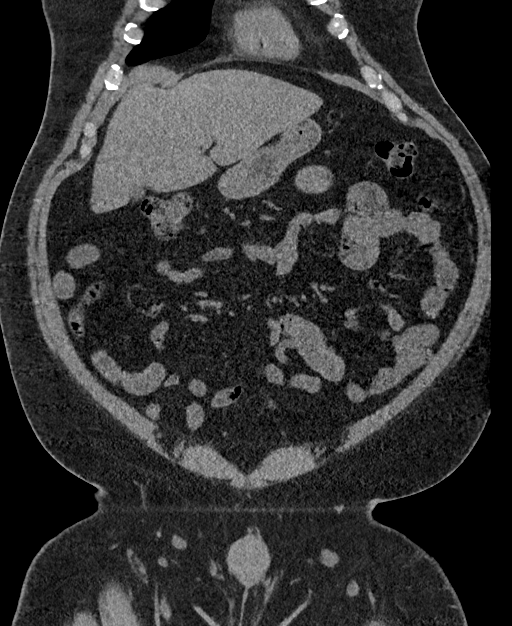
[im 70/208  bone]
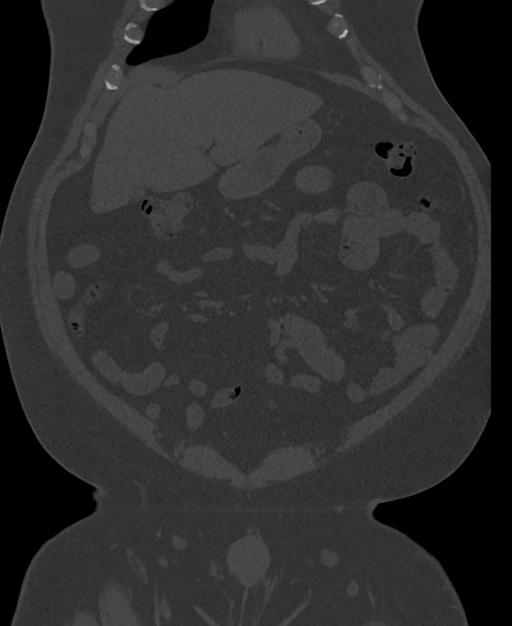
[im 139/208  soft-tissue]
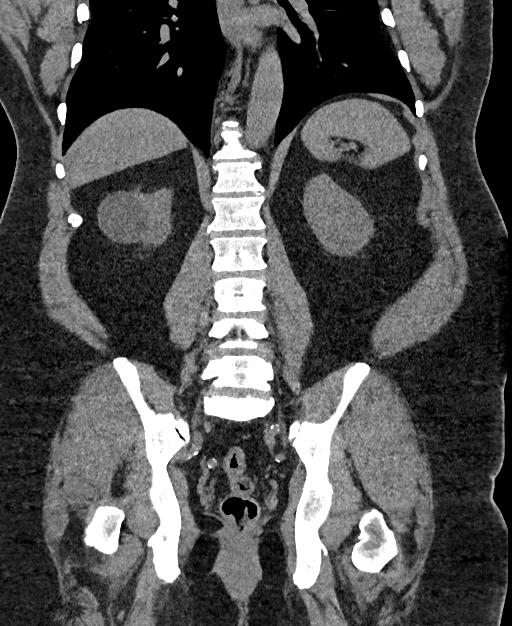

[Series 9: axial with hematuria with 5.00 · axial · 0.89mm/px · z∈[-1549,-1154]mm · 6 of 111 slices shown, 11 images]
[im 16/111  soft-tissue]
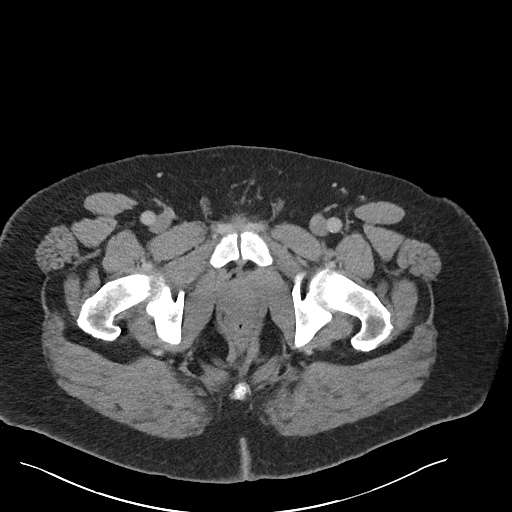
[im 16/111  bone]
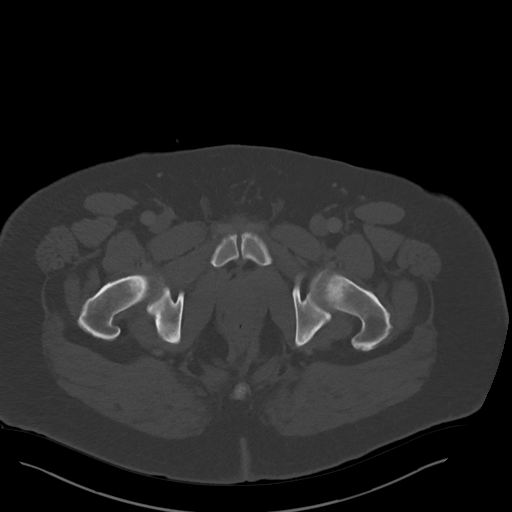
[im 32/111  soft-tissue]
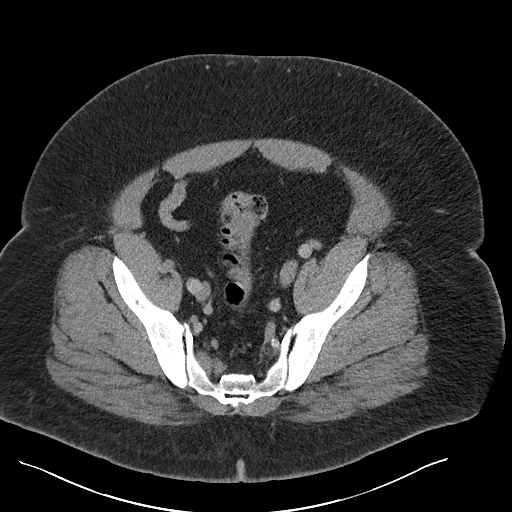
[im 48/111  soft-tissue]
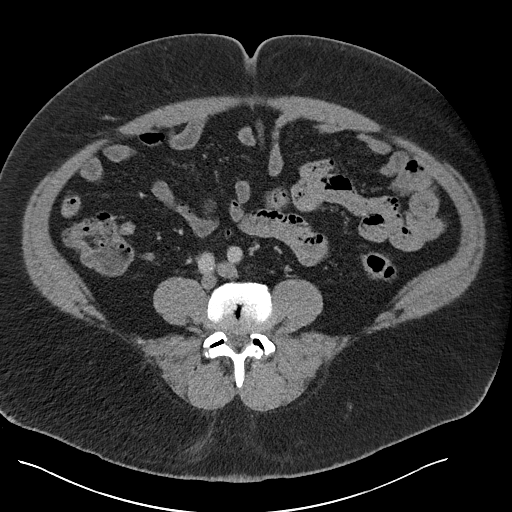
[im 48/111  lung]
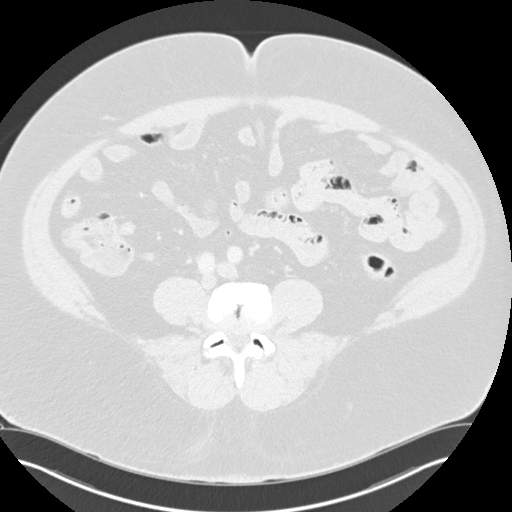
[im 63/111  soft-tissue]
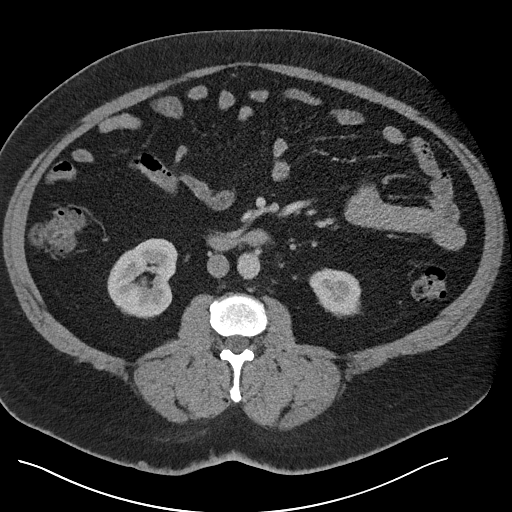
[im 63/111  lung]
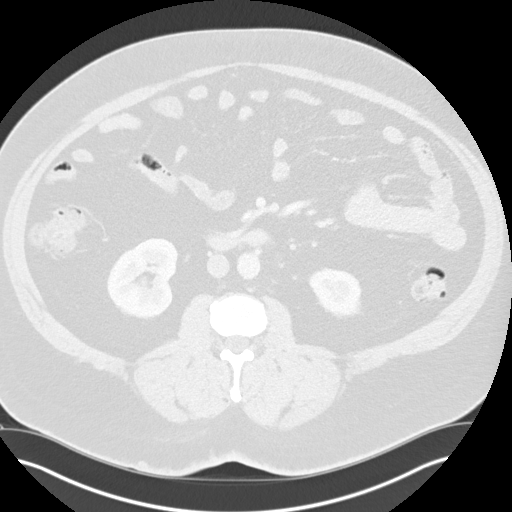
[im 79/111  soft-tissue]
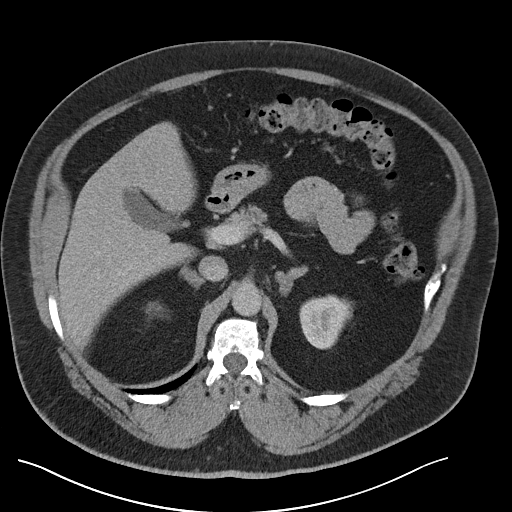
[im 79/111  lung]
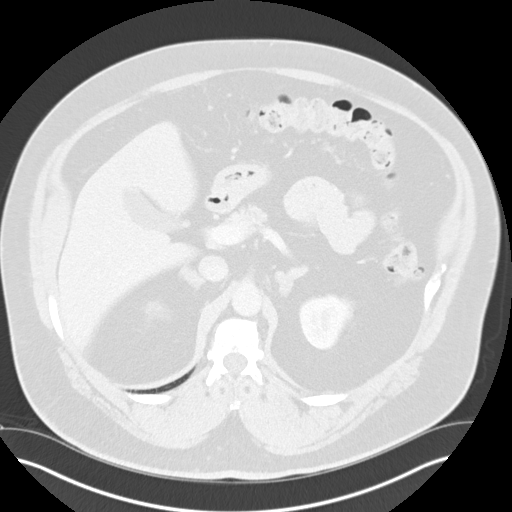
[im 95/111  soft-tissue]
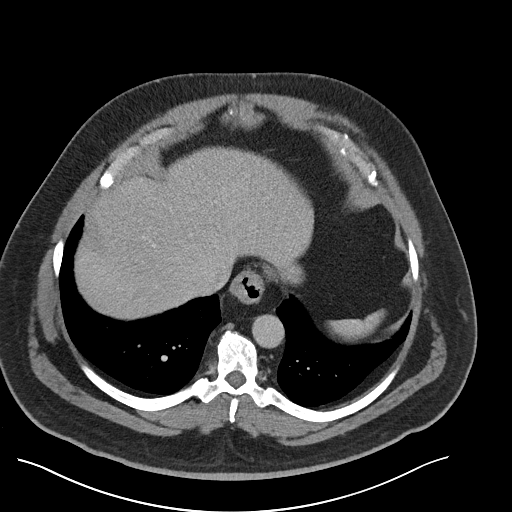
[im 95/111  lung]
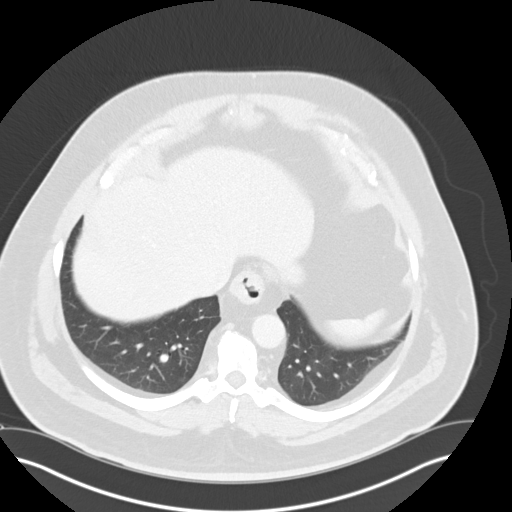

[Series 17: axial delay delay prone 5.00 · axial · delayed · 0.89mm/px · z∈[-1572,-1322]mm · 4 of 117 slices shown]
[im 17/117  soft-tissue]
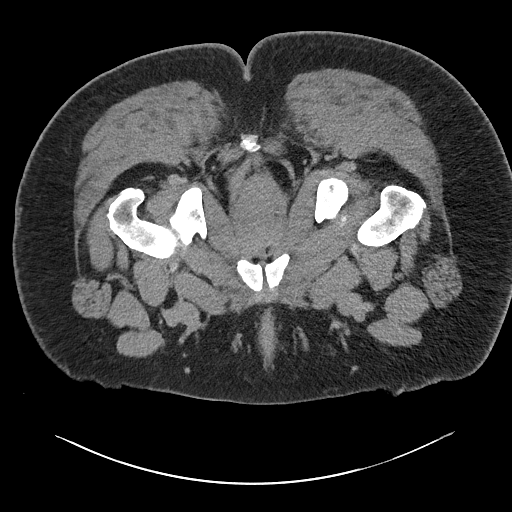
[im 34/117  soft-tissue]
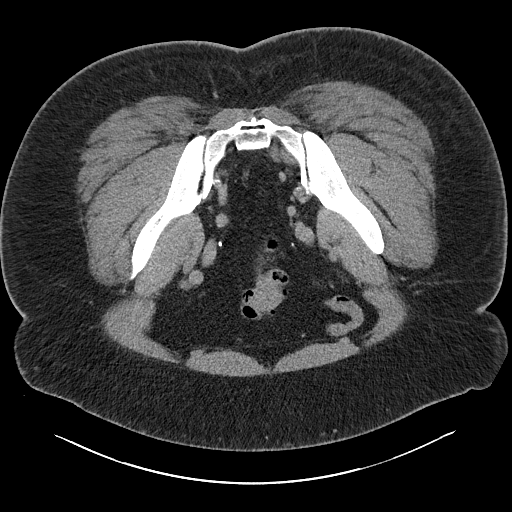
[im 50/117  soft-tissue]
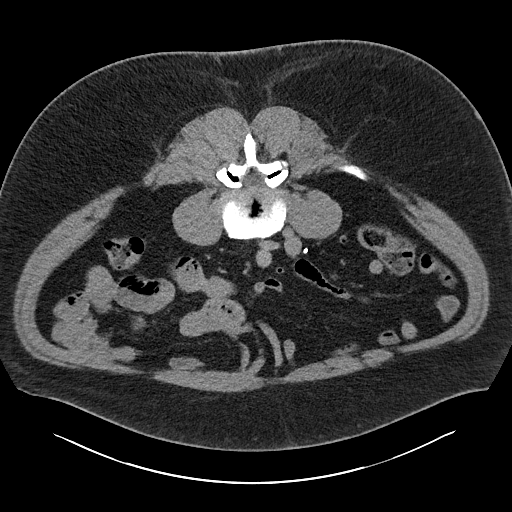
[im 67/117  soft-tissue]
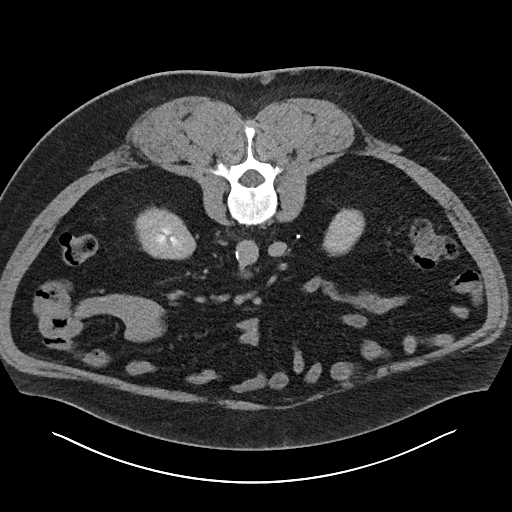

[12 of 46 positions shown; findings below may reference images not displayed]

FINDINGS: Lower chest: No acute abnormality.  Small hiatal hernia.

Hepatobiliary: No solid liver abnormality is seen. No gallstones,
gallbladder wall thickening, or biliary dilatation.

Pancreas: Unremarkable. No pancreatic ductal dilatation or
surrounding inflammatory changes.

Spleen: Normal in size without significant abnormality.

Adrenals/Urinary Tract: Benign, non nodular adenomatous thickening
of the adrenal glands. Kidneys are normal, without renal calculi,
solid lesion, or hydronephrosis. No urinary tract filling defect on
delayed phase imaging. Bladder is unremarkable.

Stomach/Bowel: Stomach is within normal limits. Appendix appears
normal. No evidence of bowel wall thickening, distention, or
inflammatory changes.

Vascular/Lymphatic: Aortic atherosclerosis. No enlarged abdominal or
pelvic lymph nodes.

Reproductive: No mass or other significant abnormality.

Other: No abdominal wall hernia or abnormality. No abdominopelvic
ascites.

Musculoskeletal: No acute or significant osseous findings.
IMPRESSION: 1. No CT findings to explain hematuria. No evidence of urinary tract
calculus, mass, or hydronephrosis. No urinary tract filling defect
on delayed phase imaging.
2. Small hiatal hernia.

Aortic Atherosclerosis ([id number]).

## 2022-07-12 ENCOUNTER — Other Ambulatory Visit: Payer: Self-pay

## 2022-07-12 ENCOUNTER — Emergency Department: Payer: No Typology Code available for payment source

## 2022-07-12 ENCOUNTER — Emergency Department
Admission: EM | Admit: 2022-07-12 | Discharge: 2022-07-13 | Disposition: A | Discharge status: Home / Self Care | Payer: No Typology Code available for payment source | Attending: Emergency Medicine | Admitting: Emergency Medicine

## 2022-07-12 DIAGNOSIS — R0602 Shortness of breath: Secondary | ICD-10-CM | POA: Diagnosis present

## 2022-07-12 DIAGNOSIS — R778 Other specified abnormalities of plasma proteins: Secondary | ICD-10-CM | POA: Insufficient documentation

## 2022-07-12 LAB — CBC WITH DIFFERENTIAL/PLATELET
Abs Immature Granulocytes: 0.03 10*3/uL (ref 0.00–0.07)
Basophils Absolute: 0.1 10*3/uL (ref 0.0–0.1)
Basophils Relative: 1 %
Eosinophils Absolute: 0.1 10*3/uL (ref 0.0–0.5)
Eosinophils Relative: 1 %
HCT: 40.6 % (ref 39.0–52.0)
Hemoglobin: 13 g/dL (ref 13.0–17.0)
Immature Granulocytes: 0 %
Lymphocytes Relative: 29 %
Lymphs Abs: 2.1 10*3/uL (ref 0.7–4.0)
MCH: 26.4 pg (ref 26.0–34.0)
MCHC: 32 g/dL (ref 30.0–36.0)
MCV: 82.5 fL (ref 80.0–100.0)
Monocytes Absolute: 0.6 10*3/uL (ref 0.1–1.0)
Monocytes Relative: 8 %
Neutro Abs: 4.5 10*3/uL (ref 1.7–7.7)
Neutrophils Relative %: 61 %
Platelets: 280 10*3/uL (ref 150–400)
RBC: 4.92 MIL/uL (ref 4.22–5.81)
RDW: 14.9 % (ref 11.5–15.5)
WBC: 7.5 10*3/uL (ref 4.0–10.5)
nRBC: 0 % (ref 0.0–0.2)

## 2022-07-12 LAB — COMPREHENSIVE METABOLIC PANEL
ALT: 152 U/L — ABNORMAL HIGH (ref 0–44)
AST: 136 U/L — ABNORMAL HIGH (ref 15–41)
Albumin: 3.7 g/dL (ref 3.5–5.0)
Alkaline Phosphatase: 69 U/L (ref 38–126)
Anion gap: 5 (ref 5–15)
BUN: 18 mg/dL (ref 6–20)
CO2: 28 mmol/L (ref 22–32)
Calcium: 8.4 mg/dL — ABNORMAL LOW (ref 8.9–10.3)
Chloride: 106 mmol/L (ref 98–111)
Creatinine, Ser: 1.48 mg/dL — ABNORMAL HIGH (ref 0.61–1.24)
GFR, Estimated: 56 mL/min — ABNORMAL LOW (ref >60)
Glucose, Bld: 120 mg/dL — ABNORMAL HIGH (ref 70–99)
Potassium: 4 mmol/L (ref 3.5–5.1)
Sodium: 139 mmol/L (ref 135–145)
Total Bilirubin: 1 mg/dL (ref 0.3–1.2)
Total Protein: 6.5 g/dL (ref 6.5–8.1)

## 2022-07-12 LAB — TROPONIN I (HIGH SENSITIVITY)
Troponin I (High Sensitivity): 56 ng/L — ABNORMAL HIGH (ref <18)
Troponin I (High Sensitivity): 59 ng/L — ABNORMAL HIGH (ref <18)

## 2022-07-12 MED ORDER — IPRATROPIUM-ALBUTEROL 0.5-2.5 (3) MG/3ML IN SOLN
3.0000 mL | Freq: Once | RESPIRATORY_TRACT | Status: AC
  Administered 2022-07-12: 3 mL via RESPIRATORY_TRACT
  Filled 2022-07-12: qty 3

## 2022-07-12 NOTE — ED Notes (Signed)
Pt denies SHOB at this time. States that mostly happens with exertion and that he starting noticing that it has worsened this week and was hearing some wheezing.  Has bipap for night use but has not started using it yet. Also states that he ran out of his BP medication amlodipine and lisinopril since early this week.  Waiting on doc to get with pharmacy for refill.BP 173/118 at this time.

## 2022-07-12 NOTE — ED Notes (Signed)
EDP Goodman in room at this time. 

## 2022-07-12 NOTE — ED Triage Notes (Signed)
Pt to ED via wheelchair from [facility] with c/o high blood pressure. Pt states has taken his BP meds today, denies any symptoms with his HTN.  Reading at [facility] as follows:  183/127, 168/102. Pt A&O x4.

## 2022-07-12 NOTE — ED Provider Notes (Signed)
[facility] Provider Note    Event Date/Time   First MD Initiated Contact with Patient 07/12/22 2215     (approximate)   History   Shortness of Breath   HPI {Remember to add pertinent medical, surgical, social, and/or OB history to HPI:1} William Jimenez is a 56 y.o. male  ***       Physical Exam   Triage Vital Signs: ED Triage Vitals  Enc Vitals Group     BP 07/12/22 1802 (!) 167/85     Pulse Rate 07/12/22 1802 92     Resp 07/12/22 1802 19     Temp 07/12/22 1802 (!) 97.5 F (36.4 C)     Temp Source 07/12/22 1802 Oral     SpO2 07/12/22 1802 99 %     Weight 07/12/22 1803 (!) 361 lb (163.7 kg)     Height 07/12/22 1803 5\' 10"  (1.778 m)     Head Circumference --      Peak Flow --      Pain Score 07/12/22 1803 2     Pain Loc --      Pain Edu? --      Excl. in GC? --     Most recent vital signs: Vitals:   07/12/22 2114 07/12/22 2130  BP:  (!) 162/123  Pulse:  84  Resp:  (!) 23  Temp:    SpO2: 97%     {Only need to document appropriate and relevant physical exam:1} General: Awake, no distress. *** CV:  Good peripheral perfusion. *** Resp:  Normal effort. *** Abd:  No distention. *** Other:  ***   ED Results / Procedures / Treatments   Labs (all labs ordered are listed, but only abnormal results are displayed) Labs Reviewed  COMPREHENSIVE METABOLIC PANEL - Abnormal; Notable for the following components:      Result Value   Glucose, Bld 120 (*)    Creatinine, Ser 1.48 (*)    Calcium 8.4 (*)    AST 136 (*)    ALT 152 (*)    GFR, Estimated 56 (*)    All other components within normal limits  TROPONIN I (HIGH SENSITIVITY) - Abnormal; Notable for the following components:   Troponin I (High Sensitivity) 56 (*)    All other components within normal limits  TROPONIN I (HIGH SENSITIVITY) - Abnormal; Notable for the following components:   Troponin I (High Sensitivity) 59 (*)    All other components within normal limits  CBC WITH  DIFFERENTIAL/PLATELET     EKG  I, 07/14/22, attending physician, personally viewed and interpreted this EKG  EKG Time: 1803 Rate: 90 Rhythm: sinus rhythm with pac Axis: normal Intervals: qtc 423 QRS: narrow, q waves v1 ST changes: no st elevation Impression: abnormal ekg  RADIOLOGY I independently interpreted and visualized the CXR. My interpretation: No pneumonia. No pneumothorax.  Radiology interpretation:  IMPRESSION:  Low lung volumes.  No acute cardiopulmonary disease.      PROCEDURES:  Critical Care performed: {CriticalCareYesNo:19197::"Yes, see critical care procedure note(s)","No"}  Procedures   MEDICATIONS ORDERED IN ED: Medications - No data to display   IMPRESSION / MDM / ASSESSMENT AND PLAN / ED COURSE  I reviewed the triage vital signs and the nursing notes.                              Differential diagnosis includes, but is not limited to, ***  Patient's presentation is most consistent with {EM COPA:27473}  {If the patient is on the monitor, remove the brackets and asterisks on the sentence below and remember to document it as a Procedure as well. Otherwise delete the sentence below:1} {**The patient is on the cardiac monitor to evaluate for evidence of arrhythmia and/or significant heart rate changes.**} {Remember to include, when applicable, any/all of the following data: independent review of imaging independent review of labs (comment specifically on pertinent positives and negatives) review of specific prior hospitalizations, PCP/specialist notes, etc. discuss meds given and prescribed document any discussion with consultants (including hospitalists) any clinical decision tools you used and why (PECARN, NEXUS, etc.) did you consider admitting the patient? document social determinants of health affecting patient's care (homelessness, inability to follow up in a timely fashion, etc) document any pre-existing conditions increasing risk  on current visit (e.g. diabetes and HTN increasing danger of high-risk chest pain/ACS) describes what meds you gave (especially parenteral) and why any other interventions?:1}     FINAL CLINICAL IMPRESSION(S) / ED DIAGNOSES   Final diagnoses:  None     Rx / DC Orders   ED Discharge Orders     None        Note:  This document was prepared using Dragon voice recognition software and may include unintentional dictation errors.

## 2022-07-12 NOTE — ED Triage Notes (Signed)
Pt c/o shortness of breath, wakes him from sleeping. Hx of sleep apnea, but reports this is different. Feels exertional dyspnea frequently. Chest " uncomfortableness" sometimes.

## 2022-07-13 MED ORDER — ALBUTEROL SULFATE HFA 108 (90 BASE) MCG/ACT IN AERS: 2.0000 | INHALATION_SPRAY | Freq: Four times a day (QID) | RESPIRATORY_TRACT | 2 refills | Status: AC | PRN

## 2022-07-13 MED ORDER — ATORVASTATIN CALCIUM 20 MG PO TABS: 20.0000 mg | ORAL_TABLET | Freq: Every day | ORAL | 0 refills | Status: DC

## 2022-07-13 MED ORDER — LISINOPRIL-HYDROCHLOROTHIAZIDE 20-25 MG PO TABS: 1.0000 | ORAL_TABLET | Freq: Every day | ORAL | 0 refills | Status: DC

## 2022-07-13 MED ORDER — AMLODIPINE BESYLATE 10 MG PO TABS: 10.0000 mg | ORAL_TABLET | Freq: Every day | ORAL | 0 refills | Status: DC

## 2022-07-13 NOTE — Discharge Instructions (Signed)
Please seek medical attention for any high fevers, chest pain, shortness of breath, change in behavior, persistent vomiting, bloody stool or any other new or concerning symptoms.  

## 2022-07-15 NOTE — Progress Notes (Deleted)
07/15/22 11:54 AM   William Jimenez [birth year] [id number]  Referring provider:  Center, [facility] [address] [place],  [place] [address]  No chief complaint on file.   Urological history  1. ED -contributing factors of age, diabetes, BPH, HTN and testosterone deficiency -failed PDE5i's inhibitors  -SHIM ***   2. BPH with LU TS -PSA pending -cysto 2020 - mild BPH -I PSS ***   3. Testosterone deficiency -contributing factors of age, diabetes and obesity -testosterone level, 10/2021 - 352    4. Intermediate risk hematuria -non-smoker -work up in 2020 NED -no reports of gross heme -UA ***   HPI: William Jimenez is a 56 y.o.male who presents today for a 6 month follow-up with UA.     Score:  1-7 Mild 8-19 Moderate 20-35 Severe     Score: 1-7 Severe ED 8-11 Moderate ED 12-16 Mild-Moderate ED 17-21 Mild ED 22-25 No ED    PMH: Past Medical History:  Diagnosis Date   HLD (hyperlipidemia)    Hypertension     Surgical History: Past Surgical History:  Procedure Laterality Date   CYSTOSCOPY      Home Medications:  Allergies as of 07/16/2022   No Known Allergies      Medication List        Accurate as of July 15, 2022 11:54 AM. If you have any questions, ask your nurse or doctor.          albuterol 108 (90 Base) MCG/ACT inhaler Commonly known as: VENTOLIN HFA Inhale 2 puffs into the lungs every 6 (six) hours as needed for wheezing or shortness of breath.   alprostadil 1000 MCG pellet Commonly known as: MUSE 1 each (1,000 mcg total) by Transurethral route as needed for erectile dysfunction. use no more than 3 times per week   amLODipine 10 MG tablet Commonly known as: NORVASC Take 1 tablet (10 mg total) by mouth daily.   aspirin EC 81 MG tablet Take 81 mg by mouth daily.   atorvastatin 20 MG tablet Commonly known as: LIPITOR Take 1 tablet (20 mg total) by mouth daily.   lisinopril-hydrochlorothiazide 20-25 MG  tablet Commonly known as: ZESTORETIC Take 1 tablet by mouth daily.   meloxicam 15 MG tablet Commonly known as: MOBIC Take 15 mg by mouth daily.   metFORMIN 850 MG tablet Commonly known as: Glucophage Take 1 tablet (850 mg total) by mouth daily with breakfast.   sertraline 50 MG tablet Commonly known as: ZOLOFT Take 1 tablet (50 mg total) by mouth daily.        Allergies:  No Known Allergies  Family History: Family History  Problem Relation Age of Onset   Prostate cancer Neg Hx    Bladder Cancer Neg Hx    Kidney cancer Neg Hx     Social History:  reports that he has never smoked. He uses smokeless tobacco. He reports that he does not drink alcohol and does not use drugs.   Physical Exam: There were no vitals taken for this visit.  Constitutional:  Well nourished. Alert and oriented, No acute distress. HEENT: [place] AT, moist mucus membranes.  Trachea midline Cardiovascular: No clubbing, cyanosis, or edema. Respiratory: Normal respiratory effort, no increased work of breathing. GU: No CVA tenderness.  No bladder fullness or masses.  Patient with circumcised/uncircumcised phallus. ***Foreskin easily retracted***  Urethral meatus is patent.  No penile discharge. No penile lesions or rashes. Scrotum without lesions, cysts, rashes and/or edema.  Testicles are located  scrotally bilaterally. No masses are appreciated in the testicles. Left and right epididymis are normal. Rectal: Patient with  normal sphincter tone. Anus and perineum without scarring or rashes. No rectal masses are appreciated. Prostate is approximately *** grams, *** nodules are appreciated. Seminal vesicles are normal. Neurologic: Grossly intact, no focal deficits, moving all 4 extremities. Psychiatric: Normal mood and affect.   Laboratory Data: Lab Results  Component Value Date   CREATININE 1.48 (H) 07/12/2022   Lab Results  Component Value Date   HGBA1C 6.7 (H) 04/07/2022   Component     Latest Ref Rng  04/07/2022 04/08/2022  Sodium     135 - 145 mmol/L 136  139   Potassium     3.5 - 5.1 mmol/L 3.4 (L)  4.0   Chloride     98 - 111 mmol/L 100  102   CO2     22 - 32 mmol/L 30  32   Glucose     70 - 99 mg/dL 545 (H)  625 (H)   BUN     6 - 20 mg/dL 15  14   Creatinine     0.61 - 1.24 mg/dL 6.38  9.37   Calcium     8.9 - 10.3 mg/dL 8.6 (L)  8.6 (L)   GFR, Estimated     >60 mL/min >60  >60   Anion gap     5 - 15  6  5      Legend: (L) Low (H) High  Urinalysis ***  Pertinent Imaging: ***  Assessment & Plan:    1.  High risk hematuria -Non-smoker -work up x 2 -hypervascular prostate -No reports of gross heme -UA ***  2. BPH with LUTS -PSA pending -DRE benign -UA *** -cysto, 11/2021 -mild lateral lobe enlargement with hypervascularity of the prostate -PVR < 300 cc -symptoms - *** -most bothersome symptoms are *** -continue conservative management, avoiding bladder irritants and timed voiding's -Initiate alpha-blocker (***), discussed side effects *** -Initiate 5 alpha reductase inhibitor (***), discussed side effects *** -Continue tamsulosin 0.4 mg daily, alfuzosin 10 mg daily, Rapaflo 8 mg daily, terazosin, doxazosin, Cialis 5 mg daily and finasteride 5 mg daily, dutasteride 0.5 mg daily***:refills given -Cannot tolerate medication or medication failure, schedule cystoscopy ***  3.  Erectile dysfunction ***       No follow-ups on file.  12/2021, PA-C   [facility] [address] [place], [place] [phone]

## 2022-07-16 ENCOUNTER — Ambulatory Visit: Payer: PRIVATE HEALTH INSURANCE | Admitting: Urology

## 2022-07-16 DIAGNOSIS — N529 Male erectile dysfunction, unspecified: Secondary | ICD-10-CM

## 2022-07-16 DIAGNOSIS — R319 Hematuria, unspecified: Secondary | ICD-10-CM

## 2022-07-16 DIAGNOSIS — N138 Other obstructive and reflux uropathy: Secondary | ICD-10-CM

## 2022-07-17 ENCOUNTER — Encounter: Payer: Self-pay | Admitting: Urology

## 2022-09-01 DIAGNOSIS — N182 Chronic kidney disease, stage 2 (mild): Secondary | ICD-10-CM

## 2022-09-01 DIAGNOSIS — N529 Male erectile dysfunction, unspecified: Secondary | ICD-10-CM | POA: Insufficient documentation

## 2022-09-01 DIAGNOSIS — I10 Essential (primary) hypertension: Secondary | ICD-10-CM | POA: Diagnosis present

## 2022-09-01 DIAGNOSIS — R7303 Prediabetes: Secondary | ICD-10-CM | POA: Insufficient documentation

## 2022-09-01 HISTORY — DX: Prediabetes: R73.03

## 2022-09-01 HISTORY — DX: Chronic kidney disease, stage 2 (mild): N18.2

## 2022-09-02 LAB — HM HEPATITIS C SCREENING LAB: HM Hepatitis Screen: NEGATIVE

## 2022-11-04 ENCOUNTER — Ambulatory Visit: Payer: 59 | Attending: Cardiology | Admitting: Cardiology

## 2022-11-04 ENCOUNTER — Encounter: Payer: Self-pay | Admitting: Cardiology

## 2022-11-04 ENCOUNTER — Telehealth: Payer: Self-pay | Admitting: Cardiology

## 2022-11-04 VITALS — BP 148/112 | HR 87 | Ht 71.0 in | Wt 354.2 lb

## 2022-11-04 DIAGNOSIS — I5020 Unspecified systolic (congestive) heart failure: Secondary | ICD-10-CM

## 2022-11-04 DIAGNOSIS — E7800 Pure hypercholesterolemia, unspecified: Secondary | ICD-10-CM | POA: Diagnosis not present

## 2022-11-04 DIAGNOSIS — I10 Essential (primary) hypertension: Secondary | ICD-10-CM | POA: Diagnosis not present

## 2022-11-04 MED ORDER — SPIRONOLACTONE 25 MG PO TABS: 25.0000 mg | ORAL_TABLET | Freq: Every day | ORAL | 3 refills | Status: DC

## 2022-11-04 MED ORDER — FUROSEMIDE 40 MG PO TABS: 40.0000 mg | ORAL_TABLET | Freq: Two times a day (BID) | ORAL | 3 refills | Status: DC

## 2022-11-04 MED ORDER — CARVEDILOL 12.5 MG PO TABS: 12.5000 mg | ORAL_TABLET | Freq: Two times a day (BID) | ORAL | 3 refills | Status: DC

## 2022-11-04 NOTE — Patient Instructions (Signed)
Medication Instructions:   Your physician has recommended you make the following change in your medication:    STOP taking your Hydrochlorothiazide.  2.    INCREASE your Furosemide (Lasix) to 40 MG twice a day.  3.    START Carvedilol (Coreg) 12.5 MG twice a day.  4.    START taking Spironolactone (Aldactone) 25 MG once a day.   *If you need a refill on your cardiac medications before your next appointment, please call your pharmacy*   Lab Work:  Your physician recommends that you return for lab work (BMP) in: 1 week  - Please go to the [facility]. You will check in at the front desk to the right as you walk into the atrium. Valet Parking is offered if needed. - No appointment needed. You may go any day between 7 am and 6 pm.    Follow-Up: At [facility], you and your health needs are our priority.  As part of our continuing mission to provide you with exceptional heart care, we have created designated Provider Care Teams.  These Care Teams include your primary Cardiologist (physician) and Advanced Practice Providers (APPs -  Physician Assistants and Nurse Practitioners) who all work together to provide you with the care you need, when you need it.  We recommend signing up for the patient portal called "MyChart".  Sign up information is provided on this After Visit Summary.  MyChart is used to connect with patients for Virtual Visits (Telemedicine).  Patients are able to view lab/test results, encounter notes, upcoming appointments, etc.  Non-urgent messages can be sent to your provider as well.   To learn more about what you can do with MyChart, go to [email or web address].    Your next appointment:   4-6 week(s)  The format for your next appointment:   In Person  Provider:   Debbe Odea, MD    Other Instructions    Important Information About Sugar

## 2022-11-04 NOTE — Telephone Encounter (Signed)
LMOV to schedule 4-6 week follow up per checkout 11/04/22

## 2022-11-04 NOTE — Progress Notes (Signed)
Cardiology Office Note:    Date:  11/04/2022   ID:  William Jimenez, DOB Jun 14, 1966, MRN [id number]  PCP:  [name], MD    [facility] Providers Cardiologist:  Debbe Odea, MD     Referring MD: Autumn Messing, MD   Chief Complaint  Patient presents with   New Patient (Initial Visit)    Heart Failure, SOB, L Chest pain, Family Hx    History of Present Illness:    William Jimenez is a 56 y.o. male with a hx of hypertension, hyperlipidemia, OSA who presents with symptoms of shortness of breath.  Patient was evaluated in the ED at [facility] 2 months ago due to shortness of breath and leg edema.  Echocardiogram showed reduced ejection fraction 30 to 35%.  Patient started on Lasix 40 mg daily.  He states his edema has improved, still endorses shortness of breath with exertion.  Feels bloated around his abdomen.  Blood pressure still elevated at home, states blood pressure has been elevated for years now.  Previously placed on Aldactone, but he ran out.  Father had congestive heart failure.  Endorses orthopnea.  Past Medical History:  Diagnosis Date   HLD (hyperlipidemia)    Hypertension     Past Surgical History:  Procedure Laterality Date   CYSTOSCOPY      Current Medications: Current Meds  Medication Sig   acetaminophen (TYLENOL) 500 MG tablet Take 500 mg by mouth.   albuterol (VENTOLIN HFA) 108 (90 Base) MCG/ACT inhaler Inhale 2 puffs into the lungs every 6 (six) hours as needed for wheezing or shortness of breath.   carvedilol (COREG) 12.5 MG tablet Take 1 tablet (12.5 mg total) by mouth 2 (two) times daily.   lisinopril-hydrochlorothiazide (ZESTORETIC) 20-25 MG tablet Take 1 tablet by mouth daily. (Patient taking differently: Take 1 tablet by mouth daily. 40 MG)   pravastatin (PRAVACHOL) 20 MG tablet Take 20 mg by mouth at bedtime.   sertraline (ZOLOFT) 50 MG tablet Take 1 tablet (50 mg total) by mouth daily.   sildenafil (VIAGRA) 100 MG tablet Take 100  mg by mouth.   spironolactone (ALDACTONE) 25 MG tablet Take 1 tablet (25 mg total) by mouth daily.   [DISCONTINUED] furosemide (LASIX) 40 MG tablet Take 40 mg by mouth daily.   [DISCONTINUED] hydrochlorothiazide (HYDRODIURIL) 25 MG tablet Take 25 mg by mouth daily.   Current Facility-Administered Medications for the 11/04/22 encounter (Office Visit) with Debbe Odea, MD  Medication   lidocaine (XYLOCAINE) 2 % jelly 1 application     Allergies:   Patient has no known allergies.   Social History   Socioeconomic History   Marital status: Married    Spouse name: Not on file   Number of children: Not on file   Years of education: Not on file   Highest education level: Not on file  Occupational History   Not on file  Tobacco Use   Smoking status: Never   Smokeless tobacco: Current    Types: Snuff  Substance and Sexual Activity   Alcohol use: No   Drug use: No   Sexual activity: Yes    Birth control/protection: None  Other Topics Concern   Not on file  Social History Narrative   Not on file   Social Determinants of Health   Financial Resource Strain: Not on file  Food Insecurity: Not on file  Transportation Needs: Not on file  Physical Activity: Not on file  Stress: Not on file  Social  Connections: Not on file     Family History: The patient's family history is negative for Prostate cancer, Bladder Cancer, and Kidney cancer.  ROS:   Please see the history of present illness.     All other systems reviewed and are negative.  EKGs/Labs/Other Studies Reviewed:    The following studies were reviewed today:   EKG:  EKG is  ordered today.  The ekg ordered today demonstrates sinus rhythm, PVCs.  Recent Labs: 04/07/2022: B Natriuretic Peptide 143.3; Magnesium 2.0 07/12/2022: ALT 152; BUN 18; Creatinine, Ser 1.48; Hemoglobin 13.0; Platelets 280; Potassium 4.0; Sodium 139  Recent Lipid Panel    Component Value Date/Time   CHOL 212 (H) 04/07/2022 0739   TRIG 56  04/07/2022 0739   HDL 44 04/07/2022 0739   CHOLHDL 4.8 04/07/2022 0739   VLDL 11 04/07/2022 0739   LDLCALC 157 (H) 04/07/2022 0739     Risk Assessment/Calculations:     HYPERTENSION CONTROL Vitals:   11/04/22 1219 11/04/22 1220  BP: (!) 152/112 (!) 148/112    The patient's blood pressure is elevated above target today.  In order to address the patient's elevated BP: A new medication was prescribed today.            Physical Exam:    VS:  BP (!) 148/112 (BP Location: Right Arm)   Pulse 87   Ht 5\' 11"  (1.803 m)   Wt (!) 354 lb 3.2 oz (160.7 kg)   SpO2 95%   BMI 49.40 kg/m     Wt Readings from Last 3 Encounters:  11/04/22 (!) 354 lb 3.2 oz (160.7 kg)  07/12/22 (!) 361 lb (163.7 kg)  04/08/22 298 lb 1 oz (135.2 kg)     GEN:  Well nourished, well developed in no acute distress HEENT: Normal NECK: No JVD; No carotid bruits LYMPHATICS: No lymphadenopathy CARDIAC: RRR, no murmurs, rubs, gallops RESPIRATORY: Diminished breath sounds at bases, no wheezing ABDOMEN: Soft, non-tender, abdominal distention MUSCULOSKELETAL:  No edema; No deformity  SKIN: Warm and dry NEUROLOGIC:  Alert and oriented x 3 PSYCHIATRIC:  Normal affect   ASSESSMENT:    1. HFrEF (heart failure with reduced ejection fraction) (HCC)   2. Primary hypertension   3. Pure hypercholesterolemia    PLAN:    In order of problems listed above:  HFrEF EF 30 to 35%.  Abdominal distention.  Start Coreg 12.5 mg twice daily, start Aldactone 25 mg daily.  Continue lisinopril.  Describes NYHA class III symptoms.  Increase Lasix to 40 mg twice daily.  Check BMP in 1 week.  Plan ischemic work-up/left heart catheter at follow-up visit.  04/10/22 was too expensive for patient.  Etiology could possibly be uncontrolled hypertension. Hypertension BP elevated.  Start Coreg, Aldactone, lisinopril as above. Hyperlipidemia, continue Pravachol.  Follow-up in 4 to 6 weeks for medication titration and possibly schedule  left heart cath.      Medication Adjustments/Labs and Tests Ordered: Current medicines are reviewed at length with the patient today.  Concerns regarding medicines are outlined above.  Orders Placed This Encounter  Procedures   Basic metabolic panel   EKG 12-Lead   Meds ordered this encounter  Medications   carvedilol (COREG) 12.5 MG tablet    Sig: Take 1 tablet (12.5 mg total) by mouth 2 (two) times daily.    Dispense:  60 tablet    Refill:  3   spironolactone (ALDACTONE) 25 MG tablet    Sig: Take 1 tablet (25 mg total) by mouth  daily.    Dispense:  30 tablet    Refill:  3   furosemide (LASIX) 40 MG tablet    Sig: Take 1 tablet (40 mg total) by mouth 2 (two) times daily.    Dispense:  60 tablet    Refill:  3    Patient Instructions  Medication Instructions:   Your physician has recommended you make the following change in your medication:    STOP taking your Hydrochlorothiazide.  2.    INCREASE your Furosemide (Lasix) to 40 MG twice a day.  3.    START Carvedilol (Coreg) 12.5 MG twice a day.  4.    START taking Spironolactone (Aldactone) 25 MG once a day.   *If you need a refill on your cardiac medications before your next appointment, please call your pharmacy*   Lab Work:  Your physician recommends that you return for lab work (BMP) in: 1 week  - Please go to the [facility]. You will check in at the front desk to the right as you walk into the atrium. Valet Parking is offered if needed. - No appointment needed. You may go any day between 7 am and 6 pm.    Follow-Up: At [facility], you and your health needs are our priority.  As part of our continuing mission to provide you with exceptional heart care, we have created designated Provider Care Teams.  These Care Teams include your primary Cardiologist (physician) and Advanced Practice Providers (APPs -  Physician Assistants and Nurse Practitioners) who all work together to provide you with the  care you need, when you need it.  We recommend signing up for the patient portal called "MyChart".  Sign up information is provided on this After Visit Summary.  MyChart is used to connect with patients for Virtual Visits (Telemedicine).  Patients are able to view lab/test results, encounter notes, upcoming appointments, etc.  Non-urgent messages can be sent to your provider as well.   To learn more about what you can do with MyChart, go to [email or web address].    Your next appointment:   4-6 week(s)  The format for your next appointment:   In Person  Provider:   Debbe Odea, MD    Other Instructions    Important Information About Sugar         Signed, Debbe Odea, MD  11/04/2022 1:11 PM    [facility]

## 2022-11-08 IMAGING — CR DG CHEST 2V
2 series · 2 of 2 positions shown · non-contrast
Comparison: No priors.

CLINICAL DATA: 55-year-old male with history of shortness of
breath.

EXAM:
CHEST - 2 VIEW

[chest pa]
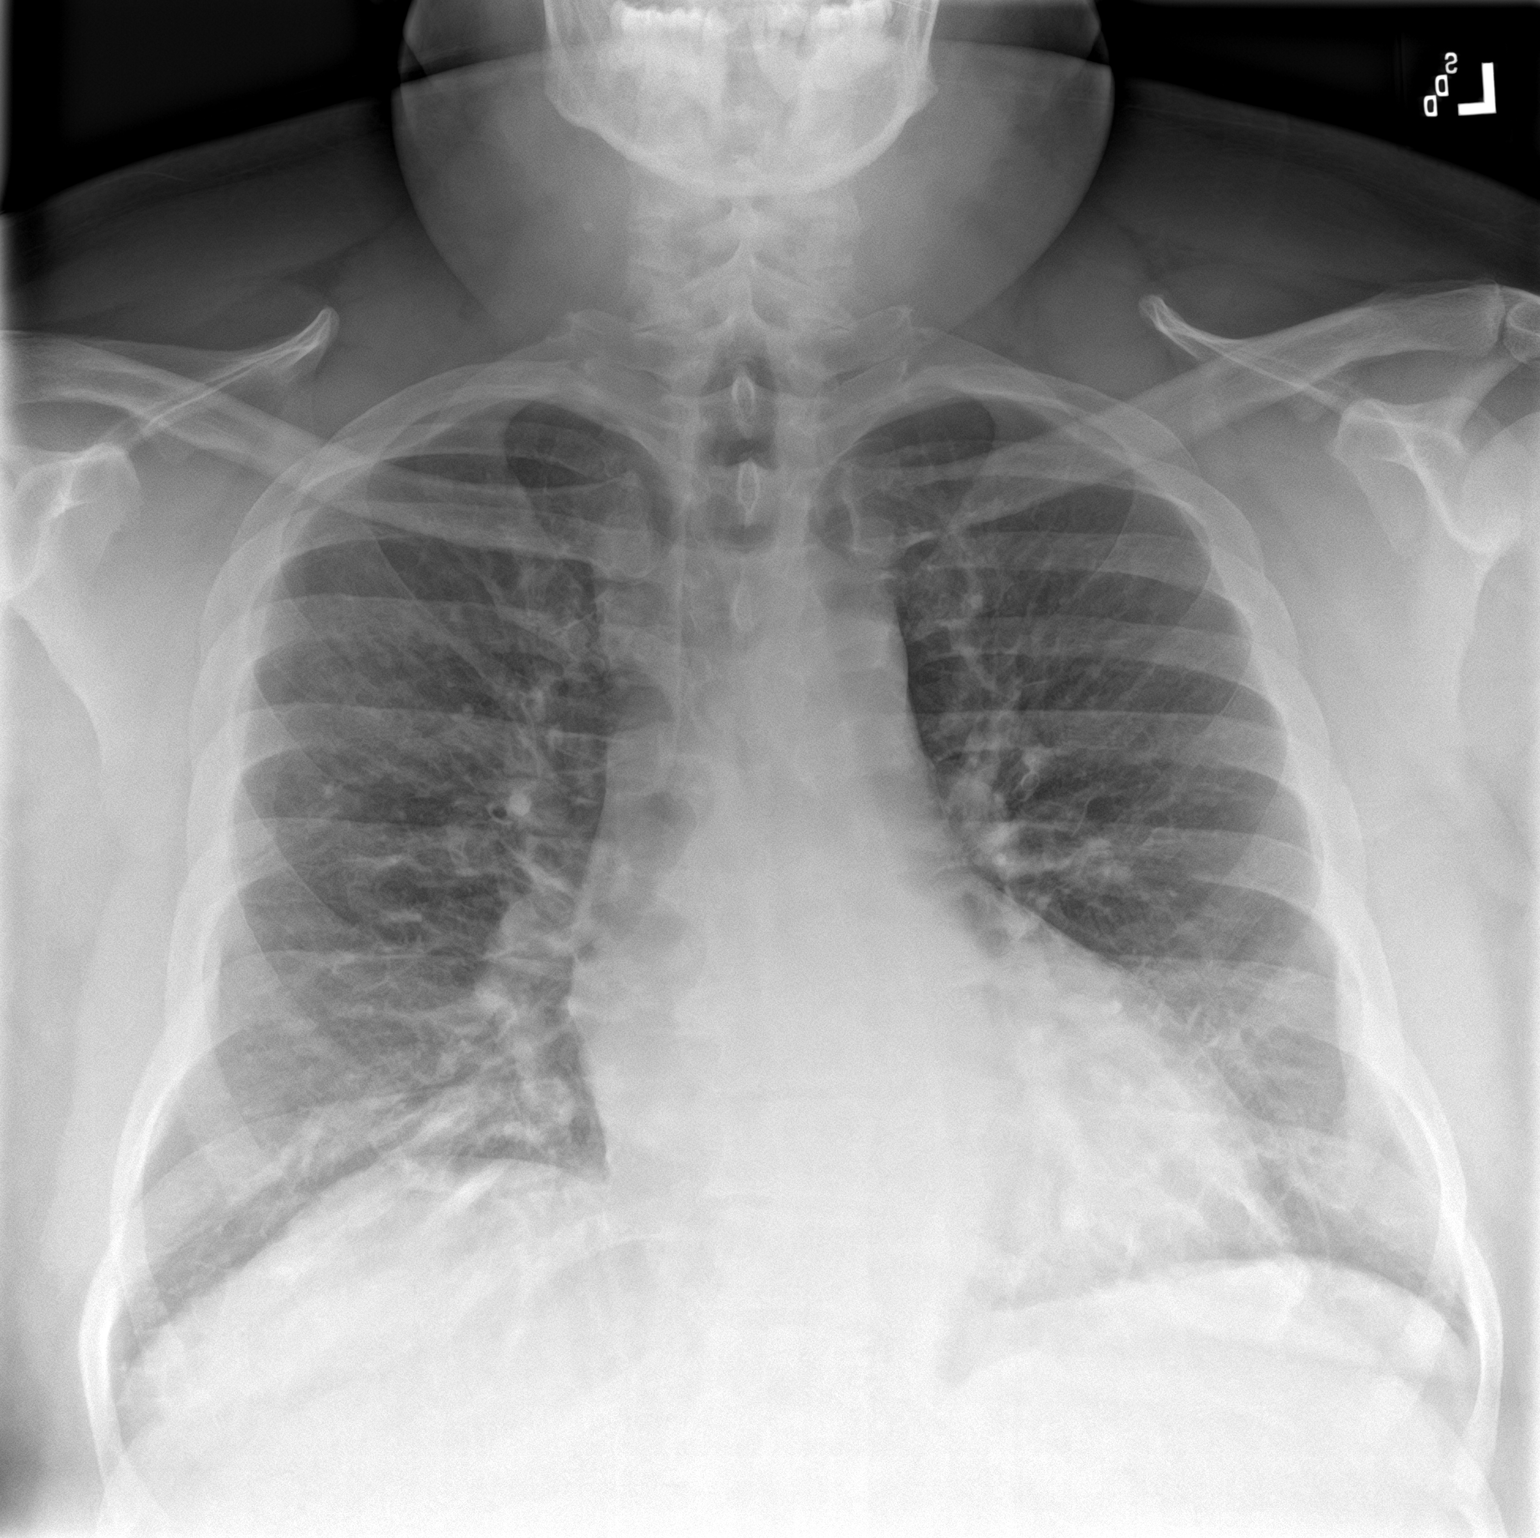

[chest lat]
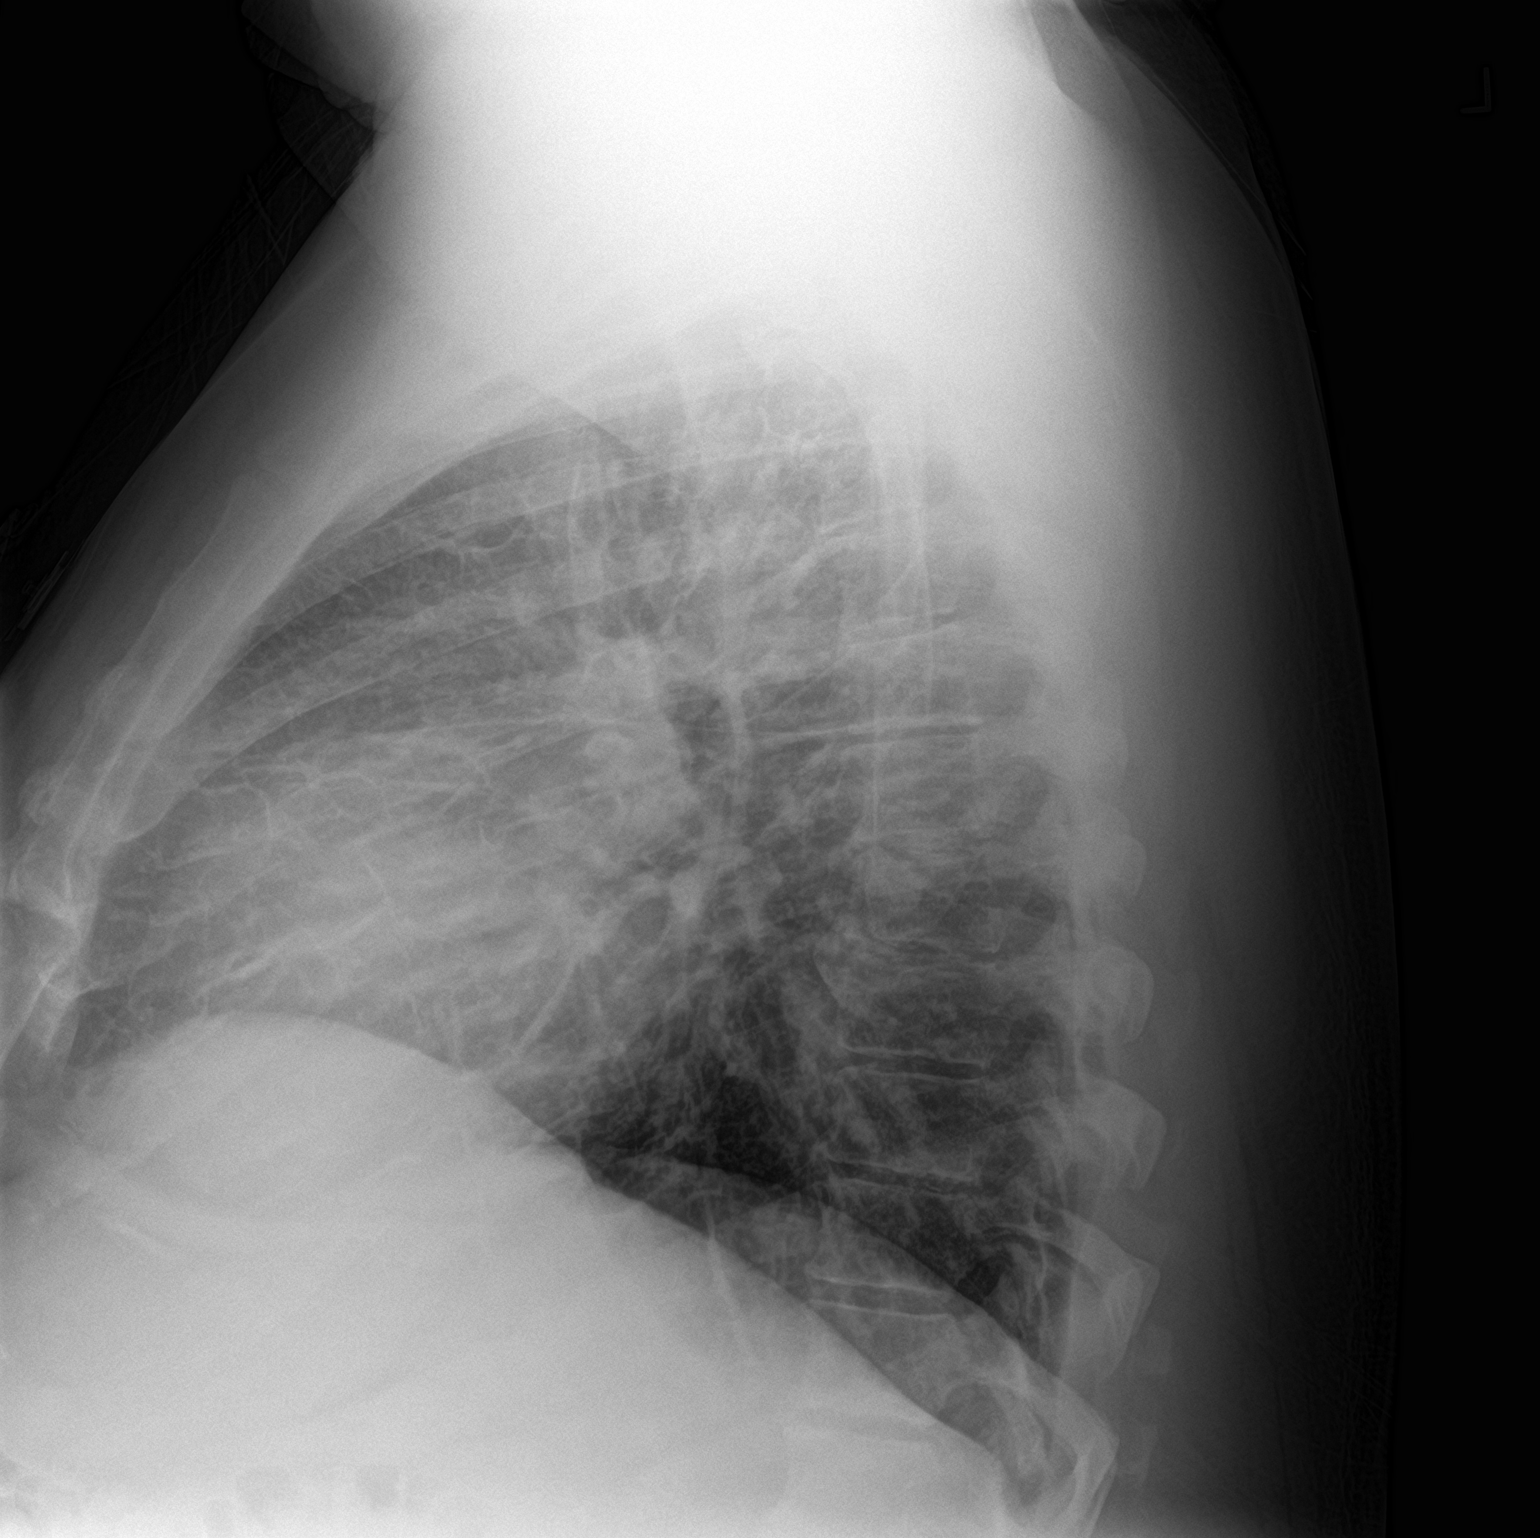

[2 of 2 positions shown; findings below may reference images not displayed]

FINDINGS: Lung volumes are normal. Scattered areas of interstitial prominence
and peribronchial cuffing are noted throughout the mid to lower
lungs bilaterally. No consolidative airspace disease. No pleural
effusions. No pneumothorax. No pulmonary nodule or mass noted.
Pulmonary vasculature and the cardiomediastinal silhouette are
within normal limits.
IMPRESSION: 1. Findings are concerning for an acute bronchitis, as above.

## 2022-12-11 NOTE — Progress Notes (Signed)
Cardiology Clinic Note   Patient Name: BALIAN SCHALLER Date of Encounter: 12/13/2022  Primary Care Provider:  Marguerita Merles, MD Primary Cardiologist:  Kate Sable, MD  Patient Profile    BARTOSZ LUGINBILL is a 56 y.o. male with a past medical history of HFrEF, hypertension, hyperlipidemia, OSA, and obesity who presents to the clinic today for follow-up.   Past Medical History    Past Medical History:  Diagnosis Date   HLD (hyperlipidemia)    Hypertension    Past Surgical History:  Procedure Laterality Date   CYSTOSCOPY      Allergies  No Known Allergies  History of Present Illness    DERRYL UHER has a past medical history of: HFrEF. Echo 09/02/2022 at outside facility: EF 30-35%. Left ventricle mildly dilated. Right ventricle mildly dilated. Mild to moderate pulmonary hypertension.  Hypertension.  Hyperlipidemia.  Lipid panel 04/07/2022: LDL 157, HDL 44, TG 56, total 212. OSA. Obesity.   Mr. Mah was first evaluated by Dr. Garen Lah on 11/04/2022 for shortness of breath. Two months prior he was seen in [facility] [address] ED for shortness of breath and lower extremity edema. His echo showed EF 30-35%. He was started on Lasix 40 mg daily and extremity edema improved. He complained of abdominal bloating, continued shortness of breath, orthopnea and hypertension. Patient was started on Coreg 12.5 bid, aldactone 25 mg daily and Lasix was increased to 40 mg bid. Plan for ischemic workup/schedule LHC at follow-up visit.   Today, patient is accompanied by his wife. He reports continued shortness of breath with walking. He is no longer able to walk his dog secondary to needing multiple rest breaks. He reports lower extremity edema and abdominal bloating resolved. He has brisk diuresis with lasix. He reports occasional left sided chest pain that feels like burning. There is no pattern to it. It can come on with or without exertion. He also reports occasional palpitations not related to  shortness of breath or chest pain. Patient and wife report BP is better controled at home. It is still typically around 130/80s with some readings in the 170s  but patient believes the higher readings were taken before he took medication. Discussed heart catheterization and answered all questions.     Home Medications    Current Meds  Medication Sig   acetaminophen (TYLENOL) 500 MG tablet Take 500 mg by mouth.   albuterol (VENTOLIN HFA) 108 (90 Base) MCG/ACT inhaler Inhale 2 puffs into the lungs every 6 (six) hours as needed for wheezing or shortness of breath.   carvedilol (COREG) 12.5 MG tablet Take 1 tablet (12.5 mg total) by mouth 2 (two) times daily.   furosemide (LASIX) 40 MG tablet Take 1 tablet (40 mg total) by mouth 2 (two) times daily.   pravastatin (PRAVACHOL) 20 MG tablet Take 20 mg by mouth at bedtime.   sertraline (ZOLOFT) 50 MG tablet Take 1 tablet (50 mg total) by mouth daily.   sildenafil (VIAGRA) 100 MG tablet Take 100 mg by mouth.   spironolactone (ALDACTONE) 25 MG tablet Take 1 tablet (25 mg total) by mouth daily.    Family History    Family History  Problem Relation Age of Onset   Prostate cancer Neg Hx    Bladder Cancer Neg Hx    Kidney cancer Neg Hx    He indicated that his mother is deceased. He indicated that his father is deceased. He indicated that the status of his neg hx is unknown.   Social  History    Social History   Socioeconomic History   Marital status: Married    Spouse name: Not on file   Number of children: Not on file   Years of education: Not on file   Highest education level: Not on file  Occupational History   Not on file  Tobacco Use   Smoking status: Never   Smokeless tobacco: Current    Types: Snuff  Vaping Use   Vaping Use: Never used  Substance and Sexual Activity   Alcohol use: No   Drug use: No   Sexual activity: Yes    Birth control/protection: None  Other Topics Concern   Not on file  Social History Narrative    Not on file   Social Determinants of Health   Financial Resource Strain: Not on file  Food Insecurity: Not on file  Transportation Needs: Not on file  Physical Activity: Not on file  Stress: Not on file  Social Connections: Not on file  Intimate Partner Violence: Not on file     Review of Systems    General:  No chills, fever, night sweats or weight changes.  Cardiovascular:  Mild occasional chest pain. Continued dyspnea on exertion. No edema, orthopnea,  paroxysmal nocturnal dyspnea. Occasional palpitations.  Dermatological: No rash, lesions/masses Respiratory: No cough, dyspnea at rest Urologic: No hematuria, dysuria Abdominal:   No nausea, vomiting, diarrhea, bright red blood per rectum, melena, or hematemesis Neurologic:  No visual changes, weakness, changes in mental status. All other systems reviewed and are otherwise negative except as noted above.  Physical Exam    VS:  BP (!) 134/90 (BP Location: Left Arm, Patient Position: Sitting, Cuff Size: Large)   Pulse 70   Ht _0  (1.803 m)   Wt (!) 348 lb (157.9 kg)   SpO2 98%   BMI 48.54 kg/m  , BMI Body mass index is 48.54 kg/m. GEN:  Well nourished, well developed, in no acute distress. HEENT: Normal. Neck: Supple, no JVD, carotid bruits, or masses. Cardiac: RRR, no murmurs, rubs, or gallops. No clubbing, cyanosis, edema.  Radials/DP/PT 2+ and equal bilaterally.  Respiratory:  Respirations regular and unlabored, clear to auscultation bilaterally. GI: Soft, nontender, nondistended. MS: No deformity or atrophy. Skin: Warm and dry, no rash. Neuro: Strength and sensation are intact. Psych: Normal affect.  Accessory Clinical Findings    Recent Labs: BMP from outside facility 12/03/2022: Glucose 97, BUN 19, CRT 1.18, eGFR 73, sodium 137, potassium 4.2, calcium 9.2.   Recent Lipid Panel    Component Value Date/Time   CHOL 212 (H) 04/07/2022 0739   TRIG 56 04/07/2022 0739   HDL 44 04/07/2022 0739   CHOLHDL 4.8  04/07/2022 0739   VLDL 11 04/07/2022 0739   LDLCALC 157 (H) 04/07/2022 0739      ECG is not indicated today.     Assessment & Plan   HFrEF. Echo in September 2023 at outside facility EF 30-35%. Mild to moderate pulmonary hypertension. Patient was evaluated by Dr. Garen Lah in November 2023 and started on Coreg, aldactone, and lasix was increased. Patient had repeat BMP on 12/03/2022 at [facility] (will scan into chart). Patient continues to have exertional shortness of breath with just short walks (sometimes occurs with only 10-12 steps) and requiring multiple rest breaks. He also has occasional left sided chest pain described as burning with no pattern. He reports no lower extremity edema or abdominal bloating. He has brisk diuresis with lasix. Will get patient scheduled  for LHC. Continue lisinopril, carvedilol, lasix and spironolactone. Continue to escalate GDMT and further recommendation pending LHC.   Shared Decision Making/Informed Consent The risks [stroke (1 in 1000), death (1 in 1000), kidney failure [usually temporary] (1 in 500), bleeding (1 in 200), allergic reaction [possibly serious] (1 in 200)], benefits (diagnostic support and management of coronary artery disease) and alternatives of a cardiac catheterization were discussed in detail with Mr. Roy and he is willing to proceed.   Hypertension. BP today 134/90 at intake and 130/82 at recheck. Patient denies headaches or dizziness. Home BP is typically ~130/80s with some readings in the 170s (possibly before taking medications). Will defer titration of medications until after catheterization. At that time, may have patient keep a daily log for two weeks to see how his BP trends. Continue carvedilol and lisinopril.  Hyperlipidemia. LDL April 2023 157, not at goal. Patient was started on pravastatin in September. Will repeat lipid panel and LFTs today.       Disposition: Lipid panel and LFTs today. LHC  scheduled. Return after catheterization.    Justice Britain. Domonik Levario, DNP, NP-C     12/13/2022, 9:30 AM [address] [address] Office [phone] Fax [phone]

## 2022-12-11 NOTE — H&P (View-Only) (Signed)
 Cardiology Clinic Note   Patient Name: William Jimenez Date of Encounter: 12/13/2022  Primary Care Provider:  Miles, Linda M, MD Primary Cardiologist:  Brian Agbor-Etang, MD  Patient Profile    William Jimenez is a 56 y.o. male with a past medical history of HFrEF, hypertension, hyperlipidemia, OSA, and obesity who presents to the clinic today for follow-up.   Past Medical History    Past Medical History:  Diagnosis Date   HLD (hyperlipidemia)    Hypertension    Past Surgical History:  Procedure Laterality Date   CYSTOSCOPY      Allergies  No Known Allergies  History of Present Illness    William Jimenez has a past medical history of: HFrEF. Echo 09/02/2022 at outside facility: EF 30-35%. Left ventricle mildly dilated. Right ventricle mildly dilated. Mild to moderate pulmonary hypertension.  Hypertension.  Hyperlipidemia.  Lipid panel 04/07/2022: LDL 157, HDL 44, TG 56, total 212. OSA. Obesity.   William Jimenez was first evaluated by Dr. Agbor-Etang on 11/04/2022 for shortness of breath. Two months prior he was seen in [facility] ED for shortness of breath and lower extremity edema. His echo showed EF 30-35%. He was started on Lasix 40 mg daily and extremity edema improved. He complained of abdominal bloating, continued shortness of breath, orthopnea and hypertension. Patient was started on Coreg 12.5 bid, aldactone 25 mg daily and Lasix was increased to 40 mg bid. Plan for ischemic workup/schedule LHC at follow-up visit.   Today, patient is accompanied by his wife. He reports continued shortness of breath with walking. He is no longer able to walk his dog secondary to needing multiple rest breaks. He reports lower extremity edema and abdominal bloating resolved. He has brisk diuresis with lasix. He reports occasional left sided chest pain that feels like burning. There is no pattern to it. It can come on with or without exertion. He also reports occasional palpitations not related to  shortness of breath or chest pain. Patient and wife report BP is better controled at home. It is still typically around 130/80s with some readings in the 170s  but patient believes the higher readings were taken before he took medication. Discussed heart catheterization and answered all questions.     Home Medications    Current Meds  Medication Sig   acetaminophen (TYLENOL) 500 MG tablet Take 500 mg by mouth.   albuterol (VENTOLIN HFA) 108 (90 Base) MCG/ACT inhaler Inhale 2 puffs into the lungs every 6 (six) hours as needed for wheezing or shortness of breath.   carvedilol (COREG) 12.5 MG tablet Take 1 tablet (12.5 mg total) by mouth 2 (two) times daily.   furosemide (LASIX) 40 MG tablet Take 1 tablet (40 mg total) by mouth 2 (two) times daily.   pravastatin (PRAVACHOL) 20 MG tablet Take 20 mg by mouth at bedtime.   sertraline (ZOLOFT) 50 MG tablet Take 1 tablet (50 mg total) by mouth daily.   sildenafil (VIAGRA) 100 MG tablet Take 100 mg by mouth.   spironolactone (ALDACTONE) 25 MG tablet Take 1 tablet (25 mg total) by mouth daily.    Family History    Family History  Problem Relation Age of Onset   Prostate cancer Neg Hx    Bladder Cancer Neg Hx    Kidney cancer Neg Hx    He indicated that his mother is deceased. He indicated that his father is deceased. He indicated that the status of his neg hx is unknown.   Social   History    Social History   Socioeconomic History   Marital status: Married    Spouse name: Not on file   Number of children: Not on file   Years of education: Not on file   Highest education level: Not on file  Occupational History   Not on file  Tobacco Use   Smoking status: Never   Smokeless tobacco: Current    Types: Snuff  Vaping Use   Vaping Use: Never used  Substance and Sexual Activity   Alcohol use: No   Drug use: No   Sexual activity: Yes    Birth control/protection: None  Other Topics Concern   Not on file  Social History Narrative    Not on file   Social Determinants of Health   Financial Resource Strain: Not on file  Food Insecurity: Not on file  Transportation Needs: Not on file  Physical Activity: Not on file  Stress: Not on file  Social Connections: Not on file  Intimate Partner Violence: Not on file     Review of Systems    General:  No chills, fever, night sweats or weight changes.  Cardiovascular:  Mild occasional chest pain. Continued dyspnea on exertion. No edema, orthopnea,  paroxysmal nocturnal dyspnea. Occasional palpitations.  Dermatological: No rash, lesions/masses Respiratory: No cough, dyspnea at rest Urologic: No hematuria, dysuria Abdominal:   No nausea, vomiting, diarrhea, bright red blood per rectum, melena, or hematemesis Neurologic:  No visual changes, weakness, changes in mental status. All other systems reviewed and are otherwise negative except as noted above.  Physical Exam    VS:  BP (!) 134/90 (BP Location: Left Arm, Patient Position: Sitting, Cuff Size: Large)   Pulse 70   Ht 5' 11" (1.803 m)   Wt (!) 348 lb (157.9 kg)   SpO2 98%   BMI 48.54 kg/m  , BMI Body mass index is 48.54 kg/m. GEN:  Well nourished, well developed, in no acute distress. HEENT: Normal. Neck: Supple, no JVD, carotid bruits, or masses. Cardiac: RRR, no murmurs, rubs, or gallops. No clubbing, cyanosis, edema.  Radials/DP/PT 2+ and equal bilaterally.  Respiratory:  Respirations regular and unlabored, clear to auscultation bilaterally. GI: Soft, nontender, nondistended. MS: No deformity or atrophy. Skin: Warm and dry, no rash. Neuro: Strength and sensation are intact. Psych: Normal affect.  Accessory Clinical Findings    Recent Labs: BMP from outside facility 12/03/2022: Glucose 97, BUN 19, CRT 1.18, eGFR 73, sodium 137, potassium 4.2, calcium 9.2.   Recent Lipid Panel    Component Value Date/Time   CHOL 212 (H) 04/07/2022 0739   TRIG 56 04/07/2022 0739   HDL 44 04/07/2022 0739   CHOLHDL 4.8  04/07/2022 0739   VLDL 11 04/07/2022 0739   LDLCALC 157 (H) 04/07/2022 0739      ECG is not indicated today.     Assessment & Plan   HFrEF. Echo in September 2023 at outside facility EF 30-35%. Mild to moderate pulmonary hypertension. Patient was evaluated by Dr. Agbor-Etang in November 2023 and started on Coreg, aldactone, and lasix was increased. Patient had repeat BMP on 12/03/2022 at [facility] (will scan into chart). Patient continues to have exertional shortness of breath with just short walks (sometimes occurs with only 10-12 steps) and requiring multiple rest breaks. He also has occasional left sided chest pain described as burning with no pattern. He reports no lower extremity edema or abdominal bloating. He has brisk diuresis with lasix. Will get patient scheduled   for LHC. Continue lisinopril, carvedilol, lasix and spironolactone. Continue to escalate GDMT and further recommendation pending LHC.   Shared Decision Making/Informed Consent The risks [stroke (1 in 1000), death (1 in 1000), kidney failure [usually temporary] (1 in 500), bleeding (1 in 200), allergic reaction [possibly serious] (1 in 200)], benefits (diagnostic support and management of coronary artery disease) and alternatives of a cardiac catheterization were discussed in detail with William Jimenez and he is willing to proceed.   Hypertension. BP today 134/90 at intake and 130/82 at recheck. Patient denies headaches or dizziness. Home BP is typically ~130/80s with some readings in the 170s (possibly before taking medications). Will defer titration of medications until after catheterization. At that time, may have patient keep a daily log for two weeks to see how his BP trends. Continue carvedilol and lisinopril.  Hyperlipidemia. LDL April 2023 157, not at goal. Patient was started on pravastatin in September. Will repeat lipid panel and LFTs today.       Disposition: Lipid panel and LFTs today. LHC  scheduled. Return after catheterization.    William Wickham M. Camdynn Maranto, DNP, NP-C     12/13/2022, 9:30 AM [facility] [address] Office [phone] Fax [phone]  

## 2022-12-13 ENCOUNTER — Ambulatory Visit: Payer: 59 | Attending: Cardiology | Admitting: Student

## 2022-12-13 ENCOUNTER — Other Ambulatory Visit
Admission: RE | Admit: 2022-12-13 | Discharge: 2022-12-13 | Disposition: A | Discharge status: Home / Self Care | Payer: No Typology Code available for payment source | Source: Ambulatory Visit | Attending: Student | Admitting: Student

## 2022-12-13 ENCOUNTER — Encounter: Payer: Self-pay | Admitting: Cardiology

## 2022-12-13 VITALS — BP 130/82 | HR 70 | Ht 71.0 in | Wt 348.0 lb

## 2022-12-13 DIAGNOSIS — E785 Hyperlipidemia, unspecified: Secondary | ICD-10-CM

## 2022-12-13 DIAGNOSIS — I5022 Chronic systolic (congestive) heart failure: Secondary | ICD-10-CM

## 2022-12-13 DIAGNOSIS — I10 Essential (primary) hypertension: Secondary | ICD-10-CM

## 2022-12-13 LAB — BASIC METABOLIC PANEL
Anion gap: 7 (ref 5–15)
BUN: 16 mg/dL (ref 6–20)
CO2: 24 mmol/L (ref 22–32)
Calcium: 9.1 mg/dL (ref 8.9–10.3)
Chloride: 106 mmol/L (ref 98–111)
Creatinine, Ser: 1.17 mg/dL (ref 0.61–1.24)
GFR, Estimated: >60 mL/min (ref >60)
Glucose, Bld: 110 mg/dL — ABNORMAL HIGH (ref 70–99)
Potassium: 4.1 mmol/L (ref 3.5–5.1)
Sodium: 137 mmol/L (ref 135–145)

## 2022-12-13 LAB — CBC
HCT: 45.7 % (ref 39.0–52.0)
Hemoglobin: 14.8 g/dL (ref 13.0–17.0)
MCH: 26.3 pg (ref 26.0–34.0)
MCHC: 32.4 g/dL (ref 30.0–36.0)
MCV: 81.3 fL (ref 80.0–100.0)
Platelets: 283 10*3/uL (ref 150–400)
RBC: 5.62 MIL/uL (ref 4.22–5.81)
RDW: 15.2 % (ref 11.5–15.5)
WBC: 6.6 10*3/uL (ref 4.0–10.5)
nRBC: 0 % (ref 0.0–0.2)

## 2022-12-13 LAB — HEPATIC FUNCTION PANEL
ALT: 34 U/L (ref 0–44)
AST: 30 U/L (ref 15–41)
Albumin: 3.7 g/dL (ref 3.5–5.0)
Alkaline Phosphatase: 53 U/L (ref 38–126)
Bilirubin, Direct: <0.1 mg/dL (ref 0.0–0.2)
Total Bilirubin: 0.9 mg/dL (ref 0.3–1.2)
Total Protein: 6.9 g/dL (ref 6.5–8.1)

## 2022-12-13 LAB — LIPID PANEL
Cholesterol: 235 mg/dL — ABNORMAL HIGH (ref 0–200)
HDL: 55 mg/dL (ref >40)
LDL Cholesterol: 164 mg/dL — ABNORMAL HIGH (ref 0–99)
Total CHOL/HDL Ratio: 4.3 RATIO
Triglycerides: 80 mg/dL (ref <150)
VLDL: 16 mg/dL (ref 0–40)

## 2022-12-13 MED ORDER — SODIUM CHLORIDE 0.9% FLUSH: 3.0000 mL | Freq: Two times a day (BID) | INTRAVENOUS | Status: DC

## 2022-12-13 NOTE — Patient Instructions (Signed)
Medication Instructions:  No changes *If you need a refill on your cardiac medications before your next appointment, please call your pharmacy*   Lab Work: Labs today-Lipid, liver, BMET and CBC If you have labs (blood work) drawn today and your tests are completely normal, you will receive your results only by: MyChart Message (if you have MyChart) OR A paper copy in the mail If you have any lab test that is abnormal or we need to change your treatment, we will call you to review the results.   Testing/Procedures: Your physician has requested that you have a cardiac catheterization. Cardiac catheterization is used to diagnose and/or treat various heart conditions. Doctors may recommend this procedure for a number of different reasons. The most common reason is to evaluate chest pain. Chest pain can be a symptom of coronary artery disease (CAD), and cardiac catheterization can show whether plaque is narrowing or blocking your heart's arteries. This procedure is also used to evaluate the valves, as well as measure the blood flow and oxygen levels in different parts of your heart. For further information please visit [email or web address] Please follow instruction sheet, as given.   Follow-Up: At [facility], you and your health needs are our priority.  As part of our continuing mission to provide you with exceptional heart care, we have created designated Provider Care Teams.  These Care Teams include your primary Cardiologist (physician) and Advanced Practice Providers (APPs -  Physician Assistants and Nurse Practitioners) who all work together to provide you with the care you need, when you need it.  We recommend signing up for the patient portal called "MyChart".  Sign up information is provided on this After Visit Summary.  MyChart is used to connect with patients for Virtual Visits (Telemedicine).  Patients are able to view lab/test results, encounter notes, upcoming appointments,  etc.  Non-urgent messages can be sent to your provider as well.   To learn more about what you can do with MyChart, go to [email or web address].    Your next appointment:   3 week(s)  The format for your next appointment:   In Person  Provider:   You may see Debbe Odea, MD or one of the following Advanced Practice Providers on your designated Care Team:   Nicolasa Ducking, NP Eula Listen, PA-C Cadence Fransico Michael, PA-C Charlsie Quest, NP    Other Instructions   [facility] [address] [place] [address] Dept: [phone] Loc: [phone]   Cardiac/Peripheral Catheterization   You are scheduled for a Cardiac Catheterization on Friday, January 5 with Dr. Cristal [place].  1. Arrive at the [facility] entrance at 10:30 am, one hour prior to your procedure. Free valet service is available.  After entering the [facility] please check-in at the registration desk (1st desk on your right) to receive your armband. After receiving your armband someone will escort you to the cardiac cath/special procedures waiting area. The support person will be asked to wait in the waiting room.  It is OK to have someone drop you off and come back when you are ready to be discharged.        Special note: Every effort is made to have your procedure done on time. Please understand that emergencies sometimes delay scheduled procedures.   . 2. Diet: Do not eat solid foods after midnight.  You may have clear liquids until 5 AM the day of the procedure.  3. Labs: You will need to have blood drawn on 12/13/22  You do not need to be fasting.  4. Medication instructions in preparation for your procedure: Hold the furosemide and spironolactone the morning of the procedure  On the morning of your procedure, take Aspirin 81 mg and any morning medicines NOT listed above.  You may use sips of water.  5. Plan to go home the same day, you will only stay overnight if medically  necessary. 6. You MUST have a responsible adult to drive you home. 7. An adult MUST be with you the first 24 hours after you arrive home. 8. Bring a current list of your medications, and the last time and date medication taken. 9. Bring ID and current insurance cards. 10.Please wear clothes that are easy to get on and off and wear slip-on shoes.  Thank you for allowing [place] to care for you!   -- [facility]

## 2022-12-17 ENCOUNTER — Other Ambulatory Visit: Payer: Self-pay | Admitting: *Deleted

## 2022-12-17 DIAGNOSIS — E7800 Pure hypercholesterolemia, unspecified: Secondary | ICD-10-CM

## 2022-12-17 DIAGNOSIS — E785 Hyperlipidemia, unspecified: Secondary | ICD-10-CM

## 2022-12-17 MED ORDER — ATORVASTATIN CALCIUM 20 MG PO TABS: 20.0000 mg | ORAL_TABLET | Freq: Every day | ORAL | 3 refills | Status: DC

## 2022-12-20 ENCOUNTER — Other Ambulatory Visit: Payer: Self-pay

## 2022-12-20 ENCOUNTER — Telehealth: Payer: Self-pay | Admitting: *Deleted

## 2022-12-20 ENCOUNTER — Ambulatory Visit
Admission: RE | Admit: 2022-12-20 | Discharge: 2022-12-20 | Disposition: A | Discharge status: Home / Self Care | Payer: No Typology Code available for payment source | Source: Ambulatory Visit | Attending: Internal Medicine | Admitting: Internal Medicine

## 2022-12-20 ENCOUNTER — Encounter: Payer: Self-pay | Admitting: Internal Medicine

## 2022-12-20 ENCOUNTER — Encounter
Admission: RE | Disposition: A | Discharge status: Home / Self Care | Payer: Self-pay | Source: Ambulatory Visit | Attending: Internal Medicine

## 2022-12-20 DIAGNOSIS — G4733 Obstructive sleep apnea (adult) (pediatric): Secondary | ICD-10-CM | POA: Insufficient documentation

## 2022-12-20 DIAGNOSIS — F17290 Nicotine dependence, other tobacco product, uncomplicated: Secondary | ICD-10-CM | POA: Insufficient documentation

## 2022-12-20 DIAGNOSIS — I509 Heart failure, unspecified: Secondary | ICD-10-CM

## 2022-12-20 DIAGNOSIS — I2510 Atherosclerotic heart disease of native coronary artery without angina pectoris: Secondary | ICD-10-CM | POA: Diagnosis not present

## 2022-12-20 DIAGNOSIS — R0602 Shortness of breath: Secondary | ICD-10-CM | POA: Diagnosis not present

## 2022-12-20 DIAGNOSIS — E669 Obesity, unspecified: Secondary | ICD-10-CM | POA: Insufficient documentation

## 2022-12-20 DIAGNOSIS — I5022 Chronic systolic (congestive) heart failure: Secondary | ICD-10-CM

## 2022-12-20 DIAGNOSIS — I272 Pulmonary hypertension, unspecified: Secondary | ICD-10-CM | POA: Insufficient documentation

## 2022-12-20 DIAGNOSIS — I110 Hypertensive heart disease with heart failure: Secondary | ICD-10-CM | POA: Insufficient documentation

## 2022-12-20 DIAGNOSIS — E785 Hyperlipidemia, unspecified: Secondary | ICD-10-CM | POA: Insufficient documentation

## 2022-12-20 DIAGNOSIS — Z79899 Other long term (current) drug therapy: Secondary | ICD-10-CM | POA: Diagnosis not present

## 2022-12-20 DIAGNOSIS — Z6841 Body Mass Index (BMI) 40.0 and over, adult: Secondary | ICD-10-CM | POA: Insufficient documentation

## 2022-12-20 DIAGNOSIS — I5020 Unspecified systolic (congestive) heart failure: Secondary | ICD-10-CM

## 2022-12-20 HISTORY — PX: LEFT HEART CATH AND CORONARY ANGIOGRAPHY: CATH118249

## 2022-12-20 SURGERY — LEFT HEART CATH AND CORONARY ANGIOGRAPHY
Anesthesia: Moderate Sedation | Laterality: Left

## 2022-12-20 MED ORDER — SODIUM CHLORIDE 0.9 % IV SOLN: 250.0000 mL | INTRAVENOUS | Status: DC | PRN

## 2022-12-20 MED ORDER — HEPARIN SODIUM (PORCINE) 1000 UNIT/ML IJ SOLN
INTRAMUSCULAR | Status: AC
  Filled 2022-12-20: qty 10

## 2022-12-20 MED ORDER — FUROSEMIDE 10 MG/ML IJ SOLN
INTRAMUSCULAR | Status: AC
  Administered 2022-12-20: 40 mg via INTRAVENOUS
  Filled 2022-12-20: qty 4

## 2022-12-20 MED ORDER — ASPIRIN 81 MG PO CHEW
CHEWABLE_TABLET | ORAL | Status: AC
  Filled 2022-12-20: qty 1

## 2022-12-20 MED ORDER — SODIUM CHLORIDE 0.9 % IV SOLN: INTRAVENOUS | Status: DC

## 2022-12-20 MED ORDER — MIDAZOLAM HCL 2 MG/2ML IJ SOLN
INTRAMUSCULAR | Status: DC | PRN
  Administered 2022-12-20: 1 mg via INTRAVENOUS

## 2022-12-20 MED ORDER — FENTANYL CITRATE (PF) 100 MCG/2ML IJ SOLN
INTRAMUSCULAR | Status: DC | PRN
  Administered 2022-12-20: 25 ug via INTRAVENOUS

## 2022-12-20 MED ORDER — MIDAZOLAM HCL 2 MG/2ML IJ SOLN
INTRAMUSCULAR | Status: AC
  Filled 2022-12-20: qty 2

## 2022-12-20 MED ORDER — LABETALOL HCL 5 MG/ML IV SOLN
10.0000 mg | INTRAVENOUS | Status: DC | PRN
  Administered 2022-12-20: 10 mg via INTRAVENOUS

## 2022-12-20 MED ORDER — SODIUM CHLORIDE 0.9% FLUSH: 3.0000 mL | INTRAVENOUS | Status: DC | PRN

## 2022-12-20 MED ORDER — FUROSEMIDE 10 MG/ML IJ SOLN: 40.0000 mg | Freq: Once | INTRAMUSCULAR | Status: AC

## 2022-12-20 MED ORDER — FENTANYL CITRATE (PF) 100 MCG/2ML IJ SOLN
INTRAMUSCULAR | Status: AC
  Filled 2022-12-20: qty 2

## 2022-12-20 MED ORDER — ONDANSETRON HCL 4 MG/2ML IJ SOLN: 4.0000 mg | Freq: Four times a day (QID) | INTRAMUSCULAR | Status: DC | PRN

## 2022-12-20 MED ORDER — HEPARIN (PORCINE) IN NACL 1000-0.9 UT/500ML-% IV SOLN
INTRAVENOUS | Status: AC
  Filled 2022-12-20: qty 1000

## 2022-12-20 MED ORDER — FUROSEMIDE 80 MG PO TABS: 80.0000 mg | ORAL_TABLET | Freq: Two times a day (BID) | ORAL | 5 refills | Status: DC

## 2022-12-20 MED ORDER — LISINOPRIL 20 MG PO TABS: 20.0000 mg | ORAL_TABLET | Freq: Every day | ORAL | 5 refills | Status: DC

## 2022-12-20 MED ORDER — HYDRALAZINE HCL 20 MG/ML IJ SOLN: 10.0000 mg | INTRAMUSCULAR | Status: DC | PRN

## 2022-12-20 MED ORDER — VERAPAMIL HCL 2.5 MG/ML IV SOLN
INTRAVENOUS | Status: AC
  Filled 2022-12-20: qty 2

## 2022-12-20 MED ORDER — ASPIRIN 81 MG PO CHEW
81.0000 mg | CHEWABLE_TABLET | ORAL | Status: AC
  Administered 2022-12-20: 81 mg via ORAL

## 2022-12-20 MED ORDER — ACETAMINOPHEN 325 MG PO TABS: 650.0000 mg | ORAL_TABLET | ORAL | Status: DC | PRN

## 2022-12-20 MED ORDER — HEPARIN SODIUM (PORCINE) 1000 UNIT/ML IJ SOLN
INTRAMUSCULAR | Status: DC | PRN
  Administered 2022-12-20: 5000 [IU] via INTRAVENOUS

## 2022-12-20 MED ORDER — SODIUM CHLORIDE 0.9% FLUSH: 3.0000 mL | Freq: Two times a day (BID) | INTRAVENOUS | Status: DC

## 2022-12-20 MED ORDER — HYDRALAZINE HCL 20 MG/ML IJ SOLN
INTRAMUSCULAR | Status: AC
  Filled 2022-12-20: qty 1

## 2022-12-20 MED ORDER — HEPARIN (PORCINE) IN NACL 1000-0.9 UT/500ML-% IV SOLN
INTRAVENOUS | Status: DC | PRN
  Administered 2022-12-20: 1000 mL

## 2022-12-20 MED ORDER — LABETALOL HCL 5 MG/ML IV SOLN
INTRAVENOUS | Status: AC
  Filled 2022-12-20: qty 4

## 2022-12-20 MED ORDER — VERAPAMIL HCL 2.5 MG/ML IV SOLN
INTRAVENOUS | Status: DC | PRN
  Administered 2022-12-20: 2.5 mg via INTRA_ARTERIAL

## 2022-12-20 MED ORDER — IOHEXOL 300 MG/ML  SOLN
INTRAMUSCULAR | Status: DC | PRN
  Administered 2022-12-20: 42 mL

## 2022-12-20 SURGICAL SUPPLY — 10 items
BAND ZEPHYR COMPRESS 30 LONG (HEMOSTASIS) IMPLANT
CATH 5F 110X4 TIG (CATHETERS) IMPLANT
DRAPE BRACHIAL (DRAPES) IMPLANT
GLIDESHEATH SLEND SS 6F .021 (SHEATH) IMPLANT
GUIDEWIRE INQWIRE 1.5J.035X260 (WIRE) IMPLANT
INQWIRE 1.5J .035X260CM (WIRE) ×1
PACK CARDIAC CATH (CUSTOM PROCEDURE TRAY) ×1 IMPLANT
PROTECTION STATION PRESSURIZED (MISCELLANEOUS) ×1
SET ATX SIMPLICITY (MISCELLANEOUS) IMPLANT
STATION PROTECTION PRESSURIZED (MISCELLANEOUS) IMPLANT

## 2022-12-20 NOTE — Telephone Encounter (Signed)
-----   Message from Nelva Bush, MD sent at 12/20/2022  1:45 PM EST ----- Regarding: Post-cath BMP Hello,  Could you have Mr. Bartoszek get a BMP in 1 week for follow-up of medication changes made post-cath today?  Thanks.  Chris ENd

## 2022-12-20 NOTE — Telephone Encounter (Signed)
The patient is still currently admitted s/p cath. Nursing to follow up on Monday 12/23/21 to discuss repeat lab work next week.

## 2022-12-20 NOTE — Interval H&P Note (Signed)
History and Physical Interval Note:  12/20/2022 12:05 PM  William Jimenez  has presented today for surgery, with the diagnosis of HFrEF.  The various methods of treatment have been discussed with the patient and family. After consideration of risks, benefits and other options for treatment, the patient has consented to  Procedure(s): LEFT HEART CATH AND CORONARY ANGIOGRAPHY (Left) as a surgical intervention.  The patient's history has been reviewed, patient examined, no change in status, stable for surgery.  I have reviewed the patient's chart and labs.  Questions were answered to the patient's satisfaction.    Cath Lab Visit (complete for each Cath Lab visit)  Clinical Evaluation Leading to the Procedure:   ACS: No.  Non-ACS:    Anginal/Heart Failure Classification: NYHA class II  Anti-ischemic medical therapy: Minimal Therapy (1 class of medications)  Non-Invasive Test Results: LVEF 30-35% by echo -> high risk  Prior CABG: No previous CABG  William Jimenez

## 2022-12-23 ENCOUNTER — Encounter: Payer: Self-pay | Admitting: Internal Medicine

## 2022-12-23 NOTE — Telephone Encounter (Signed)
I called the patient and advised him of the message that we received from Dr. Saunders Revel to set him up for a repeat BMP at the end of this week.  The patient advised he was made aware of this at discharge from his cath last week.  He is aware to report to the: [place] at [facility] 1st desk on the right to check in (REGISTRATION)  Lab hours: Monday- Friday (7:30 am- 5:30 pm)  BMP order placed  The patient is aware he does not have to be fasting. He voices understanding of the above and is agreeable.

## 2023-01-03 ENCOUNTER — Ambulatory Visit: Payer: PRIVATE HEALTH INSURANCE | Attending: Cardiology | Admitting: Cardiology

## 2023-01-03 NOTE — Progress Notes (Deleted)
Cardiology Clinic Note   Patient Name: William Jimenez Date of Encounter: 01/03/2023  Primary Care Provider:  Marguerita Merles, MD Primary Cardiologist:  Kate Sable, MD  Patient Profile    57 year old male with past medical history of HFrEF, hypertension, hyperlipidemia, OSA, and obesity who presents to clinic today for follow-up after recent left heart catheterization.  Past Medical History    Past Medical History:  Diagnosis Date   HLD (hyperlipidemia)    Hypertension    Past Surgical History:  Procedure Laterality Date   CYSTOSCOPY     LEFT HEART CATH AND CORONARY ANGIOGRAPHY Left 12/20/2022   Procedure: LEFT HEART CATH AND CORONARY ANGIOGRAPHY;  Surgeon: Nelva Bush, MD;  Location: [place] [facility];  Service: Cardiovascular;  Laterality: Left;    Allergies  No Known Allergies  History of Present Illness    William Ricarte. Jimenez is a 57 year old male with previously mentioned past medical history.  Echocardiogram done at 9.19/23 at outside facility revealed LVEF 30 Hyperlipidemia, lipids, left dilated, right ventricle mildly dilated, mild to moderate pulmonary hypertension.  He was evaluated at [facility] emergency department shortness of breath and lower extremity edema.  Echocardiogram at that time revealed EF 30 to 35%.  He was started on furosemide 40 mg daily and the lower extremity edema improved.  He did complain of abdominal bloating, continued shortness of breath orthopnea hypertension.  He was then started on carvedilol 12.5 mg twice daily and Aldactone 25 mg daily his Lasix was also increased to 40 mg twice daily.  The plan was for ischemic workup/schedule left heart catheterization on follow-up.  He was last seen in clinic 12/13/2022 by D. Wittenborn, NP with continued complaints of shortness of breath with walking, and occasional left-sided chest pain that feels like a burning.  There were no changes to this medication.  Treatment at that time and he was scheduled  for left heart catheterization.  Left heart catheterization completed 12/20/22 revealed mild to moderate single-vessel coronary artery disease with 30 to 40% mid LAD lesion, question focal myocardial bridging.  No significant disease throughout the left circumflex and RCA.  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  Severely elevated heart filling pressures with LVEDP 35-40 mmHg. recommendations were to administer furosemide 40 mg IV x 1 now and increased furosemide to 80 mg p.o. twice daily, lisinopril was increased to 20 mg daily, he was continued continued on current dose of carvedilol spironolactone daily.  He was ordered a BMP in 1 week and to consider outpatient consultation with advanced heart failure team.  He returns to clinic today  Home Medications    Current Outpatient Medications  Medication Sig Dispense Refill   acetaminophen (TYLENOL) 500 MG tablet Take 500 mg by mouth. (Patient not taking: Reported on 12/20/2022)     albuterol (VENTOLIN HFA) 108 (90 Base) MCG/ACT inhaler Inhale 2 puffs into the lungs every 6 (six) hours as needed for wheezing or shortness of breath. 8 g 2   alprostadil (MUSE) 1000 MCG pellet 1 each (1,000 mcg total) by Transurethral route as needed for erectile dysfunction. use no more than 3 times per week (Patient not taking: Reported on 12/13/2022) 6 each 12   atorvastatin (LIPITOR) 20 MG tablet Take 1 tablet (20 mg total) by mouth daily. 90 tablet 3   carvedilol (COREG) 12.5 MG tablet Take 1 tablet (12.5 mg total) by mouth 2 (two) times daily. 60 tablet 3   furosemide (LASIX) 80 MG tablet Take 1 tablet (  80 mg total) by mouth 2 (two) times daily. 60 tablet 5   lisinopril (ZESTRIL) 20 MG tablet Take 1 tablet (20 mg total) by mouth daily. Patient states that he is unsure of the mg. 30 tablet 5   lisinopril (ZESTRIL) 40 MG tablet Take 40 mg by mouth daily.     sertraline (ZOLOFT) 50 MG tablet Take 1 tablet (50 mg total) by mouth daily.     sildenafil  (VIAGRA) 100 MG tablet Take 100 mg by mouth.     spironolactone (ALDACTONE) 25 MG tablet Take 1 tablet (25 mg total) by mouth daily. 30 tablet 3   No current facility-administered medications for this visit.     Family History    Family History  Problem Relation Age of Onset   Prostate cancer Neg Hx    Bladder Cancer Neg Hx    Kidney cancer Neg Hx    He indicated that his mother is deceased. He indicated that his father is deceased. He indicated that the status of his neg hx is unknown.  Social History    Social History   Socioeconomic History   Marital status: Married    Spouse name: Not on file   Number of children: Not on file   Years of education: Not on file   Highest education level: Not on file  Occupational History   Not on file  Tobacco Use   Smoking status: Never   Smokeless tobacco: Current    Types: Snuff  Vaping Use   Vaping Use: Never used  Substance and Sexual Activity   Alcohol use: No   Drug use: No   Sexual activity: Yes    Birth control/protection: None  Other Topics Concern   Not on file  Social History Narrative   Not on file   Social Determinants of Health   Financial Resource Strain: Not on file  Food Insecurity: Not on file  Transportation Needs: Not on file  Physical Activity: Not on file  Stress: Not on file  Social Connections: Not on file  Intimate Partner Violence: Not on file     Review of Systems    General:  No chills, fever, night sweats or weight changes.  Cardiovascular:  No chest pain, dyspnea on exertion, edema, orthopnea, palpitations, paroxysmal nocturnal dyspnea. Dermatological: No rash, lesions/masses Respiratory: No cough, dyspnea Urologic: No hematuria, dysuria Abdominal:   No nausea, vomiting, diarrhea, bright red blood per rectum, melena, or hematemesis Neurologic:  No visual changes, wkns, changes in mental status. All other systems reviewed and are otherwise negative except as noted above.     Physical  Exam    VS:  There were no vitals taken for this visit. , BMI There is no height or weight on file to calculate BMI.     GEN: Well nourished, well developed, in no acute distress. HEENT: normal. Neck: Supple, no JVD, carotid bruits, or masses. Cardiac: RRR, no murmurs, rubs, or gallops. No clubbing, cyanosis, edema.  Radials 2+/PT 2+ and equal bilaterally.  Respiratory:  Respirations regular and unlabored, clear to auscultation bilaterally. GI: Soft, nontender, nondistended, BS + x 4. MS: no deformity or atrophy. Skin: warm and dry, no rash. Neuro:  Strength and sensation are intact. Psych: Normal affect.  Accessory Clinical Findings    ECG personally reviewed by me today- *** - No acute changes  Lab Results  Component Value Date   WBC 6.6 12/13/2022   HGB 14.8 12/13/2022   HCT 45.7 12/13/2022   MCV  81.3 12/13/2022   PLT 283 12/13/2022   Lab Results  Component Value Date   CREATININE 1.17 12/13/2022   BUN 16 12/13/2022   NA 137 12/13/2022   K 4.1 12/13/2022   CL 106 12/13/2022   CO2 24 12/13/2022   Lab Results  Component Value Date   ALT 34 12/13/2022   AST 30 12/13/2022   ALKPHOS 53 12/13/2022   BILITOT 0.9 12/13/2022   Lab Results  Component Value Date   CHOL 235 (H) 12/13/2022   HDL 55 12/13/2022   LDLCALC 164 (H) 12/13/2022   TRIG 80 12/13/2022   CHOLHDL 4.3 12/13/2022    Lab Results  Component Value Date   HGBA1C 6.7 (H) 04/07/2022    Assessment & Plan   1.  ***  Analyssa Downs, NP 01/03/2023, 8:29 AM

## 2023-01-06 ENCOUNTER — Encounter: Payer: Self-pay | Admitting: Cardiology

## 2023-01-07 ENCOUNTER — Encounter: Payer: Self-pay | Admitting: Physician Assistant

## 2023-01-07 NOTE — Progress Notes (Addendum)
Cardiology Office Note    Date:  01/10/2023   ID:  William Jimenez, DOB [birth year], MRN [id number]  PCP:  William Sato, MD  Cardiologist:  Debbe Odea, MD  Electrophysiologist:  None   Chief Complaint: Follow-up  History of Present Illness:   William Jimenez is a 57 y.o. male with history of nonobstructive CAD by LHC in 12/2022, HFrEF secondary to NICM, HTN, HLD, prediabetes, obesity, and OSA who presents for cardiac cath follow-up.  He was admitted to [facility] from 9/17 through 09/03/2022 with acute HFrEF.  BNP 669.  High-sensitivity troponin peaked at 84.  Chest x-ray showed mild pulmonary edema and a small left-sided pleural effusion.  Echo showed an EF of 30 to 35%, mildly dilated LV internal cavity size with increased wall thickness, mildly dilated RV cavity size with normal systolic function, estimated right atrial pressure 5 to 10 mmHg, trivial mitral regurgitation, mild tricuspid regurgitation and mild to moderate pulmonary hypertension with a PASP estimated at 49 mmHg.  He reported symptomatic improvement with 1 dose of IV Lasix.  He was evaluated by cardiology in house with recommendation for outpatient ischemic evaluation following initiation of GDMT.  [name] and Entresto were cost prohibitive.  He was discharged on HCTZ, Lasix, spironolactone, and PTA lisinopril.  Initiation of beta-blocker was recommended in the outpatient setting.  Admission was also notable for transaminitis with ultrasound of the liver showing mild hepatic steatosis and abnormal LFTs suspected to be fatty liver disease versus congestive hepatopathy.  He followed up with Dr. Azucena Cecil as a new patient on 11/04/2022, and reported improvement in his edema though still noted exertional shortness of breath and abdominal distention.  He had run out of spironolactone.  He was started on carvedilol, and reinitiated on spironolactone, with furosemide titrated to 40 mg daily.  He reported a family history of CHF in his  father.  He was last seen in our office on 12/13/2022 and continued to note shortness of breath with ambulation with improvement in lower extremity edema and abdominal bloating.  He continued to note elevated BP readings in the 130s to 170s systolic, though believed the higher readings were taken prior to medications.  LHC on 12/20/2022 (RHC not ordered) showed mild to moderate single-vessel CAD with 30 to 40% mid LAD stenosis with question of focal myocardial bridging.  No significant disease was observed in the LCx or RCA.  LVEDP severely elevated at 35 to 40 mmHg.  Findings were consistent with nonischemic cardiomyopathy.  Following cath, he was given IV Lasix followed by titration of oral furosemide to 80 mg twice daily and titration of lisinopril to 20 mg daily.  Follow-up labs were recommended and remain pending at this time.  He comes in feeling much better today.  He feels like he is back to his baseline.  No angina, dyspnea, palpitations, dizziness, presyncope, or syncope.  No lower extremity swelling.  Stable two-pillow orthopnea.  He has cut back on some of his salt intake.  He is drinking greater than 2 L of water/tea daily.  He is adherent to his medications and believes he is taking lisinopril 80 mg daily rather than 40 mg daily.  He has noted an increase in urine output following titration of furosemide.  By our scale, his weight is up 10 pounds when compared to his visit on 12/13/2022, though up only 4 pounds when compared to his visit in 10/2022.  No radial arteriotomy site complications.  Overall, he is pleased with his  improvement.   Labs independently reviewed: 11/2022 - potassium 4.1, BUN 16, serum creatinine 1.17, Hgb 14.8, PLT 283, TC 235, TG 80, HDL 55, LDL 164, albumin 3.7, AST/ALT normal 08/2022 - magnesium 1.8, A1c 6.4  Past Medical History:  Diagnosis Date   HLD (hyperlipidemia)    Hypertension    NICM (nonischemic cardiomyopathy) ([place])     Past Surgical History:   Procedure Laterality Date   CYSTOSCOPY     LEFT HEART CATH AND CORONARY ANGIOGRAPHY Left 12/20/2022   Procedure: LEFT HEART CATH AND CORONARY ANGIOGRAPHY;  Surgeon: Nelva Bush, MD;  Location: [place] [facility];  Service: Cardiovascular;  Laterality: Left;    Current Medications: Current Meds  Medication Sig   acetaminophen (TYLENOL) 500 MG tablet Take 500 mg by mouth.   albuterol (VENTOLIN HFA) 108 (90 Base) MCG/ACT inhaler Inhale 2 puffs into the lungs every 6 (six) hours as needed for wheezing or shortness of breath.   atorvastatin (LIPITOR) 20 MG tablet Take 1 tablet (20 mg total) by mouth daily.   carvedilol (COREG) 25 MG tablet Take 1 tablet (25 mg total) by mouth 2 (two) times daily with a meal.   furosemide (LASIX) 80 MG tablet Take 1 tablet (80 mg total) by mouth 2 (two) times daily.   lisinopril (ZESTRIL) 40 MG tablet Take 40 mg by mouth daily.   sertraline (ZOLOFT) 50 MG tablet Take 1 tablet (50 mg total) by mouth daily.   sildenafil (VIAGRA) 100 MG tablet Take 100 mg by mouth.   spironolactone (ALDACTONE) 25 MG tablet Take 1 tablet (25 mg total) by mouth daily.   [DISCONTINUED] carvedilol (COREG) 12.5 MG tablet Take 1 tablet (12.5 mg total) by mouth 2 (two) times daily.    Allergies:   Patient has no known allergies.   Social History   Socioeconomic History   Marital status: Married    Spouse name: Not on file   Number of children: Not on file   Years of education: Not on file   Highest education level: Not on file  Occupational History   Not on file  Tobacco Use   Smoking status: Never   Smokeless tobacco: Current    Types: Snuff  Vaping Use   Vaping Use: Never used  Substance and Sexual Activity   Alcohol use: No   Drug use: No   Sexual activity: Yes    Birth control/protection: None  Other Topics Concern   Not on file  Social History Narrative   Not on file   Social Determinants of Health   Financial Resource Strain: Not on file  Food  Insecurity: Not on file  Transportation Needs: Not on file  Physical Activity: Not on file  Stress: Not on file  Social Connections: Not on file     Family History:  The patient's family history is negative for Prostate cancer, Bladder Cancer, and Kidney cancer.  ROS:   12-point review of systems is negative unless otherwise noted in the HPI.   EKGs/Labs/Other Studies Reviewed:    Studies reviewed were summarized above. The additional studies were reviewed today:  LHC 12/20/2022: Conclusions: Mild-moderate single vessel coronary artery disease with 30-40% mid LAD lesion; question focal myocardial bridging.  No significant disease observed in the LCx or RCA.  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 Severely elevated left heart filling pressures (LVEDP 35-40 mmHg).   Recommendations: Administer furosemide 40 mg IV x 1 now and increase standing furosemide to 80 mg PO twice daily. Increase lisinopril to  20 mg daily. Continue current doses of carvedilol and spironolactone. Obtain BMP in 1 week. Consider outpatient consultation with advanced heart failure team. __________  2D echo 09/02/2022 [facility]): Summary   1. Technically difficult study.    2. The left ventricle is mildly dilated in size with mildly increased wall  thickness.   3. The left ventricular systolic function is severely decreased, LVEF is  visually estimated at 30-35%.    4. The right ventricle is mildly dilated in size, with normal systolic  function.   5. IVC size and inspiratory change suggest mildly elevated right atrial  pressure. (5-10 mmHg).    6. There is mild-moderate pulmonary hypertension.     EKG:  EKG is ordered today.  The EKG ordered today demonstrates NSR, 81 bpm, occasional multifocal PVCs trending to a pattern of ventricular bigeminy on rhythm strip, poor R wave progression along the precordial leads, lateral T wave inversion  Recent Labs: 04/07/2022: B Natriuretic Peptide 143.3;  Magnesium 2.0 12/13/2022: ALT 34; BUN 16; Creatinine, Ser 1.17; Hemoglobin 14.8; Platelets 283; Potassium 4.1; Sodium 137  Recent Lipid Panel    Component Value Date/Time   CHOL 235 (H) 12/13/2022 1106   TRIG 80 12/13/2022 1106   HDL 55 12/13/2022 1106   CHOLHDL 4.3 12/13/2022 1106   VLDL 16 12/13/2022 1106   LDLCALC 164 (H) 12/13/2022 1106    PHYSICAL EXAM:    VS:  BP (!) 140/90 (BP Location: Left Arm, Patient Position: Sitting, Cuff Size: Large)   Pulse 81   Ht 5\' 11"  (1.803 m)   Wt (!) 358 lb (162.4 kg)   SpO2 97%   BMI 49.93 kg/m   BMI: Body mass index is 49.93 kg/m.  Physical Exam Vitals reviewed.  Constitutional:      Appearance: He is well-developed.  HENT:     Head: Normocephalic and atraumatic.  Eyes:     General:        Right eye: No discharge.        Left eye: No discharge.  Neck:     Comments: JVD difficult to assess secondary to body habitus. Cardiovascular:     Rate and Rhythm: Normal rate and regular rhythm.     Heart sounds: Normal heart sounds, S1 normal and S2 normal. Heart sounds not distant. No midsystolic click and no opening snap. No murmur heard.    No friction rub.  Pulmonary:     Effort: Pulmonary effort is normal. No respiratory distress.     Breath sounds: Normal breath sounds. No decreased breath sounds, wheezing or rales.  Chest:     Chest wall: No tenderness.  Abdominal:     General: There is no distension.  Musculoskeletal:     Cervical back: Normal range of motion.     Right lower leg: No edema.     Left lower leg: No edema.  Skin:    General: Skin is warm and dry.     Nails: There is no clubbing.  Neurological:     Mental Status: He is alert and oriented to person, place, and time.  Psychiatric:        Speech: Speech normal.        Behavior: Behavior normal.        Thought Content: Thought content normal.        Judgment: Judgment normal.     Wt Readings from Last 3 Encounters:  01/10/23 (!) 358 lb (162.4 kg)  12/20/22  (!) 340 lb (154.2 kg)  12/13/22 (!) 348 lb (157.9 kg)     ASSESSMENT & PLAN:   HFrEF secondary to NICM: Volume status is difficult to assess on physical exam secondary to body habitus, though he notes his breathing is back to baseline.  RHC not ordered, will defer repeat procedure at this time given significant improvement in symptoms, though this may be beneficial moving forward.  He is noted to have frequent PVCs on EKG today, these may be contributing to his cardiomyopathy.  Place 3-day Zio patch to quantify burden.  Titrate carvedilol to 25 mg twice daily with recommendation to take lisinopril 40 mg daily rather than 80 mg daily with continuation of spironolactone 25 mg daily and furosemide 80 mg twice daily.  Entresto and Farxiga/Jardiance noted to be cost prohibitive during [facility] admission in 08/2022.  Check BMP.  CHF education.  Once he has been maintained on maximally tolerated GDMT for several months, pending Zio patch and PVC burden, would look to repeat a limited echo to evaluate for improvement in his cardiomyopathy.  Should this persist, would pursue cardiac MRI with referral to advanced heart failure clinic and EP.  Nonobstructive CAD: No symptoms suggestive of angina or decompensation.  Aggressive risk factor modification and primary prevention including initiation of aspirin and newly started atorvastatin.  Frequent PVCs: Asymptomatic.  Placed on 3-day ZIO XT to quantify burden.  Possibly contributing to his cardiomyopathy.  Titrate carvedilol to 25 mg twice daily.  Check BMP and magnesium with recommendation to replete potassium and magnesium to goal 4.0 and 2.0, respectively.  HTN: Blood pressure remains mildly elevated.  Titrate carvedilol as outlined above with continuation of lisinopril (now at 40 mg daily), spironolactone, and furosemide.  Low-sodium diet is encouraged.  HLD: LDL 164 in 11/2022 with goal being less than 70.  Now on atorvastatin 20 mg as of late 11/2022.  Repeat  fasting lipid panel and LFTs in 4 weeks with recommendation to titrate lipid-lowering therapy as indicated to achieve target LDL.  AKI: Improved on most recent labs.  Check BMP following titration of furosemide and lisinopril at time of cardiac cath.  Transaminitis: Resolved on recent LFT.  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  Will need repeat LFTs in approximately 1 month on atorvastatin.  Obesity with OSA: Weight loss is encouraged throughout healthy diet and regular exercise.  He reports a long history of sleep apnea and just recently started using a CPAP approximately 6 to 7 months ago.  Continued adherence is recommended.    Disposition: F/u with Dr. Azucena Cecil or an APP in 1 month.   Medication Adjustments/Labs and Tests Ordered: Current medicines are reviewed at length with the patient today.  Concerns regarding medicines are outlined above. Medication changes, Labs and Tests ordered today are summarized above and listed in the Patient Instructions accessible in Encounters.   Signed, Eula Listen, PA-C 01/10/2023 8:59 AM     [facility] [place] [address] [place], [place] [address] [phone]

## 2023-01-10 ENCOUNTER — Ambulatory Visit: Payer: PRIVATE HEALTH INSURANCE | Attending: Cardiology | Admitting: Physician Assistant

## 2023-01-10 ENCOUNTER — Encounter: Payer: Self-pay | Admitting: Physician Assistant

## 2023-01-10 ENCOUNTER — Ambulatory Visit (INDEPENDENT_AMBULATORY_CARE_PROVIDER_SITE_OTHER): Payer: PRIVATE HEALTH INSURANCE

## 2023-01-10 ENCOUNTER — Other Ambulatory Visit
Admission: RE | Admit: 2023-01-10 | Discharge: 2023-01-10 | Disposition: A | Discharge status: Home / Self Care | Payer: 59 | Source: Ambulatory Visit | Attending: Cardiology | Admitting: Cardiology

## 2023-01-10 ENCOUNTER — Other Ambulatory Visit: Payer: Self-pay

## 2023-01-10 VITALS — BP 140/90 | HR 81 | Ht 71.0 in | Wt 358.0 lb

## 2023-01-10 DIAGNOSIS — I428 Other cardiomyopathies: Secondary | ICD-10-CM | POA: Diagnosis not present

## 2023-01-10 DIAGNOSIS — I2510 Atherosclerotic heart disease of native coronary artery without angina pectoris: Secondary | ICD-10-CM

## 2023-01-10 DIAGNOSIS — G4733 Obstructive sleep apnea (adult) (pediatric): Secondary | ICD-10-CM

## 2023-01-10 DIAGNOSIS — Z6841 Body Mass Index (BMI) 40.0 and over, adult: Secondary | ICD-10-CM

## 2023-01-10 DIAGNOSIS — I5020 Unspecified systolic (congestive) heart failure: Secondary | ICD-10-CM | POA: Diagnosis present

## 2023-01-10 DIAGNOSIS — R7401 Elevation of levels of liver transaminase levels: Secondary | ICD-10-CM

## 2023-01-10 DIAGNOSIS — E785 Hyperlipidemia, unspecified: Secondary | ICD-10-CM

## 2023-01-10 DIAGNOSIS — Z79899 Other long term (current) drug therapy: Secondary | ICD-10-CM | POA: Diagnosis present

## 2023-01-10 DIAGNOSIS — I493 Ventricular premature depolarization: Secondary | ICD-10-CM

## 2023-01-10 DIAGNOSIS — I10 Essential (primary) hypertension: Secondary | ICD-10-CM

## 2023-01-10 DIAGNOSIS — N179 Acute kidney failure, unspecified: Secondary | ICD-10-CM

## 2023-01-10 DIAGNOSIS — R002 Palpitations: Secondary | ICD-10-CM

## 2023-01-10 LAB — BASIC METABOLIC PANEL
Anion gap: 11 (ref 5–15)
BUN: 21 mg/dL — ABNORMAL HIGH (ref 6–20)
CO2: 22 mmol/L (ref 22–32)
Calcium: 8.8 mg/dL — ABNORMAL LOW (ref 8.9–10.3)
Chloride: 103 mmol/L (ref 98–111)
Creatinine, Ser: 1.24 mg/dL (ref 0.61–1.24)
GFR, Estimated: >60 mL/min (ref >60)
Glucose, Bld: 148 mg/dL — ABNORMAL HIGH (ref 70–99)
Potassium: 4 mmol/L (ref 3.5–5.1)
Sodium: 136 mmol/L (ref 135–145)

## 2023-01-10 LAB — MAGNESIUM: Magnesium: 2 mg/dL (ref 1.7–2.4)

## 2023-01-10 MED ORDER — FUROSEMIDE 40 MG PO TABS: 40.0000 mg | ORAL_TABLET | Freq: Two times a day (BID) | ORAL | 3 refills | Status: DC

## 2023-01-10 MED ORDER — CARVEDILOL 25 MG PO TABS: 25.0000 mg | ORAL_TABLET | Freq: Two times a day (BID) | ORAL | 1 refills | Status: DC

## 2023-01-10 NOTE — Patient Instructions (Signed)
Medication Instructions:  INCREASE carvedilol to 25 mg by mouth twice a day DECREASE lisinopril to 40 mg by mouth daily  *If you need a refill on your cardiac medications before your next appointment, please call your pharmacy*   Lab Work: BMP and magnesium  - Please go to the [facility]. You will check in at the front desk to the right as you walk into the atrium. Valet Parking is offered if needed. - No appointment needed. You may go any day between 7 am and 6 pm.  If you have labs (blood work) drawn today and your tests are completely normal, you will receive your results only by: [place] (if you have MyChart) OR A paper copy in the mail If you have any lab test that is abnormal or we need to change your treatment, we will call you to review the results.   Testing/Procedures: Your physician has recommended that you wear a Zio monitor for 3 days.   This monitor is a medical device that records the heart's electrical activity. Doctors most often use these monitors to diagnose arrhythmias. Arrhythmias are problems with the speed or rhythm of the heartbeat. The monitor is a small device applied to your chest. You can wear one while you do your normal daily activities. While wearing this monitor if you have any symptoms to push the button and record what you felt. Once you have worn this monitor for the period of time provider prescribed (Usually 14 days), you will return the monitor device in the postage paid box. Once it is returned they will download the data collected and provide [place] with a report which the provider will then review and we will call you with those results. Important tips:  Avoid showering during the first 24 hours of wearing the monitor. Avoid excessive sweating to help maximize wear time. Do not submerge the device, no hot tubs, and no swimming pools. Keep any lotions or oils away from the patch. After 24 hours you may shower with the patch on. Take brief  showers with your back facing the shower head.  Do not remove patch once it has been placed because that will interrupt data and decrease adhesive wear time. Push the button when you have any symptoms and write down what you were feeling. Once you have completed wearing your monitor, remove and place into box which has postage paid and place in your outgoing mailbox.  If for some reason you have misplaced your box then call our office and we can provide another box and/or mail it off for you.  Follow-Up: At [facility], you and your health needs are our priority.  As part of our continuing mission to provide you with exceptional heart care, we have created designated Provider Care Teams.  These Care Teams include your primary Cardiologist (physician) and Advanced Practice Providers (APPs -  Physician Assistants and Nurse Practitioners) who all work together to provide you with the care you need, when you need it.  We recommend signing up for the patient portal called "MyChart".  Sign up information is provided on this After Visit Summary.  MyChart is used to connect with patients for Virtual Visits (Telemedicine).  Patients are able to view lab/test results, encounter notes, upcoming appointments, etc.  Non-urgent messages can be sent to your provider as well.   To learn more about what you can do with MyChart, go to [email or web address].    Your next appointment:   1 month(s)  Provider:   You may see Kate Sable, MD or one of the following Advanced Practice Providers on your designated Care Team:   Murray Hodgkins, NP Christell Faith, PA-C Cadence Kathlen Mody, PA-C Gerrie Nordmann, NP

## 2023-01-14 DIAGNOSIS — R002 Palpitations: Secondary | ICD-10-CM

## 2023-02-01 NOTE — Progress Notes (Signed)
Cardiology Office Note    Date:  02/05/2023   ID:  William Jimenez, DOB 1966-08-27, MRN [id number]  PCP:  [name], MD  Cardiologist:  Debbe Odea, MD  Electrophysiologist:  None   Chief Complaint: Follow-up  History of Present Illness:   William Jimenez is a 57 y.o. male with history of nonobstructive CAD by LHC in 12/2022, HFrEF secondary to NICM, frequent PVCs, HTN, HLD, prediabetes, obesity, and OSA who presents for follow-up of his CAD and cardiomyopathy.   He was admitted to [facility] from 9/17 through 09/03/2022 with acute HFrEF.  BNP 669.  High-sensitivity troponin peaked at 84.  Chest x-ray showed mild pulmonary edema and a small left-sided pleural effusion.  Echo showed an EF of 30 to 35%, mildly dilated LV internal cavity size with increased wall thickness, mildly dilated RV cavity size with normal systolic function, estimated right atrial pressure 5 to 10 mmHg, trivial mitral regurgitation, mild tricuspid regurgitation and mild to moderate pulmonary hypertension with a PASP estimated at 49 mmHg.  He reported symptomatic improvement with 1 dose of IV Lasix.  He was evaluated by cardiology in-house with recommendation for outpatient ischemic evaluation following initiation of GDMT.  [name] and Entresto were cost prohibitive.  He was discharged on HCTZ, Lasix, spironolactone, and PTA lisinopril.  Initiation of beta-blocker was recommended in the outpatient setting.  Admission was also notable for transaminitis with ultrasound of the liver showing mild hepatic steatosis and abnormal LFTs suspected to be fatty liver disease versus congestive hepatopathy.   He followed up with Dr. Azucena Cecil as a new patient on 11/04/2022, and reported improvement in his edema though still noted exertional shortness of breath and abdominal distention.  He had run out of spironolactone.  He was started on carvedilol, and reinitiated on spironolactone, with furosemide titrated to 40 mg daily.  He reported  a family history of CHF in his father.  He was seen in our office on 12/13/2022 and continued to note shortness of breath with ambulation with improvement in lower extremity edema and abdominal bloating.  He continued to note elevated BP readings in the 130s to 170s systolic, though believed the higher readings were taken prior to medications.   LHC on 12/20/2022 (RHC not ordered) showed mild to moderate single-vessel CAD with 30 to 40% mid LAD stenosis with question of focal myocardial bridging.  No significant disease was observed in the LCx or RCA.  LVEDP severely elevated at 35 to 40 mmHg.  Findings were consistent with nonischemic cardiomyopathy.  Following cath, he was given IV Lasix followed by titration of oral furosemide to 80 mg twice daily and titration of lisinopril to 20 mg daily.   He was seen in follow-up on 01/10/2023 and felt like he was back to his baseline, without symptoms of angina or cardiac decompensation.  He had stable two-pillow orthopnea.  He had cut back on salt intake.  He was drinking greater than 2 L of liquid daily.  He was adherent to his medications, though did believe he was taking lisinopril 80 mg rather than 40 mg.  He noted increased urinary output following titration of furosemide.  By our scale, his weight was up 10 pounds when compared to his visit in 11/2022, though up only 4 pounds when compared to his visit in 10/2022.  He was pleased with his improvement.  RHC was deferred given significant improvement in symptoms.  He was noted to have frequent PVCs on EKG.  Carvedilol was titrated  to 25 mg twice daily with recommendation to take lisinopril 40 mg rather than 80 mg with continuation of spironolactone and furosemide.  Labs obtained at that time were indicative of uptrending renal function with a BUN of 21 and serum creatinine 1.24 (prior 16/1.17 respectively).  In this setting, it was recommended furosemide be decreased to 40 mg twice daily with a repeat BMP in 1 week  which remains pending at this time.  3-day Zio patch was placed to quantify PVC burden showed a predominant rhythm of sinus with an average rate of 73 bpm with a PVC burden of 6%.  He comes in doing well from a cardiac perspective.  He is without symptoms of angina or decompensation.  No palpitations, presyncope, or syncope.  He does note some brief dizziness if he stands up quickly.  No significant lower extremity swelling or progressive orthopnea.  He is now using his CPAP and is doing well with this.  His weight is down 9 pounds today when compared to his last clinic visit.  He is adherent and tolerating cardiac medications without issues.  He does continue to watch his salt intake, though is drinking a little over 2 L of liquid per day.  Overall, he feels like he is doing very well and does not have any acute cardiac concerns at this time.   Labs independently reviewed: 01/2023 - BUN 16, serum creatinine 1.27, potassium 4.3, albumin 4.2, AST/ALT normal 12/2022 - potassium 4.0, BUN 21, serum creatinine 1.24, magnesium 2.0 11/2022 - Hgb 14.8, PLT 283, TC 235, TG 80, HDL 55, LDL 164 08/2022 - A1c 6.4  Past Medical History:  Diagnosis Date   HLD (hyperlipidemia)    Hypertension    NICM (nonischemic cardiomyopathy) (HCC)     Past Surgical History:  Procedure Laterality Date   CYSTOSCOPY     LEFT HEART CATH AND CORONARY ANGIOGRAPHY Left 12/20/2022   Procedure: LEFT HEART CATH AND CORONARY ANGIOGRAPHY;  Surgeon: Yvonne Kendall, MD;  Location: [facility] INVASIVE CV LAB;  Service: Cardiovascular;  Laterality: Left;    Current Medications: Current Meds  Medication Sig   acetaminophen (TYLENOL) 500 MG tablet Take 500 mg by mouth.   albuterol (VENTOLIN HFA) 108 (90 Base) MCG/ACT inhaler Inhale 2 puffs into the lungs every 6 (six) hours as needed for wheezing or shortness of breath.   atorvastatin (LIPITOR) 20 MG tablet Take 1 tablet (20 mg total) by mouth daily.   furosemide (LASIX) 40 MG tablet Take  1 tablet (40 mg total) by mouth 2 (two) times daily.   lisinopril (ZESTRIL) 40 MG tablet Take 40 mg by mouth daily.   sertraline (ZOLOFT) 50 MG tablet Take 1 tablet (50 mg total) by mouth daily.   sildenafil (VIAGRA) 100 MG tablet Take 100 mg by mouth.   spironolactone (ALDACTONE) 25 MG tablet Take 1 tablet (25 mg total) by mouth daily.   [DISCONTINUED] carvedilol (COREG) 25 MG tablet Take 1 tablet (25 mg total) by mouth 2 (two) times daily with a meal.    Allergies:   Patient has no known allergies.   Social History   Socioeconomic History   Marital status: Married    Spouse name: Not on file   Number of children: Not on file   Years of education: Not on file   Highest education level: Not on file  Occupational History   Not on file  Tobacco Use   Smoking status: Never   Smokeless tobacco: Current    Types: Snuff  Vaping Use   Vaping Use: Never used  Substance and Sexual Activity   Alcohol use: No   Drug use: No   Sexual activity: Yes    Birth control/protection: None  Other Topics Concern   Not on file  Social History Narrative   Not on file   Social Determinants of Health   Financial Resource Strain: Not on file  Food Insecurity: Not on file  Transportation Needs: Not on file  Physical Activity: Not on file  Stress: Not on file  Social Connections: Not on file     Family History:  The patient's family history is negative for Prostate cancer, Bladder Cancer, and Kidney cancer.  ROS:   12-point review of systems is negative unless otherwise noted in HPI.   EKGs/Labs/Other Studies Reviewed:    Studies reviewed were summarized above. The additional studies were reviewed today:  LHC 12/20/2022: Conclusions: Mild-moderate single vessel coronary artery disease with 30-40% mid LAD lesion; question focal myocardial bridging.  No significant disease observed in the LCx or RCA.  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 Severely elevated left heart  filling pressures (LVEDP 35-40 mmHg).   Recommendations: Administer furosemide 40 mg IV x 1 now and increase standing furosemide to 80 mg PO twice daily. Increase lisinopril to 20 mg daily. Continue current doses of carvedilol and spironolactone. Obtain BMP in 1 week. Consider outpatient consultation with advanced heart failure team. __________   2D echo 09/02/2022 [facility]): Summary   1. Technically difficult study.    2. The left ventricle is mildly dilated in size with mildly increased wall  thickness.   3. The left ventricular systolic function is severely decreased, LVEF is  visually estimated at 30-35%.    4. The right ventricle is mildly dilated in size, with normal systolic  function.   5. IVC size and inspiratory change suggest mildly elevated right atrial  pressure. (5-10 mmHg).    6. There is mild-moderate pulmonary hypertension.    EKG:  EKG is not ordered today.   Recent Labs: 04/07/2022: B Natriuretic Peptide 143.3 12/13/2022: ALT 34; Hemoglobin 14.8; Platelets 283 01/10/2023: BUN 21; Creatinine, Ser 1.24; Magnesium 2.0; Potassium 4.0; Sodium 136  Recent Lipid Panel    Component Value Date/Time   CHOL 235 (H) 12/13/2022 1106   TRIG 80 12/13/2022 1106   HDL 55 12/13/2022 1106   CHOLHDL 4.3 12/13/2022 1106   VLDL 16 12/13/2022 1106   LDLCALC 164 (H) 12/13/2022 1106    PHYSICAL EXAM:    VS:  BP (!) 150/76 (BP Location: Left Arm, Patient Position: Sitting, Cuff Size: Large)   Pulse 62   Ht 5\' 10"  (1.778 m)   Wt (!) 349 lb (158.3 kg)   SpO2 98%   BMI 50.08 kg/m   BMI: Body mass index is 50.08 kg/m.  Physical Exam Vitals reviewed.  Constitutional:      Appearance: He is well-developed.  HENT:     Head: Normocephalic and atraumatic.  Eyes:     General:        Right eye: No discharge.        Left eye: No discharge.  Neck:     Comments: JVD difficult to assess secondary to body habitus. Cardiovascular:     Rate and Rhythm: Normal rate and regular rhythm.      Heart sounds: Normal heart sounds, S1 normal and S2 normal. Heart sounds not distant. No midsystolic click and no opening snap. No murmur heard.    No friction rub.  Pulmonary:     Effort: Pulmonary effort is normal. No respiratory distress.     Breath sounds: Normal breath sounds. No decreased breath sounds, wheezing or rales.  Chest:     Chest wall: No tenderness.  Abdominal:     General: There is no distension.  Musculoskeletal:     Cervical back: Normal range of motion.     Right lower leg: No edema.     Left lower leg: No edema.  Skin:    General: Skin is warm and dry.     Nails: There is no clubbing.  Neurological:     Mental Status: He is alert and oriented to person, place, and time.  Psychiatric:        Speech: Speech normal.        Behavior: Behavior normal.        Thought Content: Thought content normal.        Judgment: Judgment normal.     Wt Readings from Last 3 Encounters:  02/05/23 (!) 349 lb (158.3 kg)  01/10/23 (!) 358 lb (162.4 kg)  12/20/22 (!) 340 lb (154.2 kg)     ASSESSMENT & PLAN:   HFrEF secondary to NICM: Volume status is difficult to assess on physical exam secondary to body habitus, though he notes his breathing is back to baseline.  His weight is down 9 pounds when compared to last clinic visit.  He has NYHA class I symptoms.  The addition of Entresto or Farxiga/Jardiance has been limited by financial constraints.  Titrate carvedilol to 37.5 mg twice daily with continuation of lisinopril and spironolactone.  Recent renal function and potassium noted to be stable on lower dose furosemide 40 mg twice daily.  Schedule limited echo to be obtained in 1 month on maximally tolerated GDMT.  If his cardiomyopathy persists at that time, would look to revisit the addition of Entresto and SGLT2 inhibitor along with pursuing cardiac MRI and referral to advanced heart failure clinic and EP.  CHF education.  Nonobstructive CAD: He is without symptoms suggestive  of angina.  Continue aggressive risk factor modification and primary prevention including aspirin and atorvastatin.  Frequent PVCs: Asymptomatic with a 6% PVC burden on recent outpatient cardiac monitoring.  Titrate carvedilol as outlined above.  Recent electrolytes including potassium and magnesium at goal.  HTN: Blood pressure is mildly elevated in the office today.  Titrate carvedilol with continuation of lisinopril and spironolactone as above.  HLD: LDL 164 in 11/2022 with goal being less than 70.  Remains on atorvastatin 20 mg.  Follow-up lipids at next visit.  Transaminitis: Resolved on labs earlier this month.  Obesity with OSA: His weight is down 9 pounds when compared to his last clinic visit.  Continued weight loss is encouraged through heartily diet and regular exercise.  May benefit from GLP-1 agonist.  Continue CPAP adherence.   Disposition: F/u with Dr. Azucena Cecil or an APP after echo.   Medication Adjustments/Labs and Tests Ordered: Current medicines are reviewed at length with the patient today.  Concerns regarding medicines are outlined above. Medication changes, Labs and Tests ordered today are summarized above and listed in the Patient Instructions accessible in Encounters.   Signed, Eula Listen, PA-C 02/05/2023 4:19 PM     [facility] - [place] [address] [place], [place] [address] [phone]

## 2023-02-05 ENCOUNTER — Ambulatory Visit: Payer: PRIVATE HEALTH INSURANCE | Attending: Physician Assistant | Admitting: Physician Assistant

## 2023-02-05 ENCOUNTER — Encounter: Payer: Self-pay | Admitting: Physician Assistant

## 2023-02-05 VITALS — BP 150/76 | HR 62 | Ht 70.0 in | Wt 349.0 lb

## 2023-02-05 DIAGNOSIS — I5020 Unspecified systolic (congestive) heart failure: Secondary | ICD-10-CM

## 2023-02-05 DIAGNOSIS — N179 Acute kidney failure, unspecified: Secondary | ICD-10-CM

## 2023-02-05 DIAGNOSIS — G4733 Obstructive sleep apnea (adult) (pediatric): Secondary | ICD-10-CM

## 2023-02-05 DIAGNOSIS — I428 Other cardiomyopathies: Secondary | ICD-10-CM | POA: Diagnosis not present

## 2023-02-05 DIAGNOSIS — I493 Ventricular premature depolarization: Secondary | ICD-10-CM | POA: Diagnosis not present

## 2023-02-05 DIAGNOSIS — E785 Hyperlipidemia, unspecified: Secondary | ICD-10-CM

## 2023-02-05 DIAGNOSIS — I2510 Atherosclerotic heart disease of native coronary artery without angina pectoris: Secondary | ICD-10-CM

## 2023-02-05 DIAGNOSIS — Z6841 Body Mass Index (BMI) 40.0 and over, adult: Secondary | ICD-10-CM

## 2023-02-05 DIAGNOSIS — I10 Essential (primary) hypertension: Secondary | ICD-10-CM

## 2023-02-05 DIAGNOSIS — R7401 Elevation of levels of liver transaminase levels: Secondary | ICD-10-CM

## 2023-02-05 MED ORDER — CARVEDILOL 25 MG PO TABS: 37.5000 mg | ORAL_TABLET | Freq: Two times a day (BID) | ORAL | 11 refills | Status: DC

## 2023-02-05 NOTE — Patient Instructions (Signed)
Medication Instructions:  Your physician has recommended you make the following change in your medication:   INCREASE Carvedilol to 37.5 mg twice a day  *If you need a refill on your cardiac medications before your next appointment, please call your pharmacy*   Lab Work: None  If you have labs (blood work) drawn today and your tests are completely normal, you will receive your results only by: [place] (if you have MyChart) OR A paper copy in the mail If you have any lab test that is abnormal or we need to change your treatment, we will call you to review the results.   Testing/Procedures: Your physician has requested that you have an echocardiogram. Echocardiography is a painless test that uses sound waves to create images of your heart. It provides your doctor with information about the size and shape of your heart and how well your heart's chambers and valves are working. This procedure takes approximately one hour. There are no restrictions for this procedure. Please do NOT wear cologne, perfume, aftershave, or lotions (deodorant is allowed). Please arrive 15 minutes prior to your appointment time.    Follow-Up: At [facility], you and your health needs are our priority.  As part of our continuing mission to provide you with exceptional heart care, we have created designated Provider Care Teams.  These Care Teams include your primary Cardiologist (physician) and Advanced Practice Providers (APPs -  Physician Assistants and Nurse Practitioners) who all work together to provide you with the care you need, when you need it.  We recommend signing up for the patient portal called "MyChart".  Sign up information is provided on this After Visit Summary.  MyChart is used to connect with patients for Virtual Visits (Telemedicine).  Patients are able to view lab/test results, encounter notes, upcoming appointments, etc.  Non-urgent messages can be sent to your provider as well.    To learn more about what you can do with MyChart, go to [email or web address].    Your next appointment:   Follow up echocardiogram has been done.   Provider:   Christell Faith, PA-C

## 2023-03-05 ENCOUNTER — Telehealth: Payer: Self-pay | Admitting: *Deleted

## 2023-03-05 ENCOUNTER — Telehealth: Payer: Self-pay | Admitting: Cardiology

## 2023-03-05 NOTE — Progress Notes (Signed)
Cardiology Office Note    Date:  03/07/2023   ID:  William Jimenez, DOB [birth year], MRN [id number]  PCP:  Marguerita Merles, MD  Cardiologist:  Kate Sable, MD  Electrophysiologist:  None   Chief Complaint: Increased shortness of breath  History of Present Illness:   William Jimenez is a 57 y.o. male with history of nonobstructive CAD by [place] in 12/2022, HFrEF secondary to NICM, frequent PVCs, HTN, HLD, prediabetes, obesity, and OSA who presents for evaluation of shortness of breath.   He was admitted to [facility] from 9/17 through 09/03/2022 with acute HFrEF.  BNP 669.  High-sensitivity troponin peaked at 84.  Chest x-ray showed mild pulmonary edema and a small left-sided pleural effusion.  Echo showed an EF of 30 to 35%, mildly dilated LV internal cavity size with increased wall thickness, mildly dilated RV cavity size with normal systolic function, estimated right atrial pressure 5 to 10 mmHg, trivial mitral regurgitation, mild tricuspid regurgitation and mild to moderate pulmonary hypertension with a PASP estimated at 49 mmHg.  He reported symptomatic improvement with 1 dose of IV Lasix.  He was evaluated by cardiology in-house with recommendation for outpatient ischemic evaluation following initiation of GDMT.  Vania Rea and Entresto were cost prohibitive.  He was discharged on HCTZ, Lasix, spironolactone, and PTA lisinopril.  Initiation of beta-blocker was recommended in the outpatient setting.  Admission was also notable for transaminitis with ultrasound of the liver showing mild hepatic steatosis and abnormal LFTs suspected to be fatty liver disease versus congestive hepatopathy.   He followed up with Dr. Garen Lah as a new patient on 11/04/2022, and reported improvement in his edema though still noted exertional shortness of breath and abdominal distention.  He had run out of spironolactone.  He was started on carvedilol, and reinitiated on spironolactone, with furosemide titrated to 40 mg daily.   He reported a family history of CHF in his father.  He was seen in our office on 12/13/2022 and continued to note shortness of breath with ambulation with improvement in lower extremity edema and abdominal bloating.  He continued to note elevated BP readings in the [id number] to [id number] systolic, though believed the higher readings were taken prior to medications.   LHC on 12/20/2022 (RHC not ordered) showed mild to moderate single-vessel CAD with 30 to 40% mid LAD stenosis with question of focal myocardial bridging.  No significant disease was observed in the LCx or RCA.  LVEDP severely elevated at 35 to 40 mmHg.  Findings were consistent with nonischemic cardiomyopathy.  Following cath, he was given IV Lasix followed by titration of oral furosemide to 80 mg twice daily and titration of lisinopril to 20 mg daily.    He was seen in follow-up on 01/10/2023 and felt like he was back to his baseline, without symptoms of angina or cardiac decompensation.  He had stable two-pillow orthopnea.  He had cut back on salt intake.  He was drinking greater than 2 L of liquid daily.  He was adherent to his medications, though did believe he was taking lisinopril 80 mg rather than 40 mg.  He noted increased urinary output following titration of furosemide.  By our scale, his weight was up 10 pounds when compared to his visit in 11/2022, though up only 4 pounds when compared to his visit in 10/2022.  He was pleased with his improvement.  RHC was deferred given significant improvement in symptoms.  He was noted to have frequent PVCs on EKG.  Carvedilol was titrated to 25 mg twice daily with recommendation to take lisinopril 40 mg rather than 80 mg with continuation of spironolactone and furosemide.  Labs obtained at that time were indicative of uptrending renal function with a BUN of 21 and serum creatinine 1.24 (prior 16/1.17 respectively).  In this setting, it was recommended furosemide be decreased to 40 mg twice daily.  3-day Zio patch  was placed to quantify PVC burden showed a predominant rhythm of sinus with an average rate of 73 bpm with a PVC burden of 6%.  He was last seen in the office in 01/2023 and was without symptoms of angina or cardiac decompensation.  He was adherent to CPAP.  His weight was down 9 pounds when compared to his prior clinic visit.  Coreg was titrated to 37.5 mg twice daily with continuation of lisinopril and spironolactone.  Limited echo was ordered and is scheduled for next month.  He contacted our office on 03/05/2023 with shortness of breath that began on 03/04/2023.  In this setting, appointment was scheduled for today.  He comes in noting a 1 week history of shortness of breath with ambulation at work.  No chest pain, palpitations, dizziness, presyncope, or syncope.  No lower extremity swelling, abdominal distention.  He remains adherent to CPAP.  He has been taking furosemide 40 mg once daily rather than the prescribed twice daily dosing.  He does try and watch his salt intake.  He drinks approximately 2 L of water daily.  His weight is up 10 pounds by our scale today when compared to his last office visit.  He has not taken his morning medications.   Labs independently reviewed: 01/2023 - BUN 16, serum creatinine 1.27, potassium 4.3, albumin 4.2, AST/ALT normal 12/2022 - potassium 4.0, BUN 21, serum creatinine 1.24, magnesium 2.0 11/2022 - Hgb 14.8, PLT 283, TC 235, TG 80, HDL 55, LDL 164 08/2022 - A1c 6.4  Past Medical History:  Diagnosis Date   HLD (hyperlipidemia)    Hypertension    NICM (nonischemic cardiomyopathy) ([place])     Past Surgical History:  Procedure Laterality Date   CYSTOSCOPY     LEFT HEART CATH AND CORONARY ANGIOGRAPHY Left 12/20/2022   Procedure: LEFT HEART CATH AND CORONARY ANGIOGRAPHY;  Surgeon: Nelva Bush, MD;  Location: [facility];  Service: Cardiovascular;  Laterality: Left;    Current Medications: Current Meds  Medication Sig   acetaminophen (TYLENOL)  500 MG tablet Take 500 mg by mouth as needed.   albuterol (VENTOLIN HFA) 108 (90 Base) MCG/ACT inhaler Inhale 2 puffs into the lungs every 6 (six) hours as needed for wheezing or shortness of breath.   atorvastatin (LIPITOR) 20 MG tablet Take 1 tablet (20 mg total) by mouth daily.   carvedilol (COREG) 25 MG tablet Take 1.5 tablets (37.5 mg total) by mouth 2 (two) times daily with a meal.   furosemide (LASIX) 40 MG tablet Take 1 tablet (40 mg total) by mouth 2 (two) times daily.   lisinopril (ZESTRIL) 40 MG tablet Take 40 mg by mouth daily.   sertraline (ZOLOFT) 50 MG tablet Take 1 tablet (50 mg total) by mouth daily.   spironolactone (ALDACTONE) 25 MG tablet Take 1 tablet (25 mg total) by mouth daily.    Allergies:   Patient has no known allergies.   Social History   Socioeconomic History   Marital status: Married    Spouse name: Not on file   Number of children: Not on file  Years of education: Not on file   Highest education level: Not on file  Occupational History   Not on file  Tobacco Use   Smoking status: Never   Smokeless tobacco: Current    Types: Snuff  Vaping Use   Vaping Use: Never used  Substance and Sexual Activity   Alcohol use: No   Drug use: No   Sexual activity: Yes    Birth control/protection: None  Other Topics Concern   Not on file  Social History Narrative   Not on file   Social Determinants of Health   Financial Resource Strain: Not on file  Food Insecurity: Not on file  Transportation Needs: Not on file  Physical Activity: Not on file  Stress: Not on file  Social Connections: Not on file     Family History:  The patient's family history is negative for Prostate cancer, Bladder Cancer, and Kidney cancer.  ROS:   12-point review of systems is negative unless otherwise noted in the HPI.   EKGs/Labs/Other Studies Reviewed:    Studies reviewed were summarized above. The additional studies were reviewed today:  LHC  12/20/2022: Conclusions: Mild-moderate single vessel coronary artery disease with 30-40% mid LAD lesion; question focal myocardial bridging.  No significant disease observed in the LCx or RCA.  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 Severely elevated left heart filling pressures (LVEDP 35-40 mmHg).   Recommendations: Administer furosemide 40 mg IV x 1 now and increase standing furosemide to 80 mg PO twice daily. Increase lisinopril to 20 mg daily. Continue current doses of carvedilol and spironolactone. Obtain BMP in 1 week. Consider outpatient consultation with advanced heart failure team. __________   2D echo 09/02/2022 [facility]): Summary   1. Technically difficult study.    2. The left ventricle is mildly dilated in size with mildly increased wall  thickness.   3. The left ventricular systolic function is severely decreased, LVEF is  visually estimated at 30-35%.    4. The right ventricle is mildly dilated in size, with normal systolic  function.   5. IVC size and inspiratory change suggest mildly elevated right atrial  pressure. (5-10 mmHg).    6. There is mild-moderate pulmonary hypertension.    EKG:  EKG is ordered today.  The EKG ordered today demonstrates NSR, 60 bpm, lateral T wave inversion consistent with prior tracing  Recent Labs: 04/07/2022: B Natriuretic Peptide 143.3 12/13/2022: ALT 34; Hemoglobin 14.8; Platelets 283 01/10/2023: BUN 21; Creatinine, Ser 1.24; Magnesium 2.0; Potassium 4.0; Sodium 136  Recent Lipid Panel    Component Value Date/Time   CHOL 235 (H) 12/13/2022 1106   TRIG 80 12/13/2022 1106   HDL 55 12/13/2022 1106   CHOLHDL 4.3 12/13/2022 1106   VLDL 16 12/13/2022 1106   LDLCALC 164 (H) 12/13/2022 1106    PHYSICAL EXAM:    VS:  BP (!) 160/98 (BP Location: Left Arm, Patient Position: Sitting, Cuff Size: Large)   Pulse 60   Ht 5\' 11"  (1.803 m)   Wt (!) 359 lb 6.4 oz (163 kg)   SpO2 98%   BMI 50.13 kg/m   BMI: Body mass index is  50.13 kg/m.  Physical Exam Vitals reviewed.  Constitutional:      Appearance: He is well-developed.  HENT:     Head: Normocephalic and atraumatic.  Eyes:     General:        Right eye: No discharge.        Left eye: No discharge.  Neck:  Comments: JVD difficult to assess secondary to body habitus. Cardiovascular:     Rate and Rhythm: Normal rate and regular rhythm.     Heart sounds: Normal heart sounds, S1 normal and S2 normal. Heart sounds not distant. No midsystolic click and no opening snap. No murmur heard.    No friction rub.  Pulmonary:     Effort: Pulmonary effort is normal. No respiratory distress.     Breath sounds: Normal breath sounds. No decreased breath sounds, wheezing or rales.  Chest:     Chest wall: No tenderness.  Abdominal:     General: There is no distension.  Musculoskeletal:     Cervical back: Normal range of motion.     Right lower leg: No edema.     Left lower leg: No edema.  Skin:    General: Skin is warm and dry.     Nails: There is no clubbing.  Neurological:     Mental Status: He is alert and oriented to person, place, and time.  Psychiatric:        Speech: Speech normal.        Behavior: Behavior normal.        Thought Content: Thought content normal.        Judgment: Judgment normal.     Wt Readings from Last 3 Encounters:  03/07/23 (!) 359 lb 6.4 oz (163 kg)  02/05/23 (!) 349 lb (158.3 kg)  01/10/23 (!) 358 lb (162.4 kg)     ASSESSMENT & PLAN:   Acute on chronic HFrEF secondary to NICM: Volume status is difficult to assess on physical exam secondary to body habitus, though his weight is up 10 pounds when compared to his last clinic visit.  He has been taking furosemide 40 mg once daily rather than the recommended twice daily dosing.  I suspect this has contributed to some of his volume overload and dyspnea.  Titrate furosemide to 80 mg twice daily for 3 days followed by 40 mg twice daily thereafter.  Follow-up BMP in 1 week.   Otherwise, he remains on carvedilol, lisinopril and spironolactone.  At [facility], SGLT2 inhibitor and ARNI were felt to be cost prohibitive.  He is scheduled for limited echo next month on maximally tolerated GDMT.  If his cardiomyopathy persist at that time, would like to revisit the addition of Entresto and SGLT2 inhibitor along with pursuing cardiac MRI with potential referral to the advanced heart failure clinic and EP.  CHF education.  Nonobstructive CAD: He is without symptoms suggestive of angina.  Continue aggressive risk factor modification and primary prevention including aspirin and atorvastatin.  Frequent PVCs: Quiescent on titrated dose of carvedilol which will be continued as outlined above.  HTN: Blood pressure is elevated in the office today, though he has not yet taken any of his medications.  Escalate diuresis as outlined above with continuation of carvedilol, spironolactone, and lisinopril.  HLD: LDL 164 in 11/2022 with goal being less than 70.  He remains on atorvastatin 20 mg.  Future orders were placed for lipid panel and LFT to be drawn next week when he comes in for a BMP.  Obesity with OSA: Weight loss is advised through heartily diet and regular exercise.  May benefit from GLP-1 agonist.  Continue CPAP.   Disposition: F/u with Dr. Garen Lah or an APP after echo.   Medication Adjustments/Labs and Tests Ordered: Current medicines are reviewed at length with the patient today.  Concerns regarding medicines are outlined above. Medication changes, Labs and  Tests ordered today are summarized above and listed in the Patient Instructions accessible in Encounters.   Melvern Banker, PA-C 03/07/2023 11:20 AM     [place], [place] [id number] [phone]

## 2023-03-05 NOTE — Telephone Encounter (Signed)
Initial Assessment Questions  Breathing Difficulty-A-AH   1. RESPIRATORY STATUS: "I just feel like it's tough to catch my breath"  2. ONSET: "It started yesterday when I was at work"  3. PATTERN "It really only happens when I get up to do something like walk uphill to my mailbox"  4. SEVERITY: MILD: No SOB at rest, mild SOB with walking, speaks normally in sentences, can lie down, no retractions, pulse < 100.  5. RECURRENT SYMPTOM: "Not that I can think of, it just started at work yesterday"  6. CARDIAC HISTORY: "I was diagnosed with congestive heart failure"  7. LUNG HISTORY: "No nothing like that"  8. CAUSE: "I'm not sure"  9. OTHER SYMPTOMS: "No nothing, just a headache"  10. O2 SATURATION MONITOR:  "I don't have a way to check, but my mother in law has one I can use her's"  68. PREGNANCY: N/A  12. TRAVEL: "No travel recently"  Care Advice:  1: SEE HCP WITHIN 2 WEEKS: * You need to be seen for this ongoing problem within the next 2 weeks. * PCP VISIT: Call your doctor (or NP/PA) during regular office hours and make an appointment. * IF PATIENT HAS NO PCP: A primary care clinic is where you need to be seen for chronic health problems. NOTE: Try to help caller find a PCP (e.g., use a physician referral line). Having a PCP or 'medical home' means better long-term care. 2: GENERAL CARE ADVICE FOR BREATHING DIFFICULTY: * Find position of greatest comfort. For most patients the best position is semi-upright (e.g., sitting up in a comfortable chair or lying back against pillows). * Elevate head of bed (e.g., use pillows or place blocks under bed). * Avoid smoke or fume exposure. * Create a draft (e.g., use a fan directed at the face, or open a window). * Keep room temperature slightly on the cool side. * Limit activities or space activities apart during the day. Prioritize activities. * Use a humidifier. 3: CALL BACK IF: * Severe difficulty breathing occurs * Fever more than  100.4 F (38.0 C) * You become worse 4: CARE ADVICE given per Breathing Difficulty (Adult) guideline  Patient scheduled Friday 03/07/23 @ 10:30 am

## 2023-03-05 NOTE — Telephone Encounter (Signed)
-----   Message from Valora Corporal, RN sent at 12/17/2022  4:26 PM EST ----- Regarding: Labs in March Lipid & LFT to be done in February 17, 2023

## 2023-03-05 NOTE — Telephone Encounter (Signed)
Left voicemail message to call back about some repeat labs that we need him to have done.

## 2023-03-05 NOTE — Telephone Encounter (Signed)
Pt c/o Shortness Of Breath: STAT if SOB developed within the last 24 hours or pt is noticeably SOB on the phone  1. Are you currently SOB (can you hear that pt is SOB on the phone)? No  2. How long have you been experiencing SOB? Yesterday  3. Are you SOB when sitting or when up moving around? Moving around  4. Are you currently experiencing any other symptoms? No

## 2023-03-07 ENCOUNTER — Ambulatory Visit: Payer: 59 | Attending: Physician Assistant | Admitting: Physician Assistant

## 2023-03-07 ENCOUNTER — Encounter: Payer: Self-pay | Admitting: Physician Assistant

## 2023-03-07 VITALS — BP 160/98 | HR 60 | Ht 71.0 in | Wt 359.4 lb

## 2023-03-07 DIAGNOSIS — Z6841 Body Mass Index (BMI) 40.0 and over, adult: Secondary | ICD-10-CM

## 2023-03-07 DIAGNOSIS — E7800 Pure hypercholesterolemia, unspecified: Secondary | ICD-10-CM

## 2023-03-07 DIAGNOSIS — Z79899 Other long term (current) drug therapy: Secondary | ICD-10-CM

## 2023-03-07 DIAGNOSIS — G4733 Obstructive sleep apnea (adult) (pediatric): Secondary | ICD-10-CM

## 2023-03-07 DIAGNOSIS — I428 Other cardiomyopathies: Secondary | ICD-10-CM | POA: Diagnosis not present

## 2023-03-07 DIAGNOSIS — I2510 Atherosclerotic heart disease of native coronary artery without angina pectoris: Secondary | ICD-10-CM | POA: Diagnosis not present

## 2023-03-07 DIAGNOSIS — I5020 Unspecified systolic (congestive) heart failure: Secondary | ICD-10-CM | POA: Diagnosis not present

## 2023-03-07 DIAGNOSIS — I493 Ventricular premature depolarization: Secondary | ICD-10-CM

## 2023-03-07 DIAGNOSIS — I10 Essential (primary) hypertension: Secondary | ICD-10-CM

## 2023-03-07 DIAGNOSIS — E785 Hyperlipidemia, unspecified: Secondary | ICD-10-CM | POA: Diagnosis not present

## 2023-03-07 NOTE — Patient Instructions (Signed)
Medication Instructions:  Your physician has recommended you make the following change in your medication:   TAKE Furosemide (Lasix) 80 mg twice a day for 3 days THEN take 40 mg twice a day  *If you need a refill on your cardiac medications before your next appointment, please call your pharmacy*   Lab Work: BMET, Fasting Lipid & liver panel to be done in one week. These are fasting labs so nothing to eat or drink after midnight before except sip of water with your medications. No appointment is needed. Just go to the registration desk to check in at the following location:   [place] at [facility] 1st desk on the right to check in (REGISTRATION)  Lab hours: Monday- Friday (7:30 am- 5:30 pm)  If you have labs (blood work) drawn today and your tests are completely normal, you will receive your results only by: MyChart Message (if you have MyChart) OR A paper copy in the mail If you have any lab test that is abnormal or we need to change your treatment, we will call you to review the results.   Testing/Procedures: None   Follow-Up: At [facility], you and your health needs are our priority.  As part of our continuing mission to provide you with exceptional heart care, we have created designated Provider Care Teams.  These Care Teams include your primary Cardiologist (physician) and Advanced Practice Providers (APPs -  Physician Assistants and Nurse Practitioners) who all work together to provide you with the care you need, when you need it.   Your next appointment:   Follow up after echocardiogram has been done.   Provider:   Kate Sable, MD or Christell Faith, PA-C

## 2023-03-07 NOTE — Telephone Encounter (Signed)
Patient in to see provider today here in our office. Closing encounter.

## 2023-03-28 ENCOUNTER — Ambulatory Visit: Payer: 59 | Attending: Physician Assistant

## 2023-03-28 DIAGNOSIS — I428 Other cardiomyopathies: Secondary | ICD-10-CM

## 2023-03-28 LAB — ECHOCARDIOGRAM LIMITED
Area-P 1/2: 4.6 cm2
S' Lateral: 5.6 cm

## 2023-03-28 MED ORDER — PERFLUTREN LIPID MICROSPHERE
1.0000 mL | INTRAVENOUS | Status: AC | PRN
  Administered 2023-03-28: 2 mL via INTRAVENOUS

## 2023-04-01 ENCOUNTER — Telehealth: Payer: Self-pay | Admitting: *Deleted

## 2023-04-01 NOTE — Telephone Encounter (Signed)
Left voicemail message to call back for review of results.  

## 2023-04-01 NOTE — Telephone Encounter (Signed)
Patient is returning call.  °

## 2023-04-01 NOTE — Telephone Encounter (Signed)
-----   Message from Sondra Barges, PA-C sent at 03/28/2023  5:13 PM EDT ----- Please inform the patient his echo showed a pump function of 35-40%, mildly stiffened heart, and aortic valve thickening without evidence of narrowing.  When compared to prior echo, pulm function has improved from 30% to 40%.  Keep scheduled appointment later this month to discuss further medication adjustments and assess symptoms.

## 2023-04-01 NOTE — Telephone Encounter (Signed)
Reviewed results and recommendations with patient. He verbalized understanding with no further questions at this time.  

## 2023-04-03 NOTE — Progress Notes (Deleted)
Cardiology Office Note    Date:  04/03/2023   ID:  William Jimenez, DOB Jul 26, 1966, MRN [id number]  PCP:  [name], MD  Cardiologist:  Debbe Odea, MD  Electrophysiologist:  None   Chief Complaint: Follow-up  History of Present Illness:   William Jimenez is a 57 y.o. male with history of nonobstructive CAD by LHC in 12/2022, HFrEF secondary to NICM, frequent PVCs, HTN, HLD, prediabetes, obesity, and OSA who presents for follow-up of his cardiomyopathy.   He was admitted to [facility] from 9/17 through 09/03/2022 with acute HFrEF.  BNP 669.  High-sensitivity troponin peaked at 84.  Chest x-ray showed mild pulmonary edema and a small left-sided pleural effusion.  Echo showed an EF of 30 to 35%, mildly dilated LV internal cavity size with increased wall thickness, mildly dilated RV cavity size with normal systolic function, estimated right atrial pressure 5 to 10 mmHg, trivial mitral regurgitation, mild tricuspid regurgitation and mild to moderate pulmonary hypertension with a PASP estimated at 49 mmHg.  He reported symptomatic improvement with 1 dose of IV Lasix.  He was evaluated by cardiology in-house with recommendation for outpatient ischemic evaluation following initiation of GDMT.  [name] and Entresto were cost prohibitive.  He was discharged on HCTZ, Lasix, spironolactone, and PTA lisinopril.  Initiation of beta-blocker was recommended in the outpatient setting.  Admission was also notable for transaminitis with ultrasound of the liver showing mild hepatic steatosis and abnormal LFTs suspected to be fatty liver disease versus congestive hepatopathy.   He followed up with Dr. Azucena Cecil as a new patient on 11/04/2022, and reported improvement in his edema though still noted exertional shortness of breath and abdominal distention.  He had run out of spironolactone.  He was started on carvedilol, and reinitiated on spironolactone, with furosemide titrated to 40 mg daily.  He reported a family  history of CHF in his father.  He was seen in our office on 12/13/2022 and continued to note shortness of breath with ambulation with improvement in lower extremity edema and abdominal bloating.  He continued to note elevated BP readings in the 130s to 170s systolic, though believed the higher readings were taken prior to medications.   LHC on 12/20/2022 (RHC not ordered) showed mild to moderate single-vessel CAD with 30 to 40% mid LAD stenosis with question of focal myocardial bridging.  No significant disease was observed in the LCx or RCA.  LVEDP severely elevated at 35 to 40 mmHg.  Findings were consistent with nonischemic cardiomyopathy.  Following cath, he was given IV Lasix followed by titration of oral furosemide to 80 mg twice daily and titration of lisinopril to 20 mg daily.    He was seen in follow-up on 01/10/2023 and felt like he was back to his baseline, without symptoms of angina or cardiac decompensation.  He had stable two-pillow orthopnea.  He had cut back on salt intake.  He was drinking greater than 2 L of liquid daily.  He was adherent to his medications, though did believe he was taking lisinopril 80 mg rather than 40 mg.  He noted increased urinary output following titration of furosemide.  By our scale, his weight was up 10 pounds when compared to his visit in 11/2022, though up only 4 pounds when compared to his visit in 10/2022.  He was pleased with his improvement.  RHC was deferred given significant improvement in symptoms.  He was noted to have frequent PVCs on EKG.  Carvedilol was titrated to  25 mg twice daily with recommendation to take lisinopril 40 mg rather than 80 mg with continuation of spironolactone and furosemide.  Labs obtained at that time were indicative of uptrending renal function with a BUN of 21 and serum creatinine 1.24 (prior 16/1.17 respectively).  In this setting, it was recommended furosemide be decreased to 40 mg twice daily.  3-day Zio patch was placed to  quantify PVC burden showed a predominant rhythm of sinus with an average rate of 73 bpm with a PVC burden of 6%.  He was seen in the office in 01/2023 and was without symptoms of angina or cardiac decompensation.  He was adherent to CPAP.  His weight was down 9 pounds when compared to his prior clinic visit.  Coreg was titrated to 37.5 mg twice daily with continuation of lisinopril and spironolactone.    He was last seen in the office in 02/2023 noting an increase in shortness of breath.  He has been taking furosemide 40 mg daily rather than twice daily.  His weight was up 10 pounds by our scale when compared to his prior visit.  It was recommended he titrate furosemide 80 mg twice daily for 3 days followed by 40 mg twice daily thereafter.  Follow-up labs remain pending.  Echo in 03/2023 showed an EF of 35 to 40%, global hypokinesis, moderately dilated LV internal cavity size, grade 2 diastolic dysfunction, normal RV systolic function and ventricular cavity size, aortic valve sclerosis without evidence of stenosis, and an estimated right atrial pressure of 3 mmHg.  ***   Labs independently reviewed: 01/2023 - BUN 16, serum creatinine 1.27, potassium 4.3, albumin 4.2, AST/ALT normal 12/2022 - potassium 4.0, BUN 21, serum creatinine 1.24, magnesium 2.0 11/2022 - Hgb 14.8, PLT 283, TC 235, TG 80, HDL 55, LDL 164 08/2022 - A1c 6.4  Past Medical History:  Diagnosis Date   HLD (hyperlipidemia)    Hypertension    NICM (nonischemic cardiomyopathy) (HCC)     Past Surgical History:  Procedure Laterality Date   CYSTOSCOPY     LEFT HEART CATH AND CORONARY ANGIOGRAPHY Left 12/20/2022   Procedure: LEFT HEART CATH AND CORONARY ANGIOGRAPHY;  Surgeon: Yvonne Kendall, MD;  Location: [facility] INVASIVE CV LAB;  Service: Cardiovascular;  Laterality: Left;    Current Medications: No outpatient medications have been marked as taking for the 04/11/23 encounter (Appointment) with Sondra Barges, PA-C.    Allergies:    Patient has no known allergies.   Social History   Socioeconomic History   Marital status: Married    Spouse name: Not on file   Number of children: Not on file   Years of education: Not on file   Highest education level: Not on file  Occupational History   Not on file  Tobacco Use   Smoking status: Never   Smokeless tobacco: Current    Types: Snuff  Vaping Use   Vaping Use: Never used  Substance and Sexual Activity   Alcohol use: No   Drug use: No   Sexual activity: Yes    Birth control/protection: None  Other Topics Concern   Not on file  Social History Narrative   Not on file   Social Determinants of Health   Financial Resource Strain: Not on file  Food Insecurity: Not on file  Transportation Needs: Not on file  Physical Activity: Not on file  Stress: Not on file  Social Connections: Not on file     Family History:  The patient's family history  is negative for Prostate cancer, Bladder Cancer, and Kidney cancer.  ROS:   12-point review of systems is negative unless otherwise noted in the HPI.   EKGs/Labs/Other Studies Reviewed:    Studies reviewed were summarized above. The additional studies were reviewed today:  Limited echo 03/28/2023: 1. Left ventricular ejection fraction, by estimation, is 35 to 40%. The  left ventricle has moderately decreased function. The left ventricle  demonstrates global hypokinesis. The left ventricular internal cavity size  was moderately dilated. Left  ventricular diastolic parameters are consistent with Grade II diastolic  dysfunction (pseudonormalization).   2. Right ventricular systolic function is normal. The right ventricular  size is normal.   3. The mitral valve is normal in structure. No evidence of mitral valve  regurgitation. No evidence of mitral stenosis.   4. The aortic valve was not well visualized. Aortic valve regurgitation  is not visualized. Aortic valve sclerosis is present, with no evidence of  aortic  valve stenosis.   5. The inferior vena cava is normal in size with greater than 50%  respiratory variability, suggesting right atrial pressure of 3 mmHg.   Comparison(s): LHC on 12/20/22 reported 30% LVEF.  __________  Luci Bank patch 12/2022: Patient had a min HR of 49 bpm, max HR of 119 bpm, and avg HR of 73 bpm. Predominant underlying rhythm was Sinus Rhythm. No Isolated SVEs, SVE Couplets, or SVE Triplets were present. Isolated VEs were frequent (6.0%, 23231), VE Couplets were occasional  (1.2%, 2290), and no VE Triplets were present. Ventricular Bigeminy was present.   Conclusion Frequent PVCs, 6% burden. No other significant arrhythmias. __________  LHC 12/20/2022: Conclusions: Mild-moderate single vessel coronary artery disease with 30-40% mid LAD lesion; question focal myocardial bridging.  No significant disease observed in the LCx or RCA.  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 Severely elevated left heart filling pressures (LVEDP 35-40 mmHg).   Recommendations: Administer furosemide 40 mg IV x 1 now and increase standing furosemide to 80 mg PO twice daily. Increase lisinopril to 20 mg daily. Continue current doses of carvedilol and spironolactone. Obtain BMP in 1 week. Consider outpatient consultation with advanced heart failure team. __________   2D echo 09/02/2022 [facility]): Summary   1. Technically difficult study.    2. The left ventricle is mildly dilated in size with mildly increased wall  thickness.   3. The left ventricular systolic function is severely decreased, LVEF is  visually estimated at 30-35%.    4. The right ventricle is mildly dilated in size, with normal systolic  function.   5. IVC size and inspiratory change suggest mildly elevated right atrial  pressure. (5-10 mmHg).    6. There is mild-moderate pulmonary hypertension.    EKG:  EKG is ordered today.  The EKG ordered today demonstrates ***  Recent Labs: 04/07/2022: B Natriuretic Peptide  143.3 12/13/2022: ALT 34; Hemoglobin 14.8; Platelets 283 01/10/2023: BUN 21; Creatinine, Ser 1.24; Magnesium 2.0; Potassium 4.0; Sodium 136  Recent Lipid Panel    Component Value Date/Time   CHOL 235 (H) 12/13/2022 1106   TRIG 80 12/13/2022 1106   HDL 55 12/13/2022 1106   CHOLHDL 4.3 12/13/2022 1106   VLDL 16 12/13/2022 1106   LDLCALC 164 (H) 12/13/2022 1106    PHYSICAL EXAM:    VS:  There were no vitals taken for this visit.  BMI: There is no height or weight on file to calculate BMI.  Physical Exam  Wt Readings from Last 3 Encounters:  03/07/23 (!) 359  lb 6.4 oz (163 kg)  02/05/23 (!) 349 lb (158.3 kg)  01/10/23 (!) 358 lb (162.4 kg)     ASSESSMENT & PLAN:   HFrEF secondary to NICM:  Nonobstructive CAD:   Frequent PVCs:  HTN: Blood pressure    HLD: LDL 164 in 11/2022 with goal being less than 70.  Obesity with OSA:    {Are you ordering a CV Procedure (e.g. stress test, cath, DCCV, TEE, etc)?   Press F2        :[id number]}     Disposition: F/u with Dr. Azucena Cecil or an APP in ***.   Medication Adjustments/Labs and Tests Ordered: Current medicines are reviewed at length with the patient today.  Concerns regarding medicines are outlined above. Medication changes, Labs and Tests ordered today are summarized above and listed in the Patient Instructions accessible in Encounters.   Signed, Eula Listen, PA-C 04/03/2023 3:28 PM     [facility] - [place] [address] [place], [place] [address] [phone]

## 2023-04-11 ENCOUNTER — Ambulatory Visit: Payer: PRIVATE HEALTH INSURANCE | Admitting: Physician Assistant

## 2023-05-02 ENCOUNTER — Ambulatory Visit: Payer: 59 | Attending: Physician Assistant | Admitting: Nurse Practitioner

## 2023-05-02 NOTE — Progress Notes (Deleted)
Office Visit    Patient Name: William Jimenez Date of Encounter: 05/02/2023  Primary Care Provider:  [name], MD Primary Cardiologist:  Debbe Odea, MD  Chief Complaint    ***  Past Medical History    Past Medical History:  Diagnosis Date   HLD (hyperlipidemia)    Hypertension    NICM (nonischemic cardiomyopathy) [facility])    Past Surgical History:  Procedure Laterality Date   CYSTOSCOPY     LEFT HEART CATH AND CORONARY ANGIOGRAPHY Left 12/20/2022   Procedure: LEFT HEART CATH AND CORONARY ANGIOGRAPHY;  Surgeon: Yvonne Kendall, MD;  Location: [facility] INVASIVE CV LAB;  Service: Cardiovascular;  Laterality: Left;    Allergies  No Known Allergies  History of Present Illness    ***  Home Medications    Current Outpatient Medications  Medication Sig Dispense Refill   acetaminophen (TYLENOL) 500 MG tablet Take 500 mg by mouth as needed.     albuterol (VENTOLIN HFA) 108 (90 Base) MCG/ACT inhaler Inhale 2 puffs into the lungs every 6 (six) hours as needed for wheezing or shortness of breath. 8 g 2   atorvastatin (LIPITOR) 20 MG tablet Take 1 tablet (20 mg total) by mouth daily. 90 tablet 3   carvedilol (COREG) 25 MG tablet Take 1.5 tablets (37.5 mg total) by mouth 2 (two) times daily with a meal. 45 tablet 11   furosemide (LASIX) 40 MG tablet Take 1 tablet (40 mg total) by mouth 2 (two) times daily. 180 tablet 3   lisinopril (ZESTRIL) 40 MG tablet Take 40 mg by mouth daily.     sertraline (ZOLOFT) 50 MG tablet Take 1 tablet (50 mg total) by mouth daily.     sildenafil (VIAGRA) 100 MG tablet Take 100 mg by mouth. (Patient not taking: Reported on 03/07/2023)     spironolactone (ALDACTONE) 25 MG tablet Take 1 tablet (25 mg total) by mouth daily. 30 tablet 3   No current facility-administered medications for this visit.     Review of Systems    ***.  All other systems reviewed and are otherwise negative except as noted above.    Physical Exam    VS:  There were  no vitals taken for this visit. , BMI There is no height or weight on file to calculate BMI.     GEN: Well nourished, well developed, in no acute distress. HEENT: normal. Neck: Supple, no JVD, carotid bruits, or masses. Cardiac: RRR, no murmurs, rubs, or gallops. No clubbing, cyanosis, edema.  Radials 2+/PT 2+ and equal bilaterally.  Respiratory:  Respirations regular and unlabored, clear to auscultation bilaterally. GI: Soft, nontender, nondistended, BS + x 4. MS: no deformity or atrophy. Skin: warm and dry, no rash. Neuro:  Strength and sensation are intact. Psych: Normal affect.  Accessory Clinical Findings    ECG personally reviewed by me today - *** - no acute changes.  Lab Results  Component Value Date   WBC 6.6 12/13/2022   HGB 14.8 12/13/2022   HCT 45.7 12/13/2022   MCV 81.3 12/13/2022   PLT 283 12/13/2022   Lab Results  Component Value Date   CREATININE 1.24 01/10/2023   BUN 21 (H) 01/10/2023   NA 136 01/10/2023   K 4.0 01/10/2023   CL 103 01/10/2023   CO2 22 01/10/2023   Lab Results  Component Value Date   ALT 34 12/13/2022   AST 30 12/13/2022   ALKPHOS 53 12/13/2022   BILITOT 0.9 12/13/2022   Lab  Results  Component Value Date   CHOL 235 (H) 12/13/2022   HDL 55 12/13/2022   LDLCALC 164 (H) 12/13/2022   TRIG 80 12/13/2022   CHOLHDL 4.3 12/13/2022    Lab Results  Component Value Date   HGBA1C 6.7 (H) 04/07/2022    Assessment & Plan    1.  ***   Nicolasa Ducking, NP 05/02/2023, 3:04 PM

## 2023-05-05 ENCOUNTER — Encounter: Payer: Self-pay | Admitting: Nurse Practitioner

## 2023-06-02 ENCOUNTER — Other Ambulatory Visit: Payer: Self-pay

## 2023-06-02 MED ORDER — SPIRONOLACTONE 25 MG PO TABS: 25.0000 mg | ORAL_TABLET | Freq: Every day | ORAL | 0 refills | Status: DC

## 2023-06-02 NOTE — Telephone Encounter (Signed)
Last visit 03/07/23 Next visit 06/06/23  Requested Prescriptions   Signed Prescriptions Disp Refills   spironolactone (ALDACTONE) 25 MG tablet 30 tablet 0    Sig: Take 1 tablet (25 mg total) by mouth daily.    Authorizing Provider: Debbe Odea    Ordering User: Guerry Minors

## 2023-06-05 NOTE — Progress Notes (Signed)
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 He presents today for follow up regarding shortness of breath.  Past Medical History    Past Medical History:  Diagnosis Date   HLD (hyperlipidemia)    Hypertension    NICM (nonischemic cardiomyopathy) (HCC)    Past Surgical History:  Procedure Laterality Date   CYSTOSCOPY     LEFT HEART CATH AND CORONARY ANGIOGRAPHY Left 12/20/2022   Procedure: LEFT HEART CATH AND CORONARY ANGIOGRAPHY;  Surgeon: Yvonne Kendall, MD;  Location: [facility] INVASIVE CV LAB;  Service: Cardiovascular;  Laterality: Left;  Allergies No Known Allergies Labs/Other Studies Reviewed  The following studies were reviewed today: Cardiac Studies & Procedures   CARDIAC CATHETERIZATION  CARDIAC CATHETERIZATION 12/20/2022  Narrative Conclusions: Mild-moderate single vessel coronary artery disease with 30-40% mid LAD lesion; question focal myocardial bridging.  No significant disease observed in the LCx or RCA.  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 Severely elevated left heart filling pressures (LVEDP 35-40 mmHg).  Recommendations: Administer furosemide 40 mg IV x 1 now and increase standing furosemide to 80 mg PO twice daily. Increase lisinopril to 20 mg daily. Continue current doses of carvedilol and spironolactone. Obtain BMP in 1 week. Consider outpatient consultation with advanced heart failure team.  Yvonne Kendall, MD [facility]  Findings Coronary Findings Diagnostic  Dominance: Right  Left Main Vessel is large. Vessel is angiographically normal.  Left Anterior Descending Vessel is large. Mid LAD lesion is 35% stenosed.  First  Diagonal Branch Vessel is moderate in size.  Second Diagonal Branch Vessel is moderate in size.  Left Circumflex Vessel is large.  First Obtuse Marginal Branch Vessel is small in size.  Second Obtuse Marginal Branch Vessel is large in size.  Lateral Second Obtuse Marginal Branch Vessel is moderate in size.  Third Obtuse Marginal Branch Vessel is small in size.  Right Coronary Artery Vessel is large. Vessel is angiographically normal.  Right Posterior Descending Artery Vessel is moderate in size.  Right Posterior Atrioventricular Artery Vessel is moderate in size.  First Right Posterolateral Branch Vessel is small in size.  Second Right Posterolateral Branch Vessel is small in size.  Intervention  No interventions have been documented.     ECHOCARDIOGRAM  ECHOCARDIOGRAM LIMITED 03/28/2023  Narrative ECHOCARDIOGRAM LIMITED REPORT    Patient Name:   William Jimenez Date of Exam: 03/28/2023 Medical Rec #:  [id number]     Height:       71.0 in Accession #:    [id number]    Weight:       359.4 lb Date of Birth:  [birth year]     BSA:          2.705 m Patient Age:    56 years      BP:           164/100 mmHg Patient Gender: M             HR:           65 bpm. Exam Location:  [facility]  Procedure: Limited Echo, Intracardiac Opacification Agent and Limited Color Doppler  Indications:     I42.80 Non-ischemic cardiomyopathy  History:  Patient has no prior history of Echocardiogram examinations. Cardiomyopathy, Signs/Symptoms:Chest Pain and Shortness of Breath; Risk Factors:Hypertension, Dyslipidemia, Non-Smoker and Sleep Apnea.  Sonographer:     Quentin Ore RDMS, RVT, RDCS Referring Phys:  [id number] Raymon Mutton DUNN Diagnosing Phys: Julien Nordmann MD  IMPRESSIONS   1. Left ventricular ejection fraction, by estimation, is 35 to 40%. The left ventricle has moderately decreased function. The left ventricle demonstrates global hypokinesis. The left ventricular  internal cavity size was moderately dilated. Left ventricular diastolic parameters are consistent with Grade II diastolic dysfunction (pseudonormalization). 2. Right ventricular systolic function is normal. The right ventricular size is normal. 3. The mitral valve is normal in structure. No evidence of mitral valve regurgitation. No evidence of mitral stenosis. 4. The aortic valve was not well visualized. Aortic valve regurgitation is not visualized. Aortic valve sclerosis is present, with no evidence of aortic valve stenosis. 5. The inferior vena cava is normal in size with greater than 50% respiratory variability, suggesting right atrial pressure of 3 mmHg.  Comparison(s): LHC on 12/20/22 reported 30% LVEF.  FINDINGS Left Ventricle: Left ventricular ejection fraction, by estimation, is 35 to 40%. The left ventricle has moderately decreased function. The left ventricle demonstrates global hypokinesis. Definity contrast agent was given IV to delineate the left ventricular endocardial borders. The left ventricular internal cavity size was moderately dilated. There is no left ventricular hypertrophy. Left ventricular diastolic parameters are consistent with Grade II diastolic dysfunction (pseudonormalization).  Right Ventricle: The right ventricular size is normal. No increase in right ventricular wall thickness. Right ventricular systolic function is normal.  Left Atrium: Left atrial size was normal in size.  Right Atrium: Right atrial size was normal in size.  Pericardium: There is no evidence of pericardial effusion.  Mitral Valve: The mitral valve is normal in structure. Mild mitral annular calcification. No evidence of mitral valve stenosis.  Tricuspid Valve: The tricuspid valve is normal in structure. Tricuspid valve regurgitation is not demonstrated. No evidence of tricuspid stenosis.  Aortic Valve: The aortic valve was not well visualized. Aortic valve regurgitation is not visualized.  Aortic valve sclerosis is present, with no evidence of aortic valve stenosis.  Pulmonic Valve: The pulmonic valve was normal in structure. Pulmonic valve regurgitation is not visualized. No evidence of pulmonic stenosis.  Aorta: The aortic root is normal in size and structure.  Venous: The inferior vena cava is normal in size with greater than 50% respiratory variability, suggesting right atrial pressure of 3 mmHg.  IAS/Shunts: No atrial level shunt detected by color flow Doppler.  LEFT VENTRICLE PLAX 2D LVIDd:         6.70 cm Diastology LVIDs:         5.60 cm LV e' medial:    4.68 cm/s LV PW:         0.90 cm LV E/e' medial:  22.2 LV IVS:        0.90 cm LV e' lateral:   4.03 cm/s LV E/e' lateral: 25.8   MITRAL VALVE MV Area (PHT): 4.60 cm MV Decel Time: 165 msec MV E velocity: 104.00 cm/s MV A velocity: 67.60 cm/s MV E/A ratio:  1.54  Julien Nordmann MD Electronically signed by Julien Nordmann MD Signature Date/Time: 03/28/2023/5:03:57 PM    Final (Updated)    MONITORS  LONG TERM MONITOR (3-14 DAYS) 02/04/2023  Narrative Patch Wear Time:  4 days and 2 hours (2024-01-30T18:46:09-499 to 2024-02-03T21:36:33-0500)  Patient had a min HR of 49 bpm, max HR of 119 bpm, and  avg HR of 73 bpm. Predominant underlying rhythm was Sinus Rhythm. No Isolated SVEs, SVE Couplets, or SVE Triplets were present. Isolated VEs were frequent (6.0%, 23231), VE Couplets were occasional (1.2%, 2290), and no VE Triplets were present. Ventricular Bigeminy was present.  Conclusion Frequent PVCs, 6% burden. No other significant arrhythmias.          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   Previously admitted to [facility] in 08/2022 for acute HFrEF, his BNP at that time was 669. Echo indicated an EF of 30 to 35%, mildly dilated LV internal cavity size with increased wall thickness, mildly dilated RV cavity size with normal systolic function, estimated right atrial pressure 5 to , trivial mitral regurgitation, mild tricuspid regurgitation and mild to moderate pulmonary hypertension with a PASP estimated at . He was discharged with plans for outpatient ischemic workup, he was started on hydrochlorothiazide, lasix, spironolactone and lisinporil. Not started on Jardiance or entresto as felt to be cost prohibitive.   First evaluated by Dr. Azucena Cecil on 11/04/22 for shortness of breath and abdominal distension. He was started on Lasix 40mg  daily and his edema improved. He then noted abdominal bloating, continued shortness of breath, orthopnea and hypertension. He was started on Coreg 12.5 mg BID, spironolactone 25mg  daily and Lasix was increased to 40mg  BID.   On 12/20/22 he underwent left heart catheterization that indicated mild-moderate single vessel coronary artery disease with 30-40% mid LAD lesion, LVEDP was severely elevated at 35-53mmHg, findings were consistent with non-ischemic cardiomyopathy. Following his LHC he was given IV lasix followed by titration of oral furosemide to 80mg  twice daily and lisinopril was increased to 20mg  daily.   He was last seen in office on 03/07/23 for worsening shortness of breath with ambulation at work. He had no chest pain, palpitations or syncope. No lower extremity swelling or abdominal distention. He reported only taking furosemide 40mg  once daily rather than the prescribed twice daily dosing. His weight was up 10lbs when compared to  his last office visit. He was directed to take furosemide 80mg  twice daily for three days followed by 40mg  twice daily with a follow up BMP a week after, BMP was not completed.   Echocardiogram on 03/28/23 indicated an EF of 35 to 40% with the LV had moderately decreased function with global hypokinesis, the left ventricular internal cavity was moderately dilated. LV diastolic parameters consistent with Grade II diastolic dysfunction. RV systolic function was normal. Aortic valve sclerosis was present with no evidence of aortic valve stenosis.   Today reports he is doing well overall. Reports DOE that is relieved with rest. He denies palpitations. Denies abdominal distension or lower extremity edema. He sleeps with CPAP nightly, currently sleeping on two to three pillows a night, which is his baseline. He has been working to decrease his portion sizes.    Home Medications    Current Outpatient Medications  Medication Sig Dispense Refill  hydrALAZINE (APRESOLINE) 10 MG tablet Take 1 tablet (10 mg total) by mouth 3 (three) times daily. 90 tablet 3   isosorbide mononitrate (IMDUR) 30 MG 24 hr tablet Take 0.5 tablets (15 mg total) by mouth daily. 45 tablet 1   acetaminophen (TYLENOL) 500 MG tablet Take 500 mg by mouth as needed.     albuterol (VENTOLIN HFA) 108 (90 Base) MCG/ACT inhaler Inhale 2 puffs into the lungs every 6 (six) hours as needed for wheezing or shortness of breath. 8 g 2   atorvastatin (LIPITOR) 20 MG tablet Take 1 tablet (20 mg total) by mouth daily. 90 tablet 3   carvedilol (COREG) 25 MG tablet Take 1.5 tablets (37.5 mg total) by mouth 2 (two) times daily with a meal. 45 tablet 11   furosemide (LASIX) 40 MG tablet Take 1 tablet (40 mg total) by mouth 2 (two) times daily. 180 tablet 3   lisinopril (ZESTRIL) 40 MG tablet Take 40 mg by mouth daily.     sertraline (ZOLOFT) 50 MG tablet Take 1 tablet (50 mg total) by mouth daily.     sildenafil (VIAGRA) 100 MG tablet Take 100 mg by  mouth. (Patient not taking: Reported on 03/07/2023)     spironolactone (ALDACTONE) 25 MG tablet Take 1 tablet (25 mg total) by mouth daily. 30 tablet 0   No current facility-administered medications for this visit.     Review of Systems    He denies chest pain, palpitations, pnd, orthopnea,  dizziness, syncope, edema, or weight gain. All other systems reviewed and are otherwise negative except as noted above.      Physical Exam    VS:  BP 136/84   Pulse 63   Ht 5\' 11"  (1.803 m)   Wt (!) 352 lb 3.2 oz (159.8 kg)   SpO2 98%   BMI 49.12 kg/m  , BMI Body mass index is 49.12 kg/m.     GEN: Well nourished, well developed, in no acute distress. HEENT: normal. Neck: Supple, JVD difficult to assess with body habitus, carotid bruits, or masses. Cardiac: RRR, no murmurs, rubs, or gallops. No clubbing, cyanosis, edema.  Radials/DP/PT 2+ and equal bilaterally.  Respiratory:  Respirations regular and unlabored, clear to auscultation bilaterally. GI: Soft, nontender, nondistended, BS + x 4. MS: no deformity or atrophy. Skin: warm and dry, no rash. Neuro:  Strength and sensation are intact. Psych: Normal affect.  Accessory Clinical Findings    ECG personally reviewed by me today - Normal sinus rhythm at 63bpm, with minimal voltage criteria for LVH (R in aVL), T wave inversion in lateral leads- no acute changes    Lab Results  Component Value Date   WBC 6.6 12/13/2022   HGB 14.8 12/13/2022   HCT 45.7 12/13/2022   MCV 81.3 12/13/2022   PLT 283 12/13/2022   Lab Results  Component Value Date   CREATININE 1.24 01/10/2023   BUN 21 (H) 01/10/2023   NA 136 01/10/2023   K 4.0 01/10/2023   CL 103 01/10/2023   CO2 22 01/10/2023   Lab Results  Component Value Date   ALT 34 12/13/2022   AST 30 12/13/2022   ALKPHOS 53 12/13/2022   BILITOT 0.9 12/13/2022   Lab Results  Component Value Date   CHOL 235 (H) 12/13/2022   HDL 55 12/13/2022   LDLCALC 164 (H) 12/13/2022   TRIG 80  12/13/2022   CHOLHDL 4.3 12/13/2022    Lab Results  Component Value Date   HGBA1C 6.7 (H) 04/07/2022  Assessment & Plan   Chronic HFrEF secondary to NICM: His last echo indicated a slightly improved EF of 35 to 40% with LV moderately decreased function with global hypokinesis. Today he reports he is doing well, notes some mild DOE, relieved with rest. He appears euvolemic, although difficult to assess with body habitus. He states he has no lower extremity swelling or abdominal distension. Confirmed he has been taking furosemide 40mg  twice daily. His weight is down 7lbs from last visit in March. He notes insurance concerns for SGLT2i and ARNi, does not feel he can afford. Currently does not monitor his blood pressure or weight at home, discussed HF precautions. His initial blood pressure today was elevated at 142/80, discussed starting hydralazine with nitrate as it has been shown to improve morbidity and mortality in African American men, he is agreeable to this. With his chronic HFrEF secondary to NICM, he is agreeable to have cardiac MRI to evaluate further causes of his HFrEF. Check hemoglobin/hematocrit and Bmet to evaluate kidney function and electrolyte status. Will also refer to heart failure clinic. Continue carvedilol 37.5mg  daily, furosemide 40mg  twice daily, lisinopril 40mg  daily and spironolactone 25mg  daily.  Wt Readings from Last 3 Encounters:  06/06/23 (!) 352 lb 3.2 oz (159.8 kg)  03/07/23 (!) 359 lb 6.4 oz (163 kg)  02/05/23 (!) 349 lb (158.3 kg)    Nonobstructive CAD: LHC on 12/20/22 indicated mild-moderate single vessel coronary artery disease with 30-40% mid LAD lesion, LVEDP was severely elevated at 35-107mmHg, findings were consistent with non-ischemic cardiomyopathy. He denies anginal symptoms today. Continue atorvastatin.   Frequent PVCs: Denies palpitations. Continue Coreg 37.5mg  twice daily.    HTN: Slightly elevated on initial check at 142/88, recheck was 136/84. Will  start on hydralazine with nitrate as noted above. Continue Coreg 37.5mg  twice daily, furosemide 40mg  daily, lisinopril 40mg  daily and spironolactone 25mg  daily.   HLD: Last lipid panel on 12/13/22 indicated total cholesterol of 235 and LDL of 164. Following this was started on atorvastatin, he did not complete his labs in March. Continue atorvastatin 20mg  daily. Check fasting lipid panel next week.   Obesity: Current BMI 49.12, he is working to decrease his weight. He has decreased his portion size. Plans to talk to PCP regarding GLP1 agonist.   Sleep apnea: Reports compliance with CPAP.   Follow up in three months, after cardiac MRI and appointment with Heart Failure team.     Rip Harbour, NP 06/06/2023, 4:54 PM

## 2023-06-06 ENCOUNTER — Ambulatory Visit: Payer: 59 | Attending: Physician Assistant | Admitting: Cardiology

## 2023-06-06 ENCOUNTER — Encounter: Payer: Self-pay | Admitting: Nurse Practitioner

## 2023-06-06 VITALS — BP 136/84 | HR 63 | Ht 71.0 in | Wt 352.2 lb

## 2023-06-06 DIAGNOSIS — I428 Other cardiomyopathies: Secondary | ICD-10-CM | POA: Diagnosis not present

## 2023-06-06 DIAGNOSIS — I5022 Chronic systolic (congestive) heart failure: Secondary | ICD-10-CM

## 2023-06-06 DIAGNOSIS — I10 Essential (primary) hypertension: Secondary | ICD-10-CM

## 2023-06-06 DIAGNOSIS — I493 Ventricular premature depolarization: Secondary | ICD-10-CM

## 2023-06-06 DIAGNOSIS — I2510 Atherosclerotic heart disease of native coronary artery without angina pectoris: Secondary | ICD-10-CM

## 2023-06-06 DIAGNOSIS — E785 Hyperlipidemia, unspecified: Secondary | ICD-10-CM

## 2023-06-06 MED ORDER — ISOSORBIDE MONONITRATE ER 30 MG PO TB24: 15.0000 mg | ORAL_TABLET | Freq: Every day | ORAL | 1 refills | Status: DC

## 2023-06-06 MED ORDER — HYDRALAZINE HCL 10 MG PO TABS: 10.0000 mg | ORAL_TABLET | Freq: Three times a day (TID) | ORAL | 3 refills | Status: DC

## 2023-06-06 NOTE — Patient Instructions (Addendum)
Medication Instructions:  START Hydralazine 10 mg three times daily  START Isosorbide (Imdur) 15 mg once daily (half of the 30 mg tablet)  *If you need a refill on your cardiac medications before your next appointment, please call your pharmacy*  Lab Work: Your provider would like for you to have following labs drawn: Hemoglobin, BMET, Liver, and fasting Lipid.   Please go to the [facility] entrance and check in at the front desk.  You do not need an appointment.  They are open from 7am-6 pm.   If you have labs (blood work) drawn today and your tests are completely normal, you will receive your results only by: MyChart Message (if you have MyChart) OR A paper copy in the mail If you have any lab test that is abnormal or we need to change your treatment, we will call you to review the results.   Testing/Procedures: Your physician has requested that you have a cardiac MRI. Cardiac MRI uses a computer to create images of your heart as its beating, producing both still and moving pictures of your heart and major blood vessels. For further information please visit [email or web address]. Please follow the instruction sheet given to you today for more information.    Follow-Up: At [facility], you and your health needs are our priority.  As part of our continuing mission to provide you with exceptional heart care, we have created designated Provider Care Teams.  These Care Teams include your primary Cardiologist (physician) and Advanced Practice Providers (APPs -  Physician Assistants and Nurse Practitioners) who all work together to provide you with the care you need, when you need it.  We recommend signing up for the patient portal called "MyChart".  Sign up information is provided on this After Visit Summary.  MyChart is used to connect with patients for Virtual Visits (Telemedicine).  Patients are able to view lab/test results, encounter notes, upcoming appointments, etc.   Non-urgent messages can be sent to your provider as well.   To learn more about what you can do with MyChart, go to [email or web address].    Your next appointment:   3 month(s)  Provider:   You may see Debbe Odea, MD or one of the following Advanced Practice Providers on your designated Care Team:   Nicolasa Ducking, NP Eula Listen, PA-C Cadence Fransico Michael, PA-C Charlsie Quest, NP    A referral has been placed to the Advanced Heart Failure Clinic Other Instructions   You are scheduled for Cardiac MRI on ______________. Please arrive for your appointment at ______________ ( arrive 30-45 minutes prior to test start time). ?  [facility] [address] [place], [place] [address] [phone] Please take advantage of the free valet parking available at the MAIN entrance. Proceed to [facility] registration for check-in (first floor).  Magnetic resonance imaging (MRI) is a painless test that produces images of the inside of the body without using Xrays.  During an MRI, strong magnets and radio waves work together in a Data processing manager to form detailed images.   MRI images may provide more details about a medical condition than X-rays, CT scans, and ultrasounds can provide.  You may be given earphones to listen for instructions.  You may eat a light breakfast and take medications as ordered with the exception of furosemide, hydrochlorothiazide, or spironolactone(fluid pill, other). Please avoid stimulants for 12 hr prior to test. (Ie. Caffeine, nicotine, chocolate, or antihistamine medications)  If a contrast material will be used,  an IV will be inserted into one of your veins. Contrast material will be injected into your IV. It will leave your body through your urine within a day. You may be told to drink plenty of fluids to help flush the contrast material out of your system.  You will be asked to remove all metal, including: Watch, jewelry, and other metal  objects including hearing aids, hair pieces and dentures. Also wearable glucose monitoring systems (ie. Freestyle Libre and Omnipods) (Braces and fillings normally are not a problem.)   TEST WILL TAKE APPROXIMATELY 1 HOUR  PLEASE NOTIFY SCHEDULING AT LEAST 24 HOURS IN ADVANCE IF YOU ARE UNABLE TO KEEP YOUR APPOINTMENT. [phone]  For more information and frequently asked questions, please visit our website : [email or web address]  Please call Rockwell Alexandria, cardiac imaging nurse navigator with any questions/concerns. Rockwell Alexandria RN Navigator Cardiac Imaging Larey Brick RN Navigator Cardiac Imaging [facility] [phone] Office

## 2023-06-09 ENCOUNTER — Telehealth: Payer: Self-pay | Admitting: Cardiology

## 2023-06-09 NOTE — Telephone Encounter (Signed)
  Pt c/o medication issue:  1. Name of Medication: hydrALAZINE (APRESOLINE) 10 MG tablet isosorbide mononitrate (IMDUR) 30 MG 24 hr tablet  2. How are you currently taking this medication (dosage and times per day)? As written   3. Are you having a reaction (difficulty breathing--STAT)? No   4. What is your medication issue? The patient called to report that since starting this medications, he has been experiencing feelings of sickness and nausea. Additionally, he has noticed intermittent chest discomfort, which he described as mild but noticeable.

## 2023-06-09 NOTE — Telephone Encounter (Signed)
Left message to call back  

## 2023-06-09 NOTE — Telephone Encounter (Signed)
Pt returning call

## 2023-06-16 NOTE — Telephone Encounter (Signed)
Left voicemail message to call back  

## 2023-06-20 NOTE — Telephone Encounter (Signed)
Multiple attempts have been made to contact the patient. William Jimenez has not returned our calls. Closing this encounter.   He is scheduled for a Cardiac MRI on 07/23/23.

## 2023-07-03 ENCOUNTER — Other Ambulatory Visit (HOSPITAL_COMMUNITY): Payer: Self-pay

## 2023-07-03 ENCOUNTER — Ambulatory Visit (HOSPITAL_BASED_OUTPATIENT_CLINIC_OR_DEPARTMENT_OTHER): Payer: 59 | Admitting: Family

## 2023-07-03 ENCOUNTER — Encounter: Payer: Self-pay | Admitting: Family

## 2023-07-03 ENCOUNTER — Telehealth (HOSPITAL_COMMUNITY): Payer: Self-pay

## 2023-07-03 ENCOUNTER — Other Ambulatory Visit
Admission: RE | Admit: 2023-07-03 | Discharge: 2023-07-03 | Disposition: A | Discharge status: Home / Self Care | Payer: 59 | Source: Ambulatory Visit | Attending: Family | Admitting: Family

## 2023-07-03 VITALS — BP 123/76 | HR 64 | Wt 351.6 lb

## 2023-07-03 DIAGNOSIS — I2510 Atherosclerotic heart disease of native coronary artery without angina pectoris: Secondary | ICD-10-CM

## 2023-07-03 DIAGNOSIS — I10 Essential (primary) hypertension: Secondary | ICD-10-CM | POA: Diagnosis not present

## 2023-07-03 DIAGNOSIS — G4733 Obstructive sleep apnea (adult) (pediatric): Secondary | ICD-10-CM | POA: Diagnosis not present

## 2023-07-03 DIAGNOSIS — I5022 Chronic systolic (congestive) heart failure: Secondary | ICD-10-CM | POA: Diagnosis present

## 2023-07-03 LAB — BASIC METABOLIC PANEL
Anion gap: 5 (ref 5–15)
BUN: 23 mg/dL — ABNORMAL HIGH (ref 6–20)
CO2: 27 mmol/L (ref 22–32)
Calcium: 8.6 mg/dL — ABNORMAL LOW (ref 8.9–10.3)
Chloride: 101 mmol/L (ref 98–111)
Creatinine, Ser: 1.5 mg/dL — ABNORMAL HIGH (ref 0.61–1.24)
GFR, Estimated: 55 mL/min — ABNORMAL LOW (ref >60)
Glucose, Bld: 107 mg/dL — ABNORMAL HIGH (ref 70–99)
Potassium: 4.3 mmol/L (ref 3.5–5.1)
Sodium: 133 mmol/L — ABNORMAL LOW (ref 135–145)

## 2023-07-03 LAB — BRAIN NATRIURETIC PEPTIDE: B Natriuretic Peptide: 23.5 pg/mL (ref 0.0–100.0)

## 2023-07-03 NOTE — Patient Instructions (Signed)
Begin weighing daily and call for an overnight weight gain of 3 pounds or more or a weekly weight gain of more than 5 pounds.  

## 2023-07-03 NOTE — Progress Notes (Signed)
Advanced Heart Failure Clinic Note   Referring Physician: Reather Littler, NP [facility] cardiology) PCP: [name], MD Cardiologist: Debbe Odea, MD (last seen 06/24)  HPI:  William Jimenez is a 57 y/o male with a history of nonobstructive CAD by LHC in 12/2022, HFrEF secondary to NICM, frequent PVCs, HTN, HLD, prediabetse, obesity, chronic back pain, OSA and chronic heart failure.    First evaluated by Dr. Azucena Cecil on 11/04/22 for shortness of breath and abdominal distension. He was started on Lasix 40mg  daily and his edema improved. He then noted abdominal bloating, continued shortness of breath, orthopnea and hypertension. He was started on Coreg 12.5 mg BID, spironolactone 25mg  daily and Lasix was increased to 40mg  BID.   On 12/20/22 he underwent left heart catheterization that indicated mild-moderate single vessel coronary artery disease with 30-40% mid LAD lesion, LVEDP was severely elevated at 35-20mmHg, findings were consistent with non-ischemic cardiomyopathy. Following his LHC he was given IV lasix followed by titration of oral furosemide to 80mg  twice daily and lisinopril was increased to 20mg  daily.   Holter monitor worn 02/04/23: Patient had a min HR of 49 bpm, max HR of 119 bpm, and avg HR of 73 bpm. Predominant underlying rhythm was Sinus Rhythm. No Isolated SVEs, SVE Couplets, or SVE Triplets were present. Isolated VEs were frequent (6.0%, 23231), VE Couplets were occasional (1.2%, 2290), and no VE Triplets were present. Ventricular Bigeminy was present. Conclusion: Frequent PVCs, 6% burden. No other significant arrhythmias.  Was in the ED 09/01/22 due to worsening shortness of breath on exertion and orthopnea x 2 weeks. IV diuresed. Elevated troponin due to demand ischemia in the setting of suspected CHF exacerbation and not ACS.   Echo 09/02/22: EF 30-35% with mild/ moderate pulmonary HTN Echo 03/28/23: EF 35-40% along with Grade II DD, aortic valve thickening without evidence of  narrowing  LHC 12/20/22: Mild-moderate single vessel coronary artery disease with 30-40% mid LAD lesion; question focal myocardial bridging.  No significant disease observed in the LCx or RCA.  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 Severely elevated left heart filling pressures (LVEDP 35-40 mmHg).  He presents today for his initial visit with a chief complaint of minimal SOB with moderate exertion. Chronic in nature. Has associated fatigue and dizziness with sudden position changes along with this. Denies chest pain, palpitations, cough, abdominal distention, pedal edema or difficulty sleeping. Is wearing his CPAP nightly  He says that he took 1 dose of hydralazine and isosorbide and then he started sweating along with having chest pain. Symptoms resolved on its own and he decided that he didn't want to take anymore of either of these medications and has had no further chest pain.   Has had discussions with providers about entresto and SGLT2 but is concerned about the cost of these medications.   Review of Systems: [y] = yes, [ ]  = no   General: Weight gain [ ] ; Weight loss [ ] ; Anorexia [ ] ; Fatigue [place] ]; Fever [ ] ; Chills [ ] ; Weakness [ ]   Cardiac: Chest pain/pressure [ ] ; Resting SOB [ ] ; Exertional SOB [ y]; Orthopnea [ ] ; Pedal Edema [ ] ; Palpitations [ ] ; Syncope [ ] ; Presyncope [ ] ; Paroxysmal nocturnal dyspnea[ ]   Pulmonary: Cough [ ] ; Wheezing[ ] ; Hemoptysis[ ] ; Sputum [ ] ; Snoring [ ]   GI: Vomiting[ ] ; Dysphagia[ ] ; Melena[ ] ; Hematochezia [ ] ; Heartburn[ ] ; Abdominal pain [ ] ; Constipation [ ] ; Diarrhea [ ] ; BRBPR [ ]   GU: Hematuria[ ] ; Dysuria [ ] ;  Nocturia[ ]   Vascular: Pain in legs with walking [ ] ; Pain in feet with lying flat [ ] ; Non-healing sores [ ] ; Stroke [ ] ; TIA [ ] ; Slurred speech [ ] ;  Neuro: Headaches[ ] ; Vertigo[ ] ; Seizures[ ] ; Paresthesias[ ] ;Blurred vision [ ] ; Diplopia [ ] ; Vision changes [ ]   Ortho/Skin: Arthritis [ ] ; Joint pain [ ] ; Muscle pain  [ ] ; Joint swelling [ ] ; Back Pain [ ] ; Rash [ ]   Psych: Depression[ ] ; Anxiety[ ]   Heme: Bleeding problems [ ] ; Clotting disorders [ ] ; Anemia [ ]   Endocrine: Diabetes [ ] ; Thyroid dysfunction[ ]    Past Medical History:  Diagnosis Date   HLD (hyperlipidemia)    Hypertension    NICM (nonischemic cardiomyopathy) (HCC)     Current Outpatient Medications  Medication Sig Dispense Refill   acetaminophen (TYLENOL) 500 MG tablet Take 500 mg by mouth as needed.     albuterol (VENTOLIN HFA) 108 (90 Base) MCG/ACT inhaler Inhale 2 puffs into the lungs every 6 (six) hours as needed for wheezing or shortness of breath. 8 g 2   atorvastatin (LIPITOR) 20 MG tablet Take 1 tablet (20 mg total) by mouth daily. 90 tablet 3   carvedilol (COREG) 25 MG tablet Take 1.5 tablets (37.5 mg total) by mouth 2 (two) times daily with a meal. 45 tablet 11   furosemide (LASIX) 40 MG tablet Take 1 tablet (40 mg total) by mouth 2 (two) times daily. 180 tablet 3   hydrALAZINE (APRESOLINE) 10 MG tablet Take 1 tablet (10 mg total) by mouth 3 (three) times daily. 90 tablet 3   isosorbide mononitrate (IMDUR) 30 MG 24 hr tablet Take 0.5 tablets (15 mg total) by mouth daily. 45 tablet 1   lisinopril (ZESTRIL) 40 MG tablet Take 40 mg by mouth daily.     sertraline (ZOLOFT) 50 MG tablet Take 1 tablet (50 mg total) by mouth daily.     sildenafil (VIAGRA) 100 MG tablet Take 100 mg by mouth. (Patient not taking: Reported on 03/07/2023)     spironolactone (ALDACTONE) 25 MG tablet Take 1 tablet (25 mg total) by mouth daily. 30 tablet 0   No current facility-administered medications for this visit.    No Known Allergies    Social History   Socioeconomic History   Marital status: Married    Spouse name: Not on file   Number of children: Not on file   Years of education: Not on file   Highest education level: Not on file  Occupational History   Not on file  Tobacco Use   Smoking status: Never   Smokeless tobacco: Current     Types: Snuff  Vaping Use   Vaping status: Never Used  Substance and Sexual Activity   Alcohol use: No   Drug use: No   Sexual activity: Yes    Birth control/protection: None  Other Topics Concern   Not on file  Social History Narrative   Not on file   Social Determinants of Health   Financial Resource Strain: Low Risk  (09/02/2022)   Received [facility]   Overall Financial Resource Strain (CARDIA)    Difficulty of Paying Living Expenses: Not hard at all  Food Insecurity: No Food Insecurity (09/02/2022)   Received from [facility]   Hunger Vital Sign    Worried About Running Out of Food in the Last Year: Never true    Ran Out of Food in the Last Year: Never true  Transportation  Needs: No Transportation Needs (09/02/2022)   Received from [facility] - Transportation    Lack of Transportation (Medical): No    Lack of Transportation (Non-Medical): No  Physical Activity: Not on file  Stress: Not on file  Social Connections: Not on file  Intimate Partner Violence: Not on file      Family History  Problem Relation Age of Onset   Prostate cancer Neg Hx    Bladder Cancer Neg Hx    Kidney cancer Neg Hx    Vitals:   07/03/23 1503  BP: 123/76  Pulse: 64  SpO2: 98%  Weight: (!) 351 lb 9.6 oz (159.5 kg)   Wt Readings from Last 3 Encounters:  07/03/23 (!) 351 lb 9.6 oz (159.5 kg)  06/06/23 (!) 352 lb 3.2 oz (159.8 kg)  03/07/23 (!) 359 lb 6.4 oz (163 kg)   Lab Results  Component Value Date   CREATININE 1.24 01/10/2023   CREATININE 1.17 12/13/2022   CREATININE 1.48 (H) 07/12/2022   PHYSICAL EXAM: General:  Well appearing. No respiratory difficulty HEENT: normal Neck: supple. no JVD. No lymphadenopathy or thyromegaly appreciated. Cor: PMI nondisplaced. Regular rate & rhythm. No rubs, gallops or murmurs. Lungs: clear Abdomen: soft, nontender, nondistended. No hepatosplenomegaly. No bruits or masses.  Extremities: no cyanosis, clubbing, rash,  edema Neuro: alert & oriented x 3, cranial nerves grossly intact. moves all 4 extremities w/o difficulty. Affect pleasant.  ECG: not done   ASSESSMENT & PLAN:  1: Chronic NICM with reduced ejection fraction- - suspect due to sleep apnea/ HTN as cath showed nonobstructive CAD - NYHA class II - euvolemic - not weighing but has scales; instructed to call for overnight weight gain of > 2 pounds or a weekly weight gain of > 5 pounds - Echo 09/02/22: EF 30-35% with mild/ moderate pulmonary HTN - Echo 03/28/23: EF 35-40% along with Grade II DD, aortic valve thickening without evidence of narrowing - has cMRI scheduled for 07/23/23 - not adding salt to his food - continue carvedilol 37.5mg  BID - continue furosemide 40mg  BID - continue lisinopril 40mg  daily - continue spironolactone 25mg  daily - continue to hold hydralazine 10mg  TID/ isosorbide MN 15mg  daily as he developed chest pain after taking 1 dose but he doesn't know which med caused it as he only took 1 dose and took both of the meds at the same time; if we restart these, will start 1 at a time to see which caused the chest pain - discussed stopping lisinopril and beginning entresto as well as adding SGLT2; he is agreeable but only if they are affordable - will reach out to PharmD advocate in [place] regarding his insurance coverage - BNP 09/02/22 was 669.97; repeat today  2: HTN- - BP 123/76 - saw PCP William Jimenez) @ [facility] next week - BMP 01/10/23 showed sodium 136, potassium 4.0, creatinine 1.24 & GFR >60 - BMP today  3: Nonobstructive CAD- - LHC 12/20/22: Mild-moderate single vessel coronary artery disease with 30-40% mid LAD lesion; question focal myocardial bridging.  No significant disease observed in the LCx or RCA.  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 Severely elevated left heart filling pressures (LVEDP 35-40 mmHg). - saw cardiology Shelva Majestic) 06/24 - continue atorvastatin 20mg  daily  4: Sleep apnea- - wearing  CPAP nightly and says that he's sleeping well  Return in 1 month, sooner if needed.    Delma Freeze, FNP 07/03/23

## 2023-07-03 NOTE — Telephone Encounter (Addendum)
Advanced Heart Failure Patient Advocate Encounter  Prior authorization for [name] has been submitted and approved. Test billing returns $143.20 for 30 day supply. Patient is being advised to use savings card for $10 copay  Key: [id number] Effective: 07/03/2023 to 07/01/2024  Burnell Blanks, CPhT Rx Patient Advocate Phone: [phone]

## 2023-07-04 ENCOUNTER — Other Ambulatory Visit (HOSPITAL_COMMUNITY): Payer: Self-pay

## 2023-07-04 ENCOUNTER — Telehealth: Payer: Self-pay

## 2023-07-04 MED ORDER — ENTRESTO 24-26 MG PO TABS: 1.0000 | ORAL_TABLET | Freq: Two times a day (BID) | ORAL | 6 refills | Status: DC

## 2023-07-04 MED ORDER — FUROSEMIDE 40 MG PO TABS: 40.0000 mg | ORAL_TABLET | Freq: Every day | ORAL | 3 refills | Status: DC

## 2023-07-04 NOTE — Telephone Encounter (Signed)
  Spoke with pt regarding FNP recommendations.  Begin Entresto 24/26 mg (1 tablet) 2 times a day . Voucher added to RX. When starting Entresto, decrease Lasix to 40 mg (1 tablet) daily and additional 40 mg only as needed. Stop Lisinopril when begin Entresto. Do not take both.  Pt aware, agreeable, and verbalized understanding.        Delma Freeze, FNP 07/03/2023  4:53 PM EDT Back to Top    Needs to stop lisinopril when he starts the entresto. Do NOT take them both    Delma Freeze, FNP 07/03/2023  4:18 PM EDT     Labs are stable. Begin entresto 24/26mg  twice daily (will need free voucher codes attached to RX). Will give commercial copay card at next visit and pharmacist says it will only cost you $10/ month. When starting entresto, decrease furosemide to 40mg  AM and additional 40mg  only if needed. Repeat BMP at next visit

## 2023-07-09 ENCOUNTER — Encounter: Payer: Self-pay | Admitting: Cardiology

## 2023-07-10 ENCOUNTER — Encounter: Payer: Self-pay | Admitting: Student in an Organized Health Care Education/Training Program

## 2023-07-10 ENCOUNTER — Ambulatory Visit
Admission: RE | Admit: 2023-07-10 | Discharge: 2023-07-10 | Disposition: A | Discharge status: Home / Self Care | Payer: 59 | Source: Ambulatory Visit | Attending: Student in an Organized Health Care Education/Training Program | Admitting: Student in an Organized Health Care Education/Training Program

## 2023-07-10 ENCOUNTER — Ambulatory Visit (HOSPITAL_BASED_OUTPATIENT_CLINIC_OR_DEPARTMENT_OTHER): Payer: 59 | Admitting: Student in an Organized Health Care Education/Training Program

## 2023-07-10 VITALS — BP 133/84 | HR 72 | Temp 96.6°F | Resp 18 | Ht 71.0 in | Wt 253.9 lb

## 2023-07-10 DIAGNOSIS — M170 Bilateral primary osteoarthritis of knee: Secondary | ICD-10-CM | POA: Insufficient documentation

## 2023-07-10 DIAGNOSIS — G894 Chronic pain syndrome: Secondary | ICD-10-CM

## 2023-07-10 DIAGNOSIS — M47816 Spondylosis without myelopathy or radiculopathy, lumbar region: Secondary | ICD-10-CM | POA: Insufficient documentation

## 2023-07-10 DIAGNOSIS — M5136 Other intervertebral disc degeneration, lumbar region: Secondary | ICD-10-CM | POA: Insufficient documentation

## 2023-07-10 NOTE — Progress Notes (Signed)
Patient: William Jimenez  Service Category: E/M  Provider: Edward Jolly, MD  DOB: 08/16/1966  DOS: 07/10/2023  Referring Provider: [name], MD  MRN: [id number]  Setting: Ambulatory outpatient  PCP: [name], MD  Type: New Patient  Specialty: Interventional Pain Management    Location: Office  Delivery: Face-to-face     Primary Reason(s) for Visit: Encounter for initial evaluation of one or more chronic problems (new to examiner) potentially causing chronic pain, and posing a threat to normal musculoskeletal function. (Level of risk: High) CC: Back Pain (lower) and Knee Pain (bilat)  HPI  Mr. Mahler is a 57 y.o. year old, male patient, who comes for the first time to our practice referred by [name], MD for our initial evaluation of his chronic pain. He has Hypertensive emergency; Hypertension; HLD (hyperlipidemia); Obesity, Class III, BMI 40-49.9 (morbid obesity) (HCC); Hypokalemia; Elevated troponin; Chest pain; Cough; OSA (obstructive sleep apnea); Chronic systolic heart failure (HCC); Lumbar spondylosis; Lumbar degenerative disc disease; Bilateral primary osteoarthritis of knee; and Chronic pain syndrome on their problem list. Today he comes in for evaluation of his Back Pain (lower) and Knee Pain (bilat)  Pain Assessment: Location: Lower Back Radiating: to LEFT hip; "sometimes sudden sharp pain in RIGHT buttock" Onset: More than a month ago Duration: Chronic pain Quality: Tightness, Sharp Severity: 6 /10 (subjective, self-reported pain score)  Effect on ADL: limits daily activities; "I can't walk my dogs anymore" Timing: Constant Modifying factors: rest BP: 133/84  HR: 72  Onset and Duration: Gradual and Started with accident Cause of pain: Work related accident or event Severity: Getting worse, NAS-11 at its worse: 103/10, NAS-11 at its best: 6/10, NAS-11 now: 3/10, and NAS-11 on the average: 3/10 Timing: Not influenced by the time of the day and During activity or  exercise Aggravating Factors: Bending, Climbing, Kneeling, Lifiting, Prolonged sitting, Prolonged standing, Twisting, Walking, Walking uphill, Walking downhill, and Working Alleviating Factors: Bending, Resting, Sitting, and Chiropractic manipulations Associated Problems: Day-time cramps, Erectile dysfunction, and Pain that does not allow patient to sleep Quality of Pain: Pulsating, Shooting, Stabbing, and Uncomfortable Previous Examinations or Tests: The patient denies test Previous Treatments: Physical Therapy, Relaxation therapy, Steroid treatments by mouth, Strengthening exercises, and Stretching exercises  Mr. Brumett is being evaluated for possible interventional pain management therapies for the treatment of his chronic pain.   Hx of low back pain, with occasional radiation to left hip. States that over 20 years, there was perhaps a lifting injury where he felt a "pop" in his lower back. Sitting and laying down helps with the pain, standing and walking makes it worse. Has done PT in the past  (<10 years go), helped in short term. History of bilateral knee pain related to knee OA. States that he has had knee arthrocentesis for effusion.  Mr. Binford has been informed that this initial visit was an evaluation only.  On the follow up appointment I will go over the results, including ordered tests and available interventional therapies. At that time he will have the opportunity to decide whether to proceed with offered therapies or not. In the event that Mr. Russom prefers avoiding interventional options, this will conclude our involvement in the case.  Medication management recommendations may be provided upon request.  Historic Controlled Substance Pharmacotherapy Review   Historical Monitoring: The patient  reports no history of drug use. List of prior UDS Testing: Lab Results  Component Value Date   MDMA NONE DETECTED 04/07/2022  COCAINSCRNUR NONE DETECTED 04/07/2022   PCPSCRNUR NONE  DETECTED 04/07/2022   THCU NONE DETECTED 04/07/2022   Historical Background Evaluation: [place] PMP: PDMP reviewed during this encounter. Review of the past 88-months conducted.              [place] Department of public safety, offender search: Engineer, mining Information) Non-contributory Risk Assessment Profile: Aberrant behavior: None observed or detected today Risk factors for fatal opioid overdose: None identified today Fatal overdose hazard ratio (HR): Calculation deferred Non-fatal overdose hazard ratio (HR): Calculation deferred Risk of opioid abuse or dependence: 0.7-3.0% with doses ? 36 MME/day and 6.1-26% with doses ? 120 MME/day. Substance use disorder (SUD) risk level: See below Personal History of Substance Abuse (SUD-Substance use disorder):  Alcohol: Negative  Illegal Drugs: Negative  Rx Drugs: Negative  ORT Risk Level calculation: Low Risk  Opioid Risk Tool - 07/10/23 1307       Family History of Substance Abuse   Alcohol Negative    Illegal Drugs Negative    Rx Drugs Negative      Personal History of Substance Abuse   Alcohol Negative    Illegal Drugs Negative    Rx Drugs Negative      Age   Age between 55-45 years  No      History of Preadolescent Sexual Abuse   History of Preadolescent Sexual Abuse Negative or Male      Psychological Disease   Psychological Disease --   panic attacks, anxiety   Depression Negative      Total Score   Opioid Risk Tool Scoring 0    Opioid Risk Interpretation Low Risk            ORT Scoring interpretation table:  Score <3 = Low Risk for SUD  Score between 4-7 = Moderate Risk for SUD  Score >8 = High Risk for Opioid Abuse   PHQ-2 Depression Scale:  Total score: 1  PHQ-2 Scoring interpretation table: (Score and probability of major depressive disorder)  Score 0 = No depression  Score 1 = 15.4% Probability  Score 2 = 21.1% Probability  Score 3 = 38.4% Probability  Score 4 = 45.5% Probability  Score 5 = 56.4% Probability   Score 6 = 78.6% Probability   PHQ-9 Depression Scale:  Total score: 1  PHQ-9 Scoring interpretation table:  Score 0-4 = No depression  Score 5-9 = Mild depression  Score 10-14 = Moderate depression  Score 15-19 = Moderately severe depression  Score 20-27 = Severe depression (2.4 times higher risk of SUD and 2.89 times higher risk of overuse)   Pharmacologic Plan: As per protocol, I have not taken over any controlled substance management, pending the results of ordered tests and/or consults.            Initial impression: Pending review of available data and ordered tests.  Meds   Current Outpatient Medications:    acetaminophen (TYLENOL) 500 MG tablet, Take 500 mg by mouth as needed., Disp: , Rfl:    albuterol (VENTOLIN HFA) 108 (90 Base) MCG/ACT inhaler, Inhale 2 puffs into the lungs every 6 (six) hours as needed for wheezing or shortness of breath., Disp: 8 g, Rfl: 2   atorvastatin (LIPITOR) 20 MG tablet, Take 1 tablet (20 mg total) by mouth daily., Disp: 90 tablet, Rfl: 3   carvedilol (COREG) 25 MG tablet, Take 1.5 tablets (37.5 mg total) by mouth 2 (two) times daily with a meal., Disp: 45 tablet, Rfl: 11   furosemide (LASIX)  40 MG tablet, Take 1 tablet (40 mg total) by mouth daily. Take additional 40 mg (1 tablet) only as needed., Disp: 30 tablet, Rfl: 3   sacubitril-valsartan (ENTRESTO) 24-26 MG, Take 1 tablet by mouth 2 (two) times daily., Disp: 60 tablet, Rfl: 6   sertraline (ZOLOFT) 50 MG tablet, Take 1 tablet (50 mg total) by mouth daily., Disp: , Rfl:    spironolactone (ALDACTONE) 25 MG tablet, Take 1 tablet (25 mg total) by mouth daily., Disp: 30 tablet, Rfl: 0  Imaging Review   Narrative CLINICAL DATA:  Chronic low back pain.  EXAM: LUMBAR SPINE - 2-3 VIEW  COMPARISON:  MRI lumbar spine dated December 16, 2007.  FINDINGS: Five lumbar type vertebral bodies. No acute fracture or subluxation. Vertebral body heights are preserved. New 3 mm anterolisthesis at L4-L5.  Unchanged mild disc height loss at L3-L4 and L4-L5. The sacroiliac joints are unremarkable.  IMPRESSION: 1. New facet mediated 3 mm anterolisthesis at L4-L5. 2. Unchanged mild degenerative disc disease at L3-L4 and L4-L5.   Electronically Signed By: Obie Dredge M.D. On: 10/09/2018 17:11  DG Knee 1-2 Views Right  Narrative CLINICAL DATA:  Chronic right knee pain x 4-5 months  EXAM: RIGHT KNEE - 1-2 VIEW  COMPARISON:  None.  FINDINGS: No fracture or dislocation is seen.  Mild degenerative changes with sharpening of the tibial spines in tiny patellofemoral osteophytes.  Visualized soft tissues are within normal limits.  No definite suprapatellar knee joint effusion.  IMPRESSION: Mild degenerative changes.   Electronically Signed By: Charline Bills M.D. On: 11/13/2018 11:06   Complexity Note: Imaging results reviewed.                         ROS  Cardiovascular: Weak heart (CHF) Pulmonary or Respiratory: Shortness of breath and Temporary stoppage of breathing during sleep Neurological: No reported neurological signs or symptoms such as seizures, abnormal skin sensations, urinary and/or fecal incontinence, being born with an abnormal open spine and/or a tethered spinal cord Psychological-Psychiatric: Anxiousness and Prone to panicking Gastrointestinal: Reflux or heatburn Genitourinary: No reported renal or genitourinary signs or symptoms such as difficulty voiding or producing urine, peeing blood, non-functioning kidney, kidney stones, difficulty emptying the bladder, difficulty controlling the flow of urine, or chronic kidney disease Hematological: No reported hematological signs or symptoms such as prolonged bleeding, low or poor functioning platelets, bruising or bleeding easily, hereditary bleeding problems, low energy levels due to low hemoglobin or being anemic Endocrine: High blood sugar requiring insulin (IDDM) Rheumatologic: No reported rheumatological  signs and symptoms such as fatigue, joint pain, tenderness, swelling, redness, heat, stiffness, decreased range of motion, with or without associated rash Musculoskeletal: Negative for myasthenia gravis, muscular dystrophy, multiple sclerosis or malignant hyperthermia Work History: Working part time  Allergies  Mr. Lusby has No Known Allergies.  Laboratory Chemistry Profile   Renal Lab Results  Component Value Date   BUN 23 (H) 07/03/2023   CREATININE 1.50 (H) 07/03/2023   GFRAA >60 07/03/2013   GFRNONAA 55 (L) 07/03/2023   SPECGRAV 1.020 11/30/2021   PHUR 6.0 11/30/2021   PROTEINUR 1+ (A) 11/30/2021     Electrolytes Lab Results  Component Value Date   NA 133 (L) 07/03/2023   K 4.3 07/03/2023   CL 101 07/03/2023   CALCIUM 8.6 (L) 07/03/2023   MG 2.0 01/10/2023     Hepatic Lab Results  Component Value Date   AST 30 12/13/2022   ALT 34 12/13/2022  ALBUMIN 3.7 12/13/2022   ALKPHOS 53 12/13/2022     ID Lab Results  Component Value Date   HIV Non Reactive 04/07/2022   SARSCOV2NAA NEGATIVE 04/07/2022     Bone Lab Results  Component Value Date   TESTOFREE 7.4 04/28/2020   TESTOSTERONE 352 10/19/2021     Endocrine Lab Results  Component Value Date   GLUCOSE 107 (H) 07/03/2023   GLUCOSEU Negative 11/30/2021   HGBA1C 6.7 (H) 04/07/2022   TESTOFREE 7.4 04/28/2020   TESTOSTERONE 352 10/19/2021     Neuropathy Lab Results  Component Value Date   HGBA1C 6.7 (H) 04/07/2022   HIV Non Reactive 04/07/2022     CNS No results found for: "COLORCSF", "APPEARCSF", "RBCCOUNTCSF", "WBCCSF", "POLYSCSF", "LYMPHSCSF", "EOSCSF", "PROTEINCSF", "GLUCCSF", "JCVIRUS", "CSFOLI", "IGGCSF", "LABACHR", "ACETBL"   Inflammation (CRP: Acute  ESR: Chronic) No results found for: "CRP", "ESRSEDRATE", "LATICACIDVEN"   Rheumatology No results found for: "RF", "ANA", "LABURIC", "URICUR", "LYMEIGGIGMAB", "LYMEABIGMQN", "HLAB27"   Coagulation Lab Results  Component Value Date   INR  1.1 04/07/2022   LABPROT 13.8 04/07/2022   APTT 31 04/07/2022   PLT 283 12/13/2022   DDIMER 0.28 04/07/2022     Cardiovascular Lab Results  Component Value Date   BNP 23.5 07/03/2023   TROPONINI < 0.02 07/03/2013   HGB 14.8 12/13/2022   HCT 45.7 12/13/2022     Screening Lab Results  Component Value Date   SARSCOV2NAA NEGATIVE 04/07/2022   HIV Non Reactive 04/07/2022     Cancer No results found for: "CEA", "CA125", "LABCA2"   Allergens No results found for: "ALMOND", "APPLE", "ASPARAGUS", "AVOCADO", "BANANA", "BARLEY", "BASIL", "BAYLEAF", "GREENBEAN", "LIMABEAN", "WHITEBEAN", "BEEFIGE", "REDBEET", "BLUEBERRY", "BROCCOLI", "CABBAGE", "MELON", "CARROT", "CASEIN", "CASHEWNUT", "CAULIFLOWER", "CELERY"     Note: Lab results reviewed.  PFSH  Drug: Mr. Mogan  reports no history of drug use. Alcohol:  reports no history of alcohol use. Tobacco:  reports that he has never smoked. His smokeless tobacco use includes snuff. Medical:  has a past medical history of HLD (hyperlipidemia), Hypertension, and NICM (nonischemic cardiomyopathy) (HCC). Family: family history is not on file.  Past Surgical History:  Procedure Laterality Date   CYSTOSCOPY     LEFT HEART CATH AND CORONARY ANGIOGRAPHY Left 12/20/2022   Procedure: LEFT HEART CATH AND CORONARY ANGIOGRAPHY;  Surgeon: Yvonne Kendall, MD;  Location: [facility] INVASIVE CV LAB;  Service: Cardiovascular;  Laterality: Left;   Active Ambulatory Problems    Diagnosis Date Noted   Hypertensive emergency 04/07/2022   Hypertension    HLD (hyperlipidemia) 04/07/2022   Obesity, Class III, BMI 40-49.9 (morbid obesity) (HCC)    Hypokalemia    Elevated troponin    Chest pain    Cough    OSA (obstructive sleep apnea) 04/07/2022   Chronic systolic heart failure (HCC) 12/20/2022   Lumbar spondylosis 07/10/2023   Lumbar degenerative disc disease 07/10/2023   Bilateral primary osteoarthritis of knee 07/10/2023   Chronic pain syndrome 07/10/2023    Resolved Ambulatory Problems    Diagnosis Date Noted   No Resolved Ambulatory Problems   Past Medical History:  Diagnosis Date   NICM (nonischemic cardiomyopathy) (HCC)    Constitutional Exam  General appearance: Well nourished, well developed, and well hydrated. In no apparent acute distress Vitals:   07/10/23 1305  BP: 133/84  Pulse: 72  Resp: 18  Temp: (!) 96.6 F (35.9 C)  TempSrc: Temporal  SpO2: 100%  Weight: 253 lb 14.4 oz (115.2 kg)  Height: 5\' 11"  (1.803 m)   BMI  Assessment: Estimated body mass index is 35.41 kg/m as calculated from the following:   Height as of this encounter: 5\' 11"  (1.803 m).   Weight as of this encounter: 253 lb 14.4 oz (115.2 kg).  BMI interpretation table: BMI level Category Range association with higher incidence of chronic pain  <18 kg/m2 Underweight   18.5-24.9 kg/m2 Ideal body weight   25-29.9 kg/m2 Overweight Increased incidence by 20%  30-34.9 kg/m2 Obese (Class I) Increased incidence by 68%  35-39.9 kg/m2 Severe obesity (Class II) Increased incidence by 136%  >40 kg/m2 Extreme obesity (Class III) Increased incidence by 254%   Patient's current BMI Ideal Body weight  Body mass index is 35.41 kg/m. Ideal body weight: 75.3 kg (166 lb 0.1 oz) Adjusted ideal body weight: 91.2 kg (201 lb 2.6 oz)   BMI Readings from Last 4 Encounters:  07/10/23 35.41 kg/m  07/03/23 49.04 kg/m  06/06/23 49.12 kg/m  03/07/23 50.13 kg/m   Wt Readings from Last 4 Encounters:  07/10/23 253 lb 14.4 oz (115.2 kg)  07/03/23 (!) 351 lb 9.6 oz (159.5 kg)  06/06/23 (!) 352 lb 3.2 oz (159.8 kg)  03/07/23 (!) 359 lb 6.4 oz (163 kg)    Psych/Mental status: Alert, oriented x 3 (person, place, & time)       Eyes: PERLA Respiratory: No evidence of acute respiratory distress  Thoracic Spine Area Exam  Skin & Axial Inspection: No masses, redness, or swelling Alignment: Symmetrical Functional ROM: Unrestricted ROM Stability: No instability  detected Muscle Tone/Strength: Functionally intact. No obvious neuro-muscular anomalies detected. Sensory (Neurological): Unimpaired Muscle strength & Tone: No palpable anomalies Lumbar Spine Area Exam  Skin & Axial Inspection: No masses, redness, or swelling Alignment: Symmetrical Functional ROM: Pain restricted ROM affecting both sides Stability: No instability detected Muscle Tone/Strength: Functionally intact. No obvious neuro-muscular anomalies detected. Sensory (Neurological): Musculoskeletal pain pattern Palpation: No palpable anomalies       Provocative Tests: Hyperextension/rotation test: (+) bilaterally for facet joint pain. Lumbar quadrant test (Kemp's test): (+) bilaterally for facet joint pain.  Gait & Posture Assessment  Ambulation: Unassisted Gait: Antalgic gait (limping) Posture: Difficulty standing up straight, due to pain  Lower Extremity Exam    Side: Right lower extremity  Side: Left lower extremity  Stability: No instability observed          Stability: No instability observed          Skin & Extremity Inspection: Skin color, temperature, and hair growth are WNL. No peripheral edema or cyanosis. No masses, redness, swelling, asymmetry, or associated skin lesions. No contractures.  Skin & Extremity Inspection: Skin color, temperature, and hair growth are WNL. No peripheral edema or cyanosis. No masses, redness, swelling, asymmetry, or associated skin lesions. No contractures.  Functional ROM: Pain restricted ROM for knee joint          Functional ROM: Pain restricted ROM for knee joint          Muscle Tone/Strength: Functionally intact. No obvious neuro-muscular anomalies detected.  Muscle Tone/Strength: Functionally intact. No obvious neuro-muscular anomalies detected.  Sensory (Neurological): Arthropathic arthralgia        Sensory (Neurological): Arthropathic arthralgia        DTR: Patellar: deferred today Achilles: deferred today Plantar: deferred today   DTR: Patellar: deferred today Achilles: deferred today Plantar: deferred today  Palpation: No palpable anomalies  Palpation: No palpable anomalies    Assessment  Primary Diagnosis & Pertinent Problem List: The primary encounter diagnosis was Lumbar facet arthropathy. Diagnoses  of Lumbar spondylosis, Lumbar degenerative disc disease, Bilateral primary osteoarthritis of knee, and Chronic pain syndrome were also pertinent to this visit.  Visit Diagnosis (New problems to examiner): 1. Lumbar facet arthropathy   2. Lumbar spondylosis   3. Lumbar degenerative disc disease   4. Bilateral primary osteoarthritis of knee   5. Chronic pain syndrome    Plan of Care (Initial workup plan)   1. Lumbar facet arthropathy - DG Lumbar Spine Complete W/Bend; Future - Ambulatory referral to Physical Therapy  2. Lumbar spondylosis - DG Lumbar Spine Complete W/Bend; Future - Ambulatory referral to Physical Therapy  3. Lumbar degenerative disc disease - DG Lumbar Spine Complete W/Bend; Future - Ambulatory referral to Physical Therapy  4. Bilateral primary osteoarthritis of knee - DG Lumbar Spine Complete W/Bend; Future - DG Knee Complete 4 Views Left; Future - DG Knee Complete 4 Views Right; Future - Ambulatory referral to Physical Therapy  5. Chronic pain syndrome - DG Lumbar Spine Complete W/Bend; Future - DG Knee Complete 4 Views Left; Future - DG Knee Complete 4 Views Right; Future - Ambulatory referral to Physical Therapy    Imaging Orders         DG Lumbar Spine Complete W/Bend         DG Knee Complete 4 Views Left         DG Knee Complete 4 Views Right     Referral Orders         Ambulatory referral to Physical Therapy      Analgesic Pharmacotherapy:  Opioid Analgesics: For patients currently taking or requesting to take opioid analgesics, in accordance with [facility] Guidelines, we will assess their risks and indications for the use of these substances.  After completing our evaluation, we may offer recommendations, but we no longer take patients for medication management. The prescribing physician will ultimately decide, based on his/her training and level of comfort whether to adopt any of the recommendations, including whether or not to prescribe such medicines.  Membrane stabilizer: To be determined at a later time  Muscle relaxant: To be determined at a later time  NSAID: To be determined at a later time  Other analgesic(s): To be determined at a later time   Interventional management options: Mr. Reggio was informed that there is no guarantee that he would be a candidate for interventional therapies. The decision will be based on the results of diagnostic studies, as well as Mr. Reetz risk profile.  Procedure(s) under consideration:  Lumbar medial branch nerve blocks, RFA, PNS Intra-articular knee steroid, gel injections Genicular nerve block     Provider-requested follow-up: Return in about 4 weeks (around 08/07/2023) for 2nd pt visit F2F.  Future Appointments  Date Time Provider Department Center  07/23/2023  8:00 AM [facility]  08/07/2023  3:00 PM Edward Jolly, MD [facility] None  08/08/2023  9:00 AM Delma Freeze, FNP [facility] None  09/08/2023  1:40 PM Agbor-Etang, Arlys John, MD [facility] None    Duration of encounter: .  Total time on encounter, as per AMA guidelines included both the face-to-face and non-face-to-face time personally spent by the physician and/or other qualified health care professional(s) on the day of the encounter (includes time in activities that require the physician or other qualified health care professional and does not include time in activities normally performed by clinical staff). Physician's time may include the following activities when performed: Preparing to see the patient (e.g., pre-charting review of records, searching for  previously ordered imaging, lab work, and nerve  conduction tests) Review of prior analgesic pharmacotherapies. Reviewing PMP Interpreting ordered tests (e.g., lab work, imaging, nerve conduction tests) Performing post-procedure evaluations, including interpretation of diagnostic procedures Obtaining and/or reviewing separately obtained history Performing a medically appropriate examination and/or evaluation Counseling and educating the patient/family/caregiver Ordering medications, tests, or procedures Referring and communicating with other health care professionals (when not separately reported) Documenting clinical information in the electronic or other health record Independently interpreting results (not separately reported) and communicating results to the patient/ family/caregiver Care coordination (not separately reported)  Note by: Edward Jolly, MD (TTS technology used. I apologize for any typographical errors that were not detected and corrected.) Date: 07/10/2023; Time: 2:13 PM

## 2023-07-10 NOTE — Progress Notes (Signed)
Safety precautions to be maintained throughout the outpatient stay will include: orient to surroundings, keep bed in low position, maintain call bell within reach at all times, provide assistance with transfer out of bed and ambulation.  

## 2023-07-10 NOTE — Patient Instructions (Signed)
You have been referred to physical therapy.  You will have xrays completed prior to next appt. With pain clinic.

## 2023-07-11 ENCOUNTER — Encounter: Payer: Self-pay | Admitting: Physician Assistant

## 2023-07-22 ENCOUNTER — Telehealth (HOSPITAL_COMMUNITY): Payer: Self-pay | Admitting: *Deleted

## 2023-07-22 NOTE — Telephone Encounter (Signed)
Attempted to call patient regarding upcoming cardiac MRI appointment. Left message on voicemail with name and callback number Johney Frame RN Navigator Cardiac Imaging [facility] [phone] Office

## 2023-07-23 ENCOUNTER — Other Ambulatory Visit: Payer: Self-pay | Admitting: Cardiology

## 2023-07-23 ENCOUNTER — Ambulatory Visit
Admission: RE | Admit: 2023-07-23 | Discharge: 2023-07-23 | Disposition: A | Discharge status: Home / Self Care | Payer: 59 | Source: Ambulatory Visit | Attending: Cardiology | Admitting: Cardiology

## 2023-07-23 DIAGNOSIS — I428 Other cardiomyopathies: Secondary | ICD-10-CM | POA: Insufficient documentation

## 2023-07-23 MED ORDER — GADOBUTROL 1 MMOL/ML IV SOLN
14.0000 mL | Freq: Once | INTRAVENOUS | Status: AC | PRN
  Administered 2023-07-23: 7.5 mL via INTRAVENOUS

## 2023-07-29 LAB — LAB REPORT - SCANNED: EGFR: 58

## 2023-08-06 ENCOUNTER — Other Ambulatory Visit: Payer: Self-pay | Admitting: Physician Assistant

## 2023-08-07 ENCOUNTER — Ambulatory Visit: Payer: PRIVATE HEALTH INSURANCE | Admitting: Student in an Organized Health Care Education/Training Program

## 2023-08-07 ENCOUNTER — Other Ambulatory Visit: Payer: Self-pay

## 2023-08-07 MED ORDER — SPIRONOLACTONE 25 MG PO TABS: 25.0000 mg | ORAL_TABLET | Freq: Every day | ORAL | 0 refills | Status: DC

## 2023-08-07 NOTE — Progress Notes (Deleted)
Advanced Heart Failure Clinic Note   Referring Physician: Reather Littler, NP [facility] cardiology) PCP: [name], MD Cardiologist: Debbe Odea, MD (last seen 06/24)  HPI:  William Jimenez is a 57 y/o male with a history of nonobstructive CAD by LHC in 12/2022, HFrEF secondary to NICM, frequent PVCs, HTN, HLD, prediabetse, obesity, chronic back pain, OSA and chronic heart failure.    First evaluated by Dr. Azucena Cecil on 11/04/22 for shortness of breath and abdominal distension. He was started on Lasix 40mg  daily and his edema improved. He then noted abdominal bloating, continued shortness of breath, orthopnea and hypertension. He was started on Coreg 12.5 mg BID, spironolactone 25mg  daily and Lasix was increased to 40mg  BID.   On 12/20/22 he underwent left heart catheterization that indicated mild-moderate single vessel coronary artery disease with 30-40% mid LAD lesion, LVEDP was severely elevated at 35-3mmHg, findings were consistent with non-ischemic cardiomyopathy. Following his LHC he was given IV lasix followed by titration of oral furosemide to 80mg  twice daily and lisinopril was increased to 20mg  daily.   Holter monitor worn 02/04/23: Patient had a min HR of 49 bpm, max HR of 119 bpm, and avg HR of 73 bpm. Predominant underlying rhythm was Sinus Rhythm. No Isolated SVEs, SVE Couplets, or SVE Triplets were present. Isolated VEs were frequent (6.0%, 23231), VE Couplets were occasional (1.2%, 2290), and no VE Triplets were present. Ventricular Bigeminy was present. Conclusion: Frequent PVCs, 6% burden. No other significant arrhythmias.  Was in the ED 09/01/22 due to worsening shortness of breath on exertion and orthopnea x 2 weeks. IV diuresed. Elevated troponin due to demand ischemia in the setting of suspected CHF exacerbation and not ACS.   Echo 09/02/22: EF 30-35% with mild/ moderate pulmonary HTN Echo 03/28/23: EF 35-40% along with Grade II DD, aortic valve thickening without evidence of  narrowing  LHC 12/20/22: Mild-moderate single vessel coronary artery disease with 30-40% mid LAD lesion; question focal myocardial bridging.  No significant disease observed in the LCx or RCA.  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 Severely elevated left heart filling pressures (LVEDP 35-40 mmHg).  cMRI 07/23/23: 1. Moderately dilated LV, moderate to severely reduced systolic function. LVEF 32%.  2.  Small midwall basal inferolateral scar  3.  No evidence for infiltrative disease. 4.  Normal RV size and function.  5.  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  He presents today for a HF f/u visit with a chief complaint of   At last visit, lisinopril was stopped and entresto started and furosemide also decreased to once daily with additional dose PRN.  ROS: All systems negative except as listed in HPI, PMH and Problem List.  SH:  Social History   Socioeconomic History   Marital status: Married    Spouse name: Not on file   Number of children: Not on file   Years of education: Not on file   Highest education level: Not on file  Occupational History   Not on file  Tobacco Use   Smoking status: Never   Smokeless tobacco: Current    Types: Snuff  Vaping Use   Vaping status: Never Used  Substance and Sexual Activity   Alcohol use: No   Drug use: No   Sexual activity: Yes    Birth control/protection: None  Other Topics Concern   Not on file  Social History Narrative   Not on file   Social Determinants of Health   Financial Resource Strain: Low Risk  (09/02/2022)  Received from [facility], [facility]   Overall Financial Resource Strain (CARDIA)    Difficulty of Paying Living Expenses: Not hard at all  Food Insecurity: No Food Insecurity (09/02/2022)   Received from [facility], [facility]   Hunger Vital Sign    Worried About Running Out of Food in the Last Year: Never true    Ran Out of Food in the Last Year: Never  true  Transportation Needs: No Transportation Needs (09/02/2022)   Received from [facility], [facility]   [facility] - Transportation    Lack of Transportation (Medical): No    Lack of Transportation (Non-Medical): No  Physical Activity: Not on file  Stress: Not on file  Social Connections: Not on file  Intimate Partner Violence: Not on file    FH:  Family History  Problem Relation Age of Onset   Prostate cancer Neg Hx    Bladder Cancer Neg Hx    Kidney cancer Neg Hx     Past Medical History:  Diagnosis Date   HLD (hyperlipidemia)    Hypertension    NICM (nonischemic cardiomyopathy) (HCC)     Current Outpatient Medications  Medication Sig Dispense Refill   acetaminophen (TYLENOL) 500 MG tablet Take 500 mg by mouth as needed.     albuterol (VENTOLIN HFA) 108 (90 Base) MCG/ACT inhaler Inhale 2 puffs into the lungs every 6 (six) hours as needed for wheezing or shortness of breath. 8 g 2   atorvastatin (LIPITOR) 20 MG tablet Take 1 tablet (20 mg total) by mouth daily. 90 tablet 3   carvedilol (COREG) 25 MG tablet TAKE 1 & 1/2 (ONE & ONE-HALF) TABLETS BY MOUTH TWICE DAILY WITH A MEAL 270 tablet 0   furosemide (LASIX) 40 MG tablet Take 1 tablet (40 mg total) by mouth daily. Take additional 40 mg (1 tablet) only as needed. 30 tablet 3   sacubitril-valsartan (ENTRESTO) 24-26 MG Take 1 tablet by mouth 2 (two) times daily. 60 tablet 6   sertraline (ZOLOFT) 50 MG tablet Take 1 tablet (50 mg total) by mouth daily.     spironolactone (ALDACTONE) 25 MG tablet Take 1 tablet (25 mg total) by mouth daily. 30 tablet 0   No current facility-administered medications for this visit.      PHYSICAL EXAM:  General:  Well appearing. No resp difficulty HEENT: normal Neck: supple. JVP flat.  No lymphadenopathy or thryomegaly appreciated. Cor: PMI normal. Regular rate & rhythm. No rubs, gallops or murmurs. Lungs: clear Abdomen: soft, nontender, nondistended. No hepatosplenomegaly. No  bruits or masses.  Extremities: no cyanosis, clubbing, rash, edema Neuro: alert & orientedx3, cranial nerves grossly intact. Moves all 4 extremities w/o difficulty. Affect pleasant.   ECG:   ASSESSMENT & PLAN:  1: Chronic NICM with reduced ejection fraction- - suspect due to sleep apnea/ HTN as cath showed nonobstructive CAD - NYHA class II - euvolemic - not weighing but has scales; instructed to call for overnight weight gain of > 2 pounds or a weekly weight gain of > 5 pounds - weight  - Echo 09/02/22: EF 30-35% with mild/ moderate pulmonary HTN - Echo 03/28/23: EF 35-40% along with Grade II DD, aortic valve thickening without evidence of narrowing - has cMRI scheduled for 07/23/23 - not adding salt to his food - continue carvedilol 37.5mg  BID - continue furosemide 40mg  every day w/ additional dose PRN - continue entresto 24/26mg  BID - continue spironolactone 25mg  daily - BMP today -  continue to hold hydralazine 10mg  TID/ isosorbide MN 15mg  daily as he developed chest pain after taking 1 dose but he doesn't know which med caused it as he only took 1 dose and took both of the meds at the same time; if we restart these, will start 1 at a time to see which caused the chest pain - BNP 07/03/23 was 23.5  2: HTN- - BP  - saw PCP Marvis Moeller) @ [facility]  - BMP 07/03/23 showed sodium 133, potassium 4.3, creatinine 1.50 & GFR 55  3: Nonobstructive CAD- - LHC 12/20/22:   Mild-moderate single vessel coronary artery disease with 30-40% mid LAD lesion; question focal myocardial bridging.  No significant disease observed in the LCx or RCA.  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   Severely elevated left heart filling pressures (LVEDP 35-40 mmHg). - saw cardiology Shelva Majestic) 06/24 - continue atorvastatin 20mg  daily  4: Sleep apnea- - wearing CPAP nightly and says that he's sleeping well

## 2023-08-08 ENCOUNTER — Encounter: Payer: PRIVATE HEALTH INSURANCE | Admitting: Family

## 2023-08-08 ENCOUNTER — Telehealth: Payer: Self-pay | Admitting: Family

## 2023-08-08 NOTE — Telephone Encounter (Signed)
Patient did not show for his Heart Failure Clinic appointment on 08/08/23.

## 2023-08-21 ENCOUNTER — Encounter: Payer: Self-pay | Admitting: Student in an Organized Health Care Education/Training Program

## 2023-08-21 ENCOUNTER — Ambulatory Visit (HOSPITAL_BASED_OUTPATIENT_CLINIC_OR_DEPARTMENT_OTHER): Payer: 59 | Admitting: Family

## 2023-08-21 ENCOUNTER — Encounter: Payer: Self-pay | Admitting: Family

## 2023-08-21 ENCOUNTER — Ambulatory Visit
Payer: 59 | Attending: Student in an Organized Health Care Education/Training Program | Admitting: Student in an Organized Health Care Education/Training Program

## 2023-08-21 VITALS — BP 122/81 | HR 78 | Resp 14 | Wt 350.0 lb

## 2023-08-21 VITALS — BP 127/91 | HR 70 | Temp 97.2°F | Ht 71.0 in | Wt 350.0 lb

## 2023-08-21 DIAGNOSIS — I10 Essential (primary) hypertension: Secondary | ICD-10-CM | POA: Insufficient documentation

## 2023-08-21 DIAGNOSIS — M5136 Other intervertebral disc degeneration, lumbar region: Secondary | ICD-10-CM | POA: Diagnosis not present

## 2023-08-21 DIAGNOSIS — I5022 Chronic systolic (congestive) heart failure: Secondary | ICD-10-CM | POA: Diagnosis not present

## 2023-08-21 DIAGNOSIS — M47816 Spondylosis without myelopathy or radiculopathy, lumbar region: Secondary | ICD-10-CM | POA: Diagnosis not present

## 2023-08-21 DIAGNOSIS — G4733 Obstructive sleep apnea (adult) (pediatric): Secondary | ICD-10-CM | POA: Diagnosis not present

## 2023-08-21 DIAGNOSIS — I2510 Atherosclerotic heart disease of native coronary artery without angina pectoris: Secondary | ICD-10-CM

## 2023-08-21 DIAGNOSIS — M1711 Unilateral primary osteoarthritis, right knee: Secondary | ICD-10-CM | POA: Diagnosis not present

## 2023-08-21 DIAGNOSIS — G894 Chronic pain syndrome: Secondary | ICD-10-CM | POA: Diagnosis not present

## 2023-08-21 MED ORDER — ENTRESTO 49-51 MG PO TABS: 1.0000 | ORAL_TABLET | Freq: Two times a day (BID) | ORAL | 5 refills | Status: DC

## 2023-08-21 MED ORDER — FARXIGA 10 MG PO TABS: 10.0000 mg | ORAL_TABLET | Freq: Every day | ORAL | 5 refills | Status: DC

## 2023-08-21 NOTE — Assessment & Plan Note (Signed)
William Jimenez has a history of greater than 3 months of moderate to severe pain which is resulted in functional impairment.  The patient has tried various conservative therapeutic options such as NSAIDs, Tylenol, muscle relaxants, physical therapy which was inadequately effective.  Patient's pain is predominantly axial with physical exam and xray findings suggestive of facet arthropathy. Lumbar facet medial branch nerve blocks were discussed with the patient.  Risks and benefits were reviewed.  Patient would like to proceed with bilateral L3, L4, L5 medial branch nerve block.

## 2023-08-21 NOTE — Assessment & Plan Note (Signed)
Moderate right knee osteoarthritis.  Offer the patient steroid therapy, viscosupplementation, genicular nerve block which he states he will think about.

## 2023-08-21 NOTE — Progress Notes (Signed)
Advanced Heart Failure Clinic Note   PCP: [name], MD (last seen 08/24) Cardiologist: Debbe Odea, MD (last seen 06/24)  HPI:  Mr William Jimenez is a 57 y/o male with a history of nonobstructive CAD by LHC in 12/2022, HFrEF secondary to NICM, frequent PVCs, HTN, HLD, prediabetse, obesity, chronic back pain, OSA and chronic heart failure.    First evaluated by Dr. Azucena Cecil on 11/04/22 for shortness of breath and abdominal distension. He was started on Lasix 40mg  daily and his edema improved. He then noted abdominal bloating, continued shortness of breath, orthopnea and hypertension. He was started on Coreg 12.5 mg BID, spironolactone 25mg  daily and Lasix was increased to 40mg  BID.   On 12/20/22 he underwent left heart catheterization that indicated mild-moderate single vessel coronary artery disease with 30-40% mid LAD lesion, LVEDP was severely elevated at 35-68mmHg, findings were consistent with non-ischemic cardiomyopathy. Following his LHC he was given IV lasix followed by titration of oral furosemide to 80mg  twice daily and lisinopril was increased to 20mg  daily.   Holter monitor worn 02/04/23: Patient had a min HR of 49 bpm, max HR of 119 bpm, and avg HR of 73 bpm. Predominant underlying rhythm was Sinus Rhythm. No Isolated SVEs, SVE Couplets, or SVE Triplets were present. Isolated VEs were frequent (6.0%, 23231), VE Couplets were occasional (1.2%, 2290), and no VE Triplets were present. Ventricular Bigeminy was present. Conclusion: Frequent PVCs, 6% burden. No other significant arrhythmias.  Was in the ED 09/01/22 due to worsening shortness of breath on exertion and orthopnea x 2 weeks. IV diuresed. Elevated troponin due to demand ischemia in the setting of suspected CHF exacerbation and not ACS.   Echo 09/02/22: EF 30-35% with mild/ moderate pulmonary HTN Echo 03/28/23: EF 35-40% along with Grade II DD, aortic valve thickening without evidence of narrowing  LHC 12/20/22: Mild-moderate  single vessel coronary artery disease with 30-40% mid LAD lesion; question focal myocardial bridging.  No significant disease observed in the LCx or RCA.  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 Severely elevated left heart filling pressures (LVEDP 35-40 mmHg).  cMRI 07/23/23: 1. Moderately dilated LV, moderate to severely reduced systolic function. LVEF 32%.  2.  Small midwall basal inferolateral scar  3.  No evidence for infiltrative disease. 4.  Normal RV size and function.  5.  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  He presents today for a HF f/u visit with a chief complaint of minimal shortness of breath with moderate exertion. Chronic in nature. Has associated fatigue and intermittent dizziness with sudden position changes along with this. Denies chest pain, cough, palpitations, abdominal distention, pedal edema, weight gain or difficulty sleeping.   At last visit, lisinopril was stopped and entresto started and furosemide also decreased to once daily with additional dose PRN. He says that he didn't have any issues with taking it but he ran out a week ago and couldn't afford to fill it.   Saw his PCP last month and says that he had lab work drawn.  ROS: All systems negative except as listed in HPI, PMH and Problem List.  SH:  Social History   Socioeconomic History   Marital status: Married    Spouse name: Not on file   Number of children: Not on file   Years of education: Not on file   Highest education level: Not on file  Occupational History   Not on file  Tobacco Use   Smoking status: Never   Smokeless tobacco: Current  Types: Snuff  Vaping Use   Vaping status: Never Used  Substance and Sexual Activity   Alcohol use: No   Drug use: No   Sexual activity: Yes    Birth control/protection: None  Other Topics Concern   Not on file  Social History Narrative   Not on file   Social Determinants of Health   Financial Resource  Strain: Low Risk  (09/02/2022)   Received from [facility], [facility]   Overall Financial Resource Strain (CARDIA)    Difficulty of Paying Living Expenses: Not hard at all  Food Insecurity: No Food Insecurity (09/02/2022)   Received from [facility], [facility]   Hunger Vital Sign    Worried About Running Out of Food in the Last Year: Never true    Ran Out of Food in the Last Year: Never true  Transportation Needs: No Transportation Needs (09/02/2022)   Received from [facility], [facility]   [facility] - Transportation    Lack of Transportation (Medical): No    Lack of Transportation (Non-Medical): No  Physical Activity: Not on file  Stress: Not on file  Social Connections: Not on file  Intimate Partner Violence: Not on file    FH:  Family History  Problem Relation Age of Onset   Prostate cancer Neg Hx    Bladder Cancer Neg Hx    Kidney cancer Neg Hx     Past Medical History:  Diagnosis Date   HLD (hyperlipidemia)    Hypertension    NICM (nonischemic cardiomyopathy) (HCC)     Current Outpatient Medications  Medication Sig Dispense Refill   acetaminophen (TYLENOL) 500 MG tablet Take 500 mg by mouth as needed.     albuterol (VENTOLIN HFA) 108 (90 Base) MCG/ACT inhaler Inhale 2 puffs into the lungs every 6 (six) hours as needed for wheezing or shortness of breath. 8 g 2   atorvastatin (LIPITOR) 20 MG tablet Take 1 tablet (20 mg total) by mouth daily. 90 tablet 3   carvedilol (COREG) 25 MG tablet TAKE 1 & 1/2 (ONE & ONE-HALF) TABLETS BY MOUTH TWICE DAILY WITH A MEAL 270 tablet 0   furosemide (LASIX) 40 MG tablet Take 1 tablet (40 mg total) by mouth daily. Take additional 40 mg (1 tablet) only as needed. 30 tablet 3   sacubitril-valsartan (ENTRESTO) 24-26 MG Take 1 tablet by mouth 2 (two) times daily. 60 tablet 6   sertraline (ZOLOFT) 50 MG tablet Take 1 tablet (50 mg total) by mouth daily.     spironolactone (ALDACTONE) 25 MG tablet Take 1 tablet (25 mg  total) by mouth daily. 30 tablet 0   No current facility-administered medications for this visit.   Vitals:   08/21/23 1315  BP: 122/81  Pulse: 78  Resp: 14  SpO2: 99%  Weight: (!) 350 lb (158.8 kg)   Wt Readings from Last 3 Encounters:  08/21/23 (!) 350 lb (158.8 kg)  07/10/23 253 lb 14.4 oz (115.2 kg)  07/03/23 (!) 351 lb 9.6 oz (159.5 kg)   Lab Results  Component Value Date   CREATININE 1.50 (H) 07/03/2023   CREATININE 1.24 01/10/2023   CREATININE 1.17 12/13/2022   PHYSICAL EXAM:  General:  Well appearing. No resp difficulty HEENT: normal Neck: supple. JVP flat.  No lymphadenopathy or thryomegaly appreciated. Cor: PMI normal. Regular rate & rhythm. No rubs, gallops or murmurs. Lungs: clear Abdomen: soft, nontender, nondistended. No hepatosplenomegaly. No bruits or masses.  Extremities: no cyanosis,  clubbing, rash, edema Neuro: alert & orientedx3, cranial nerves grossly intact. Moves all 4 extremities w/o difficulty. Affect pleasant.   ECG: not done   ASSESSMENT & PLAN:  1: Chronic NICM with reduced ejection fraction- - suspect due to sleep apnea/ HTN as cath showed nonobstructive CAD - NYHA class II - euvolemic - weighing daily; reminded to call for overnight weight gain of > 2 pounds or a weekly weight gain of > 5 pounds - weight down 1.6 pounds from last visit there 6 weeks ago - Echo 09/02/22: EF 30-35% with mild/ moderate pulmonary HTN - Echo 03/28/23: EF 35-40% along with Grade II DD, aortic valve thickening without evidence of narrowing - cMRI 07/23/23: 1. Moderately dilated LV, moderate to severely reduced systolic function. LVEF 32%.  2.  Small midwall basal inferolateral scar  3.  No evidence for infiltrative disease. 4.  Normal RV size and function.  5.  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 - not adding salt to his food - continue carvedilol 37.5mg  BID - continue furosemide 40mg  every day w/ additional dose PRN; may be able to  use just PRN after farxiga is started - increase entresto to 49/51mg  BID; commercial copay card provided today - continue spironolactone 25mg  daily - begin farxiga 10mg  daily; 30 day voucher provided today - BMP in 1 month - BNP 07/03/23 was 23.5  2: HTN- - BP 122/81 - saw PCP Marvis Moeller) @ [facility] last month - BMP 07/28/23 (PCP office) showed sodium 136, potassium 4.3, creatinine 1.42 & GFR 58  3: Nonobstructive CAD- - LHC 12/20/22:   Mild-moderate single vessel coronary artery disease with 30-40% mid LAD lesion; question focal myocardial bridging.  No significant disease observed in the LCx or RCA.  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   Severely elevated left heart filling pressures (LVEDP 35-40 mmHg). - saw cardiology Shelva Majestic) 06/24 - continue atorvastatin 20mg  daily; discuss increasing this at next visit due to elevated LDL - LDL 07/28/23 (PCP office) was 189  4: Sleep apnea- - wearing CPAP nightly and says that he's sleeping well  Return in 2 weeks to see PharmD for continue GDMT titration

## 2023-08-21 NOTE — Progress Notes (Signed)
Safety precautions to be maintained throughout the outpatient stay will include: orient to surroundings, keep bed in low position, maintain call bell within reach at all times, provide assistance with transfer out of bed and ambulation.  

## 2023-08-21 NOTE — Assessment & Plan Note (Signed)
Discussed lumbar stretching exercises

## 2023-08-21 NOTE — Progress Notes (Signed)
PROVIDER NOTE: Information contained herein reflects review and annotations entered in association with encounter. Interpretation of such information and data should be left to medically-trained personnel. Information provided to patient can be located elsewhere in the medical record under "Patient Instructions". Document created using STT-dictation technology, any transcriptional errors that may result from process are unintentional.    Patient: William Jimenez  Service Category: E/M  Provider: Edward Jolly, MD  DOB: [birth year]  DOS: 08/21/2023  Referring Provider: [name], MD  MRN: [id number]  Specialty: Interventional Pain Management  PCP: [name], MD  Type: Established Patient  Setting: Ambulatory outpatient    Location: Office  Delivery: Face-to-face     HPI  William Jimenez, a 57 y.o. year old male, is here today because of his Primary osteoarthritis of right knee [M17.11]. William Jimenez primary complain today is Back Pain (lower)  Pertinent problems: William Jimenez has Lumbar facet arthropathy; Lumbar degenerative disc disease; Bilateral primary osteoarthritis of knee; Chronic pain syndrome; and Primary osteoarthritis of right knee on their pertinent problem list. Pain Assessment: Severity of Chronic pain is reported as a 9 /10. Location: Back Left (hip)/pain radiaties down to his right buttock at times. Onset: More than a month ago. Quality: Lambert Mody, Shooting, Aching. Timing: Intermittent. Modifying factor(s): sitting down, meds. Vitals:  height is 5\' 11"  (1.803 m) and weight is 350 lb (158.8 kg) (abnormal). His temperature is 97.2 F (36.2 C) (abnormal). His blood pressure is 127/91 (abnormal) and his pulse is 70. His oxygen saturation is 96%.  BMI: Estimated body mass index is 48.82 kg/m as calculated from the following:   Height as of this encounter: 5\' 11"  (1.803 m).   Weight as of this encounter: 350 lb (158.8 kg). Last encounter: 07/10/2023. Last procedure: Visit date not  found.  Reason for encounter:  review xray results and discuss treatment plan .    ROS  Constitutional: Denies any fever or chills Gastrointestinal: No reported hemesis, hematochezia, vomiting, or acute GI distress Musculoskeletal:  +LBP Neurological: No reported episodes of acute onset apraxia, aphasia, dysarthria, agnosia, amnesia, paralysis, loss of coordination, or loss of consciousness  Medication Review  acetaminophen, albuterol, atorvastatin, carvedilol, dapagliflozin propanediol, exenatide, furosemide, sacubitril-valsartan, sertraline, and spironolactone  History Review  Allergy: William Jimenez has No Known Allergies. Drug: William Jimenez  reports no history of drug use. Alcohol:  reports no history of alcohol use. Tobacco:  reports that he has never smoked. His smokeless tobacco use includes snuff. Social: William Jimenez  reports that he has never smoked. His smokeless tobacco use includes snuff. He reports that he does not drink alcohol and does not use drugs. Medical:  has a past medical history of HLD (hyperlipidemia), Hypertension, and NICM (nonischemic cardiomyopathy) (HCC). Surgical: William Jimenez  has a past surgical history that includes Cystoscopy and LEFT HEART CATH AND CORONARY ANGIOGRAPHY (Left, 12/20/2022). Family: family history is not on file.  Laboratory Chemistry Profile   Renal Lab Results  Component Value Date   BUN 23 (H) 07/03/2023   CREATININE 1.50 (H) 07/03/2023   GFRAA >60 07/03/2013   GFRNONAA 55 (L) 07/03/2023    Hepatic Lab Results  Component Value Date   AST 30 12/13/2022   ALT 34 12/13/2022   ALBUMIN 3.7 12/13/2022   ALKPHOS 53 12/13/2022    Electrolytes Lab Results  Component Value Date   NA 133 (L) 07/03/2023   K 4.3 07/03/2023   CL 101 07/03/2023   CALCIUM 8.6 (L) 07/03/2023  MG 2.0 01/10/2023    Bone Lab Results  Component Value Date   TESTOFREE 7.4 04/28/2020   TESTOSTERONE 352 10/19/2021    Inflammation (CRP: Acute Phase) (ESR:  Chronic Phase) No results found for: "CRP", "ESRSEDRATE", "LATICACIDVEN"       Note: Above Lab results reviewed.  Recent Imaging Review   Narrative & Impression  CLINICAL DATA:  Right knee pain.   EXAM: RIGHT KNEE - COMPLETE 4+ VIEW   COMPARISON:  11/13/2018.   FINDINGS: No fracture   Mild tricompartmental joint space narrowing, most evident of the medial compartment. Marginal osteophytes also noted from all 3 compartments.   No joint effusion.   Soft tissues are unremarkable.   IMPRESSION: 1. Mild-to-moderate tricompartmental osteoarthritis, significantly progressed when compared to the prior radiographs.     Electronically Signed   By: Amie Portland M.D.   On: 07/18/2023 16:28   CLINICAL DATA:  Low back pain.   EXAM: LUMBAR SPINE - COMPLETE WITH BENDING VIEWS   COMPARISON:  10/09/2018.   FINDINGS: The no fracture or bone lesion.   Grade 1 anterolisthesis of L4 on L5, not apparent on the neutral view, measuring 9 mm on flexion and 6 mm on extension.   No other spondylolisthesis.   Mild loss of disc height at L3-L4 with moderate loss of disc height at L4-L5. Facet joint widening bilaterally at L4-L5 mild facet degenerative change bilaterally at L5-S1   Soft tissues are unremarkable.   IMPRESSION: 1. No fracture or acute finding. 2. Degenerative changes as detailed. 3. Grade 1 anterolisthesis of L4 on L5 with mild subluxation, increasing 3 mm in degree between extension and flexion     Electronically Signed   By: Amie Portland M.D.   On: 07/17/2023 12:29    MR CARDIAC VELOCITY FLOW MAP CLINICAL DATA:  Cardiomyopathy, evaluate EF, scar  EXAM: CARDIAC MRI  TECHNIQUE: The patient was scanned on a 1.5 Tesla Siemens magnet. A dedicated cardiac coil was used. Functional imaging was done using Fiesta sequences. 2,3, and 4 chamber views were done to assess for RWMA's. Modified Simpson's rule using a short axis stack was used to calculate an  ejection fraction on a dedicated work [employer], [employer]. The patient received 14 cc of Gadavist. After 10 minutes inversion recovery sequences were used to assess for infiltration and scar tissue. Velocity flow mapping performed in the ascending aorta and main pulmonary artery.  CONTRAST:  14 cc  of Gadavist  FINDINGS: 1. Moderately dilated LV, mild LV thickness, moderate-severely reduced systolic function (LVEF = 32%). There are no regional wall motion abnormalities.  There is a small, midwall, basal-inferolateral late gadolinium enhancement in the left ventricular myocardium.  LVEDV: 333 ml  LVESV: 228 ml  SV: 105 ml  CO: 5.8 L/min  Myocardial mass: 228g  Native LV T1 value (normal <107ms)  LV ECV 26.2% (normal <30%)  2. Normal right ventricular size, thickness and systolic function. there are no regional wall motion abnormalities.  3.  Normal left and right atrial size.  4. Normal size of the aortic root, ascending aorta and pulmonary artery.  5.  No significant valvular abnormalities.  6.  Normal pericardium.  No pericardial effusion.  IMPRESSION: 1. Moderately dilated LV, moderate to severely reduced systolic function. LVEF 32%.  2.  Small midwall basal inferolateral scar.  3.  No evidence for infiltrative disease.  4.  Normal RV size and function.  5.  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  Electronically  Signed   By: Debbe Odea M.D.   On: 07/23/2023 16:46 MR CARDIAC MORPHOLOGY W WO CONTRAST CLINICAL DATA:  Cardiomyopathy, evaluate EF, scar  EXAM: CARDIAC MRI  TECHNIQUE: The patient was scanned on a 1.5 Tesla Siemens magnet. A dedicated cardiac coil was used. Functional imaging was done using Fiesta sequences. 2,3, and 4 chamber views were done to assess for RWMA's. Modified Simpson's rule using a short axis stack was used to calculate an ejection fraction on a dedicated work Merchandiser, [employer]. The patient received 14 cc of Gadavist. After 10 minutes inversion recovery sequences were used to assess for infiltration and scar tissue. Velocity flow mapping performed in the ascending aorta and main pulmonary artery.  CONTRAST:  14 cc  of Gadavist  FINDINGS: 1. Moderately dilated LV, mild LV thickness, moderate-severely reduced systolic function (LVEF = 32%). There are no regional wall motion abnormalities.  There is a small, midwall, basal-inferolateral late gadolinium enhancement in the left ventricular myocardium.  LVEDV: 333 ml  LVESV: 228 ml  SV: 105 ml  CO: 5.8 L/min  Myocardial mass: 228g  Native LV T1 value (normal <105ms)  LV ECV 26.2% (normal <30%)  2. Normal right ventricular size, thickness and systolic function. there are no regional wall motion abnormalities.  3.  Normal left and right atrial size.  4. Normal size of the aortic root, ascending aorta and pulmonary artery.  5.  No significant valvular abnormalities.  6.  Normal pericardium.  No pericardial effusion.  IMPRESSION: 1. Moderately dilated LV, moderate to severely reduced systolic function. LVEF 32%.  2.  Small midwall basal inferolateral scar.  3.  No evidence for infiltrative disease.  4.  Normal RV size and function.  5.  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  Electronically Signed   By: Debbe Odea M.D.   On: 07/23/2023 16:46 Note: Reviewed        Physical Exam  General appearance: Well nourished, well developed, and well hydrated. In no apparent acute distress Mental status: Alert, oriented x 3 (person, place, & time)       Respiratory: No evidence of acute respiratory distress Eyes: PERLA Vitals: BP (!) 127/91   Pulse 70   Temp (!) 97.2 F (36.2 C)   Ht 5\' 11"  (1.803 m)   Wt (!) 350 lb (158.8 kg)   SpO2 96%   BMI 48.82 kg/m  BMI: Estimated body mass index is 48.82 kg/m as calculated from the  following:   Height as of this encounter: 5\' 11"  (1.803 m).   Weight as of this encounter: 350 lb (158.8 kg). Ideal: Ideal body weight: 75.3 kg (166 lb 0.1 oz) Adjusted ideal body weight: 108.7 kg (239 lb 9.7 oz)  Lumbar Spine Area Exam  Skin & Axial Inspection: No masses, redness, or swelling Alignment: Symmetrical Functional ROM: Pain restricted ROM affecting both sides Stability: No instability detected Muscle Tone/Strength: Functionally intact. No obvious neuro-muscular anomalies detected. Sensory (Neurological): Musculoskeletal pain pattern Palpation: No palpable anomalies       Provocative Tests: Hyperextension/rotation test: (+) bilaterally for facet joint pain. Lumbar quadrant test (Kemp's test): (+) bilaterally for facet joint pain.  Gait & Posture Assessment  Ambulation: Unassisted Gait: Relatively normal for age and body habitus Posture: WNL  Lower Extremity Exam    Side: Right lower extremity  Side: Left lower extremity  Stability: No instability observed          Stability: No instability observed  Skin & Extremity Inspection: Skin color, temperature, and hair growth are WNL. No peripheral edema or cyanosis. No masses, redness, swelling, asymmetry, or associated skin lesions. No contractures.  Skin & Extremity Inspection: Skin color, temperature, and hair growth are WNL. No peripheral edema or cyanosis. No masses, redness, swelling, asymmetry, or associated skin lesions. No contractures.  Functional ROM: Unrestricted ROM                  Functional ROM: Unrestricted ROM                  Muscle Tone/Strength: Functionally intact. No obvious neuro-muscular anomalies detected.  Muscle Tone/Strength: Functionally intact. No obvious neuro-muscular anomalies detected.  Sensory (Neurological): Arthropathic arthralgia        Sensory (Neurological): Unimpaired        DTR: Patellar: deferred today Achilles: deferred today Plantar: deferred today  DTR: Patellar: deferred  today Achilles: deferred today Plantar: deferred today  Palpation: No palpable anomalies  Palpation: No palpable anomalies    Assessment   Diagnosis Status  1. Primary osteoarthritis of right knee   2. Lumbar facet arthropathy   3. Lumbar spondylosis   4. Lumbar degenerative disc disease   5. Chronic pain syndrome    Controlled Controlled Controlled   Updated Problems: Problem  Primary Osteoarthritis of Right Knee  Lumbar Facet Arthropathy   Continued axial low back pain, worse with facet loading.   Lumbar Degenerative Disc Disease    Plan of Care  Problem-specific:  Lumbar facet arthropathy William Jimenez has a history of greater than 3 months of moderate to severe pain which is resulted in functional impairment.  The patient has tried various conservative therapeutic options such as NSAIDs, Tylenol, muscle relaxants, physical therapy which was inadequately effective.  Patient's pain is predominantly axial with physical exam and xray findings suggestive of facet arthropathy. Lumbar facet medial branch nerve blocks were discussed with the patient.  Risks and benefits were reviewed.  Patient would like to proceed with bilateral L3, L4, L5 medial branch nerve block.   Lumbar degenerative disc disease Discussed lumbar stretching exercises  Primary osteoarthritis of right knee Moderate right knee osteoarthritis.  Offer the patient steroid therapy, viscosupplementation, genicular nerve block which he states he will think about.  William Jimenez has a current medication list which includes the following long-term medication(s): albuterol, atorvastatin, byetta 10 mcg pen, carvedilol, furosemide, sertraline, and spironolactone.  Pharmacotherapy (Medications Ordered): No orders of the defined types were placed in this encounter.  Orders:  Orders Placed This Encounter  Procedures   LUMBAR FACET(MEDIAL BRANCH NERVE BLOCK) MBNB    Standing Status:   Future    Standing  Expiration Date:   11/20/2023    Scheduling Instructions:     Procedure: Lumbar facet block (AKA.: Lumbosacral medial branch nerve block)     Side: Bilateral     Level:  L3-4, L4-5,  Facets (L3, L4, L5, Medial Branch)     Sedation: PO Valium     Timeframe: ASAA    Order Specific Question:   Where will this procedure be performed?    Answer:   [facility]   Follow-up plan:   Return in about 20 days (around 09/10/2023) for B/L L3, 4, 5 MBNB #1, in clinic (PO Valium).      Recent Visits Date Type Provider Dept  07/10/23 Office Visit Edward Jolly, MD [facility]  Showing recent visits within past 90 days and meeting all other  requirements Today's Visits Date Type Provider Dept  08/21/23 Office Visit Edward Jolly, MD [facility]  Showing today's visits and meeting all other requirements Future Appointments No visits were found meeting these conditions. Showing future appointments within next 90 days and meeting all other requirements  I discussed the assessment and treatment plan with the patient. The patient was provided an opportunity to ask questions and all were answered. The patient agreed with the plan and demonstrated an understanding of the instructions.  Patient advised to call back or seek an in-person evaluation if the symptoms or condition worsens.  Duration of encounter: .  Total time on encounter, as per AMA guidelines included both the face-to-face and non-face-to-face time personally spent by the physician and/or other qualified health care professional(s) on the day of the encounter (includes time in activities that require the physician or other qualified health care professional and does not include time in activities normally performed by clinical staff). Physician's time may include the following activities when performed: Preparing to see the patient (e.g., pre-charting review of records, searching for previously ordered imaging,  lab work, and nerve conduction tests) Review of prior analgesic pharmacotherapies. Reviewing PMP Interpreting ordered tests (e.g., lab work, imaging, nerve conduction tests) Performing post-procedure evaluations, including interpretation of diagnostic procedures Obtaining and/or reviewing separately obtained history Performing a medically appropriate examination and/or evaluation Counseling and educating the patient/family/caregiver Ordering medications, tests, or procedures Referring and communicating with other health care professionals (when not separately reported) Documenting clinical information in the electronic or other health record Independently interpreting results (not separately reported) and communicating results to the patient/ family/caregiver Care coordination (not separately reported)  Note by: Edward Jolly, MD Date: 08/21/2023; Time: 2:55 PM

## 2023-08-21 NOTE — Patient Instructions (Signed)
Medication Changes:  Start Entresto 49/51 mg (1 tablet) 2 times a day.   Start Farxiga 10 mg (1 tablet) daily.   Special Instructions // Education:  Do the following things EVERYDAY: Weigh yourself in the morning before breakfast. Write it down and keep it in a log. Take your medicines as prescribed Eat low salt foods--Limit salt (sodium) to 2000 mg per day.  Stay as active as you can everyday Limit all fluids for the day to less than 2 liters   Follow-Up in:  with our pharmacist Nason in 2 weeks.     If you have any questions or concerns before your next appointment please send [place] a message through [place] or call our office at [phone] Monday-Friday 8 am-5 pm.   If you have an urgent need after hours on the weekend please call your Primary Cardiologist or the [facility] in [place] at [phone].

## 2023-08-22 ENCOUNTER — Other Ambulatory Visit (HOSPITAL_COMMUNITY): Payer: Self-pay

## 2023-09-01 ENCOUNTER — Ambulatory Visit
Admission: RE | Admit: 2023-09-01 | Discharge: 2023-09-01 | Disposition: A | Discharge status: Home / Self Care | Payer: 59 | Source: Ambulatory Visit | Attending: Student in an Organized Health Care Education/Training Program | Admitting: Student in an Organized Health Care Education/Training Program

## 2023-09-01 ENCOUNTER — Ambulatory Visit
Payer: 59 | Attending: Student in an Organized Health Care Education/Training Program | Admitting: Student in an Organized Health Care Education/Training Program

## 2023-09-01 ENCOUNTER — Encounter: Payer: Self-pay | Admitting: Student in an Organized Health Care Education/Training Program

## 2023-09-01 DIAGNOSIS — M47816 Spondylosis without myelopathy or radiculopathy, lumbar region: Secondary | ICD-10-CM | POA: Insufficient documentation

## 2023-09-01 MED ORDER — DIAZEPAM 5 MG PO TABS
10.0000 mg | ORAL_TABLET | Freq: Once | ORAL | Status: AC
  Administered 2023-09-01: 10 mg via ORAL

## 2023-09-01 MED ORDER — ROPIVACAINE HCL 2 MG/ML IJ SOLN
9.0000 mL | Freq: Once | INTRAMUSCULAR | Status: AC
  Administered 2023-09-01: 9 mL via PERINEURAL

## 2023-09-01 MED ORDER — ROPIVACAINE HCL 2 MG/ML IJ SOLN
INTRAMUSCULAR | Status: AC
  Filled 2023-09-01: qty 20

## 2023-09-01 MED ORDER — DEXAMETHASONE SODIUM PHOSPHATE 10 MG/ML IJ SOLN
10.0000 mg | Freq: Once | INTRAMUSCULAR | Status: AC
  Administered 2023-09-01: 10 mg

## 2023-09-01 MED ORDER — DIAZEPAM 5 MG PO TABS
ORAL_TABLET | ORAL | Status: AC
  Filled 2023-09-01: qty 2

## 2023-09-01 MED ORDER — LIDOCAINE HCL 2 % IJ SOLN
INTRAMUSCULAR | Status: AC
  Filled 2023-09-01: qty 20

## 2023-09-01 MED ORDER — DEXAMETHASONE SODIUM PHOSPHATE 10 MG/ML IJ SOLN
INTRAMUSCULAR | Status: AC
  Filled 2023-09-01: qty 2

## 2023-09-01 MED ORDER — LIDOCAINE HCL 2 % IJ SOLN
20.0000 mL | Freq: Once | INTRAMUSCULAR | Status: AC
  Administered 2023-09-01: 400 mg

## 2023-09-01 NOTE — Progress Notes (Signed)
PROVIDER NOTE: Interpretation of information contained herein should be left to medically-trained personnel. Specific patient instructions are provided elsewhere under "Patient Instructions" section of medical record. This document was created in part using STT-dictation technology, any transcriptional errors that may result from this process are unintentional.  Patient: William Jimenez Type: Established DOB: 01-12-1966 MRN: [id number] PCP: [name], MD  Service: Procedure DOS: 09/01/2023 Setting: Ambulatory Location: Ambulatory outpatient facility Delivery: Face-to-face Provider: Edward Jolly, MD Specialty: Interventional Pain Management Specialty designation: 09 Location: Outpatient facility Ref. Prov.: Edward Jolly, MD       Interventional Therapy   Procedure: Lumbar Facet, Medial Branch Block(s) #1  Laterality: Bilateral  Level: L3, L4, and L5 Medial Branch Level(s). Injecting these levels blocks the L3-4 and L4-5 lumbar facet joints. Imaging: Fluoroscopic guidance         Anesthesia: Local anesthesia (1-2% Lidocaine) Sedation: Minimal Sedation DOS: 09/01/2023 Performed by: Edward Jolly, MD  Primary Purpose: Diagnostic/Therapeutic Indications: Low back pain severe enough to impact quality of life or function. 1. Lumbar facet arthropathy   2. Lumbar spondylosis    NAS-11 Pain score:   Pre-procedure: 2 /10   Post-procedure: 0-No pain/10     Position / Prep / Materials:  Position: Prone  Prep solution: DuraPrep (Iodine Povacrylex [0.7% available iodine] and Isopropyl Alcohol, 74% w/w) Area Prepped: Posterolateral Lumbosacral Spine (Wide prep: From the lower border of the scapula down to the end of the tailbone and from flank to flank.)  Materials:  Tray: Block Needle(s):  Type: Spinal  Gauge (G): 22  Length: 5-in Qty: 3      H&P (Pre-op Assessment):  William Jimenez is a 57 y.o. (year old), male patient, seen today for interventional treatment. He  has a past surgical  history that includes Cystoscopy and LEFT HEART CATH AND CORONARY ANGIOGRAPHY (Left, 12/20/2022). William Jimenez has a current medication list which includes the following prescription(s): acetaminophen, albuterol, atorvastatin, byetta 10 mcg pen, carvedilol, farxiga, furosemide, entresto, sertraline, and spironolactone. His primarily concern today is the Back Pain (lower)  Initial Vital Signs:  Pulse/HCG Rate: 68ECG Heart Rate: 68 Temp: (!) 97.1 F (36.2 C) Resp: 19 BP: (!) 147/98 SpO2: 98 %  BMI: Estimated body mass index is 48.12 kg/m as calculated from the following:   Height as of this encounter: 5\' 11"  (1.803 m).   Weight as of this encounter: 345 lb (156.5 kg).  Risk Assessment: Allergies: Reviewed. He has No Known Allergies.  Allergy Precautions: None required Coagulopathies: Reviewed. None identified.  Blood-thinner therapy: None at this time Active Infection(s): Reviewed. None identified. William Jimenez is afebrile  Site Confirmation: William Jimenez was asked to confirm the procedure and laterality before marking the site Procedure checklist: Completed Consent: Before the procedure and under the influence of no sedative(s), amnesic(s), or anxiolytics, the patient was informed of the treatment options, risks and possible complications. To fulfill our ethical and legal obligations, as recommended by the American Medical Association's Code of Ethics, I have informed the patient of my clinical impression; the nature and purpose of the treatment or procedure; the risks, benefits, and possible complications of the intervention; the alternatives, including doing nothing; the risk(s) and benefit(s) of the alternative treatment(s) or procedure(s); and the risk(s) and benefit(s) of doing nothing. The patient was provided information about the general risks and possible complications associated with the procedure. These may include, but are not limited to: failure to achieve desired goals, infection,  bleeding, organ or nerve damage, allergic reactions, paralysis, and  death. In addition, the patient was informed of those risks and complications associated to Spine-related procedures, such as failure to decrease pain; infection (i.e.: Meningitis, epidural or intraspinal abscess); bleeding (i.e.: epidural hematoma, subarachnoid hemorrhage, or any other type of intraspinal or peri-dural bleeding); organ or nerve damage (i.e.: Any type of peripheral nerve, nerve root, or spinal cord injury) with subsequent damage to sensory, motor, and/or autonomic systems, resulting in permanent pain, numbness, and/or weakness of one or several areas of the body; allergic reactions; (i.e.: anaphylactic reaction); and/or death. Furthermore, the patient was informed of those risks and complications associated with the medications. These include, but are not limited to: allergic reactions (i.e.: anaphylactic or anaphylactoid reaction(s)); adrenal axis suppression; blood sugar elevation that in diabetics may result in ketoacidosis or comma; water retention that in patients with history of congestive heart failure may result in shortness of breath, pulmonary edema, and decompensation with resultant heart failure; weight gain; swelling or edema; medication-induced neural toxicity; particulate matter embolism and blood vessel occlusion with resultant organ, and/or nervous system infarction; and/or aseptic necrosis of one or more joints. Finally, the patient was informed that Medicine is not an exact science; therefore, there is also the possibility of unforeseen or unpredictable risks and/or possible complications that may result in a catastrophic outcome. The patient indicated having understood very clearly. We have given the patient no guarantees and we have made no promises. Enough time was given to the patient to ask questions, all of which were answered to the patient's satisfaction. William Jimenez has indicated that he wanted to  continue with the procedure. Attestation: I, the ordering provider, attest that I have discussed with the patient the benefits, risks, side-effects, alternatives, likelihood of achieving goals, and potential problems during recovery for the procedure that I have provided informed consent. Date  Time: 09/01/2023  8:42 AM   Pre-Procedure Preparation:  Monitoring: As per clinic protocol. Respiration, ETCO2, SpO2, BP, heart rate and rhythm monitor placed and checked for adequate function Safety Precautions: Patient was assessed for positional comfort and pressure points before starting the procedure. Time-out: I initiated and conducted the "Time-out" before starting the procedure, as per protocol. The patient was asked to participate by confirming the accuracy of the "Time Out" information. Verification of the correct person, site, and procedure were performed and confirmed by me, the nursing staff, and the patient. "Time-out" conducted as per Joint Commission's Universal Protocol (UP.01.01.01). Time: 0930 Start Time: 0930 hrs.  Description of Procedure:          Laterality: (see above) Targeted Levels: (see above)  Safety Precautions: Aspiration looking for blood return was conducted prior to all injections. At no point did we inject any substances, as a needle was being advanced. Before injecting, the patient was told to immediately notify me if he was experiencing any new onset of "ringing in the ears, or metallic taste in the mouth". No attempts were made at seeking any paresthesias. Safe injection practices and needle disposal techniques used. Medications properly checked for expiration dates. SDV (single dose vial) medications used. After the completion of the procedure, all disposable equipment used was discarded in the proper designated medical waste containers. Local Anesthesia: Protocol guidelines were followed. The patient was positioned over the fluoroscopy table. The area was prepped in the  usual manner. The time-out was completed. The target area was identified using fluoroscopy. A 12-in long, straight, sterile hemostat was used with fluoroscopic guidance to locate the targets for each level blocked. Once located, the  skin was marked with an approved surgical skin marker. Once all sites were marked, the skin (epidermis, dermis, and hypodermis), as well as deeper tissues (fat, connective tissue and muscle) were infiltrated with a small amount of a short-acting local anesthetic, loaded on a 10cc syringe with a 25G, 1.5-in  Needle. An appropriate amount of time was allowed for local anesthetics to take effect before proceeding to the next step. Local Anesthetic: Lidocaine 2.0% The unused portion of the local anesthetic was discarded in the proper designated containers. Technical description of process:   L3 Medial Branch Nerve Block (MBB): The target area for the L3 medial branch is at the junction of the postero-lateral aspect of the superior articular process and the superior, posterior, and medial edge of the transverse process of L4. Under fluoroscopic guidance, a Quincke needle was inserted until contact was made with os over the superior postero-lateral aspect of the pedicular shadow (target area). After negative aspiration for blood, 2mL of the nerve block solution was injected without difficulty or complication. The needle was removed intact. L4 Medial Branch Nerve Block (MBB): The target area for the L4 medial branch is at the junction of the postero-lateral aspect of the superior articular process and the superior, posterior, and medial edge of the transverse process of L5. Under fluoroscopic guidance, a Quincke needle was inserted until contact was made with os over the superior postero-lateral aspect of the pedicular shadow (target area). After negative aspiration for blood,50mL of the nerve block solution was injected without difficulty or complication. The needle was removed intact. L5  Medial Branch Nerve Block (MBB): The target area for the L5 medial branch is at the junction of the postero-lateral aspect of the superior articular process and the superior, posterior, and medial edge of the sacral ala. Under fluoroscopic guidance, a Quincke needle was inserted until contact was made with os over the superior postero-lateral aspect of the pedicular shadow (target area). After negative aspiration for blood, 2mL of the nerve block solution was injected without difficulty or complication. The needle was removed intact.   Once the entire procedure was completed, the treated area was cleaned, making sure to leave some of the prepping solution back to take advantage of its long term bactericidal properties.         Illustration of the posterior view of the lumbar spine and the posterior neural structures. Laminae of L2 through S1 are labeled. DPRL5, dorsal primary ramus of L5; DPRS1, dorsal primary ramus of S1; DPR3, dorsal primary ramus of L3; FJ, facet (zygapophyseal) joint L3-L4; I, inferior articular process of L4; LB1, lateral branch of dorsal primary ramus of L1; IAB, inferior articular branches from L3 medial branch (supplies L4-L5 facet joint); IBP, intermediate branch plexus; MB3, medial branch of dorsal primary ramus of L3; NR3, third lumbar nerve root; S, superior articular process of L5; SAB, superior articular branches from L4 (supplies L4-5 facet joint also); TP3, transverse process of L3.   Facet Joint Innervation (* possible contribution)  L1-2 T12, L1 (L2*)  Medial Branch  L2-3 L1, L2 (L3*)         "          "  L3-4 L2, L3 (L4*)         "          "  L4-5 L3, L4 (L5*)         "          "  L5-S1 L4, L5, S1          "          "  Vitals:   09/01/23 0929 09/01/23 0934 09/01/23 0939 09/01/23 0944  BP: (!) 145/107 (!) 172/112 (!) 150/101 137/83  Pulse:      Resp: 18 19 19 16   Temp:      SpO2: 98% 100% 99% 98%  Weight:      Height:         End Time: 0943  hrs.  Imaging Guidance (Spinal):          Type of Imaging Technique: Fluoroscopy Guidance (Spinal) Indication(s): Assistance in needle guidance and placement for procedures requiring needle placement in or near specific anatomical locations not easily accessible without such assistance. Exposure Time: Please see nurses notes. Contrast: None used. Fluoroscopic Guidance: I was personally present during the use of fluoroscopy. "Tunnel Vision Technique" used to obtain the best possible view of the target area. Parallax error corrected before commencing the procedure. "Direction-depth-direction" technique used to introduce the needle under continuous pulsed fluoroscopy. Once target was reached, antero-posterior, oblique, and lateral fluoroscopic projection used confirm needle placement in all planes. Images permanently stored in EMR. Interpretation: No contrast injected. I personally interpreted the imaging intraoperatively. Adequate needle placement confirmed in multiple planes. Permanent images saved into the patient's record.  Post-operative Assessment:  Post-procedure Vital Signs:  Pulse/HCG Rate: 6878 Temp: (!) 97.1 F (36.2 C) Resp: 16 BP: 137/83 SpO2: 98 %  EBL: None  Complications: No immediate post-treatment complications observed by team, or reported by patient.  Note: The patient tolerated the entire procedure well. A repeat set of vitals were taken after the procedure and the patient was kept under observation following institutional policy, for this type of procedure. Post-procedural neurological assessment was performed, showing return to baseline, prior to discharge. The patient was provided with post-procedure discharge instructions, including a section on how to identify potential problems. Should any problems arise concerning this procedure, the patient was given instructions to immediately contact us, at any time, without hesitation. In any case, we plan to contact the patient by  telephone for a follow-up status report regarding this interventional procedure.  Comments:  No additional relevant information.  Plan of Care (POC)  Orders:  Orders Placed This Encounter  Procedures   DG PAIN [facility] C-ARM 1-60 MIN NO REPORT    Intraoperative interpretation by procedural physician at [facility].    Standing Status:   Standing    Number of Occurrences:   1    Order Specific Question:   Reason for exam:    Answer:   Assistance in needle guidance and placement for procedures requiring needle placement in or near specific anatomical locations not easily accessible without such assistance.    Medications ordered for procedure: Meds ordered this encounter  Medications   lidocaine (XYLOCAINE) 2 % (with pres) injection 400 mg   dexamethasone (DECADRON) injection 10 mg   dexamethasone (DECADRON) injection 10 mg   ropivacaine (PF) 2 mg/mL (0.2%) (NAROPIN) injection 9 mL   ropivacaine (PF) 2 mg/mL (0.2%) (NAROPIN) injection 9 mL   diazepam (VALIUM) tablet 10 mg    Make sure Flumazenil is available in the pyxis when using this medication. If oversedation occurs, administer 0.2 mg IV over 15 sec. If after 45 sec no response, administer 0.2 mg again over 1 min; may repeat at 1 min intervals; not to exceed 4 doses (1 mg)   Medications administered: We administered lidocaine, dexamethasone, dexamethasone, ropivacaine (PF) 2 mg/mL (0.2%), ropivacaine (PF) 2 mg/mL (0.2%), and diazepam.  See the medical record for exact dosing, route,  and time of administration.  Follow-up plan:   Return in about 3 weeks (around 09/22/2023) for PPE, F2F.       Recent Visits Date Type Provider Dept  08/21/23 Office Visit Edward Jolly, MD [facility]  07/10/23 Office Visit Edward Jolly, MD [facility]  Showing recent visits within past 90 days and meeting all other requirements Today's Visits Date Type Provider Dept  09/01/23 Procedure visit Edward Jolly, MD  [facility]  Showing today's visits and meeting all other requirements Future Appointments Date Type Provider Dept  09/24/23 Appointment Edward Jolly, MD [facility]  Showing future appointments within next 90 days and meeting all other requirements  Disposition: Discharge home  Discharge (Date  Time): 09/01/2023; 0952 hrs.   Primary Care Physician: [name], MD Location: [facility] Note by: Edward Jolly, MD (TTS technology used. I apologize for any typographical errors that were not detected and corrected.) Date: 09/01/2023; Time: 10:48 AM  Disclaimer:  Medicine is not an Visual merchandiser. The only guarantee in medicine is that nothing is guaranteed. It is important to note that the decision to proceed with this intervention was based on the information collected from the patient. The Data and conclusions were drawn from the patient's questionnaire, the interview, and the physical examination. Because the information was provided in large part by the patient, it cannot be guaranteed that it has not been purposely or unconsciously manipulated. Every effort has been made to obtain as much relevant data as possible for this evaluation. It is important to note that the conclusions that lead to this procedure are derived in large part from the available data. Always take into account that the treatment will also be dependent on availability of resources and existing treatment guidelines, considered by other Pain Management Practitioners as being common knowledge and practice, at the time of the intervention. For Medico-Legal purposes, it is also important to point out that variation in procedural techniques and pharmacological choices are the acceptable norm. The indications, contraindications, technique, and results of the above procedure should only be interpreted and judged by a Board-Certified Interventional Pain Specialist with extensive  familiarity and expertise in the same exact procedure and technique.

## 2023-09-01 NOTE — Patient Instructions (Signed)

## 2023-09-01 NOTE — Progress Notes (Signed)
Safety precautions to be maintained throughout the outpatient stay will include: orient to surroundings, keep bed in low position, maintain call bell within reach at all times, provide assistance with transfer out of bed and ambulation.  

## 2023-09-02 ENCOUNTER — Telehealth: Payer: Self-pay

## 2023-09-02 ENCOUNTER — Telehealth: Payer: Self-pay | Admitting: *Deleted

## 2023-09-02 NOTE — Telephone Encounter (Signed)
Post procedure call;  patient reports that he is doing well.

## 2023-09-02 NOTE — Telephone Encounter (Signed)
Post procedure follow up.  LM 

## 2023-09-04 ENCOUNTER — Encounter: Payer: PRIVATE HEALTH INSURANCE | Admitting: Pharmacist

## 2023-09-04 NOTE — Progress Notes (Deleted)
Advanced Heart Failure Clinic Note  PCP: [name], MD  PCP-Cardiologist: Debbe Odea, MD  HPI:  William Jimenez is a 57 y/o male with a history of nonobstructive CAD by LHC in 12/2022, HFrEF secondary to NICM, frequent PVCs, HTN, HLD, prediabetse, obesity, chronic back pain, OSA and chronic heart failure.     First evaluated by Dr. Azucena Cecil on 11/04/22 for shortness of breath and abdominal distension. He was started on Lasix 40mg  daily and his edema improved. He then noted abdominal bloating, continued shortness of breath, orthopnea and hypertension. He was started on Coreg 12.5 mg BID, spironolactone 25mg  daily and Lasix was increased to 40mg  BID.    On 12/20/22 he underwent left heart catheterization that indicated mild-moderate single vessel coronary artery disease with 30-40% mid LAD lesion, LVEDP was severely elevated at 35-35mmHg, findings were consistent with non-ischemic cardiomyopathy. Following his LHC he was given IV lasix followed by titration of oral furosemide to 80mg  twice daily and lisinopril was increased to 20mg  daily.    Holter monitor worn 02/04/23: Patient had a min HR of 49 bpm, max HR of 119 bpm, and avg HR of 73 bpm. Predominant underlying rhythm was Sinus Rhythm. No Isolated SVEs, SVE Couplets, or SVE Triplets were present. Isolated VEs were frequent (6.0%, 23231), VE Couplets were occasional (1.2%, 2290), and no VE Triplets were present. Ventricular Bigeminy was present. Conclusion: Frequent PVCs, 6% burden. No other significant arrhythmias.   Was in the ED 09/01/22 due to worsening shortness of breath on exertion and orthopnea x 2 weeks. IV diuresed. Elevated troponin due to demand ischemia in the setting of suspected CHF exacerbation and not ACS.    Echo 09/02/22: EF 30-35% with mild/ moderate pulmonary HTN Echo 03/28/23: EF 35-40% along with Grade II DD, aortic valve thickening without evidence of narrowing   LHC 12/20/22: Mild-moderate single vessel coronary  artery disease with 30-40% mid LAD lesion; question focal myocardial bridging.  No significant disease observed in the LCx or RCA.  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 Severely elevated left heart filling pressures (LVEDP 35-40 mmHg).   cMRI 07/23/23: 1. Moderately dilated LV, moderate to severely reduced systolic function. LVEF 32%.  2.  Small midwall basal inferolateral scar  3.  No evidence for infiltrative disease. 4.  Normal RV size and function.  5.  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   Seen by Clarisa Kindred on 07/03/23 where lisinopril was stopped and entresto started and furosemide also decreased to once daily with additional dose PRN.  Seen by Clarisa Kindred on 08/21/23. Complained of shortness of breath with exertion,  fatigue and intermittent dizziness with sudden position changes along with this. He says that he didn't have any issues with taking Entresto but he ran out and couldn't afford to fill it. Marcelline Deist was added and Sherryll Burger was increased to 49-51 mg BID.   Today William Jimenez returns to [facility] for pharmacist medication titration. Reports feeling ***. {Reports/Denies:210917258}. {ACTIONS;DENIES/REPORTS:21021675::"Denies"} being able to complete all activities of daily living (ADLs). Is *** active throughout the day. Weight at home is *** pounds. Takes {CHL AMB AHFC Medications:210917260} ***. Appetite ***. {Does Follow/Does Not Follow:210917261} a low sodium diet.  Current HF Medications: -Carvedilol 37.5 mg BID -Furosemide 40 mg daily -Entresto 49-51 mg BID -Spironolactone 25 mg daily -Farxiga 10 mg daily  Has the patient been experiencing any side effects to the medications prescribed? {yes/no:20286}  Does the patient have any problems obtaining medications due to transportation  or finances? {yes/no:20286}  Understanding of regimen: {CHL AMB [facility] Excellent/Good/Fair/Poor:210917262}  Understanding of indications:  {CHL AMB [facility] Excellent/Good/Fair/Poor:210917262}  Potential of adherence: {CHL AMB [facility] Excellent/Good/Fair/Poor:210917262}  Patient understands to avoid NSAIDs.  Patient understands to avoid decongestants.  Pertinent Lab Values: Creatinine  Date Value Ref Range Status  07/03/2013 1.21 0.60 - 1.30 mg/dL Final   Creatinine, Ser  Date Value Ref Range Status  07/03/2023 1.50 (H) 0.61 - 1.24 mg/dL Final   BUN  Date Value Ref Range Status  07/03/2023 23 (H) 6 - 20 mg/dL Final  16/09/9603 10 7 - 18 mg/dL Final   Potassium  Date Value Ref Range Status  07/03/2023 4.3 3.5 - 5.1 mmol/L Final  07/03/2013 3.5 3.5 - 5.1 mmol/L Final   Sodium  Date Value Ref Range Status  07/03/2023 133 (L) 135 - 145 mmol/L Final  07/03/2013 139 136 - 145 mmol/L Final   B Natriuretic Peptide  Date Value Ref Range Status  07/03/2023 23.5 0.0 - 100.0 pg/mL Final    Comment:    Performed at [facility], [address] [place], [place] [address]   Magnesium  Date Value Ref Range Status  01/10/2023 2.0 1.7 - 2.4 mg/dL Final    Comment:    Performed at [facility], [address], [place], [place] [address]    Vital Signs: There were no vitals filed for this visit.  Assessment/Plan: 1: Chronic NICM with reduced ejection fraction- - suspect due to sleep apnea/ HTN as cath showed nonobstructive CAD - NYHA class II - euvolemic - weighing daily; reminded to call for overnight weight gain of > 2 pounds or a weekly weight gain of > 5 pounds - weight down 1.6 pounds from last visit there 6 weeks ago - Echo 09/02/22: EF 30-35% with mild/ moderate pulmonary HTN - Echo 03/28/23: EF 35-40% along with Grade II DD, aortic valve thickening without evidence of narrowing - cMRI 07/23/23: 1. Moderately dilated LV, moderate to severely reduced systolic function. LVEF 32%.  2.  Small midwall basal inferolateral scar  3.  No evidence for infiltrative disease. 4.  Normal RV size and  function.  5.  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 - not adding salt to his food - continue carvedilol 37.5mg  BID - continue furosemide 40mg  every day w/ additional dose PRN; may be able to use just PRN after farxiga is started - increase entresto to 49/51mg  BID; commercial copay card provided today - continue spironolactone 25mg  daily - begin farxiga 10mg  daily; 30 day voucher provided today - BMP in 1 month - BNP 07/03/23 was 23.5   2: HTN- - BP 122/81 - saw PCP Marvis Moeller) @ [facility] last month - BMP 07/28/23 (PCP office) showed sodium 136, potassium 4.3, creatinine 1.42 & GFR 58   3: Nonobstructive CAD- - LHC 12/20/22:   Mild-moderate single vessel coronary artery disease with 30-40% mid LAD lesion; question focal myocardial bridging.  No significant disease observed in the LCx or RCA.  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   Severely elevated left heart filling pressures (LVEDP 35-40 mmHg). - saw cardiology Shelva Majestic) 06/24 - continue atorvastatin 20mg  daily; discuss increasing this at next visit due to elevated LDL - LDL 07/28/23 (PCP office) was 189   4: Sleep apnea- - wearing CPAP nightly and says that he's sleeping well Follow up: ***  ***

## 2023-09-05 NOTE — Progress Notes (Unsigned)
Advanced Heart Failure Clinic Note  PCP: [name], MD  PCP-Cardiologist: Debbe Odea, MD  HPI:  William Jimenez is a 57 y/o male with a history of nonobstructive CAD by LHC in 12/2022, HFrEF secondary to NICM, frequent PVCs, HTN, HLD, prediabetse, obesity, chronic back pain, OSA and chronic heart failure.     First evaluated by Dr. Azucena Cecil on 11/04/22 for shortness of breath and abdominal distension. He was started on Lasix 40mg  daily and his edema improved. He then noted abdominal bloating, continued shortness of breath, orthopnea and hypertension. He was started on Coreg 12.5 mg BID, spironolactone 25mg  daily and Lasix was increased to 40mg  BID.    On 12/20/22 he underwent left heart catheterization that indicated mild-moderate single vessel coronary artery disease with 30-40% mid LAD lesion, LVEDP was severely elevated at 35-64mmHg, findings were consistent with non-ischemic cardiomyopathy. Following his LHC he was given IV lasix followed by titration of oral furosemide to 80mg  twice daily and lisinopril was increased to 20mg  daily.    Holter monitor worn 02/04/23: Patient had a min HR of 49 bpm, max HR of 119 bpm, and avg HR of 73 bpm. Predominant underlying rhythm was Sinus Rhythm. No Isolated SVEs, SVE Couplets, or SVE Triplets were present. Isolated VEs were frequent (6.0%, 23231), VE Couplets were occasional (1.2%, 2290), and no VE Triplets were present. Ventricular Bigeminy was present. Conclusion: Frequent PVCs, 6% burden. No other significant arrhythmias.   Was in the ED 09/01/22 due to worsening shortness of breath on exertion and orthopnea x 2 weeks. IV diuresed. Elevated troponin due to demand ischemia in the setting of suspected CHF exacerbation and not ACS.    Echo 09/02/22: EF 30-35% with mild/ moderate pulmonary HTN Echo 03/28/23: EF 35-40% along with Grade II DD, aortic valve thickening without evidence of narrowing   LHC 12/20/22: Mild-moderate single vessel coronary  artery disease with 30-40% mid LAD lesion; question focal myocardial bridging.  No significant disease observed in the LCx or RCA.  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 Severely elevated left heart filling pressures (LVEDP 35-40 mmHg).   cMRI 07/23/23: 1. Moderately dilated LV, moderate to severely reduced systolic function. LVEF 32%.  2.  Small midwall basal inferolateral scar  3.  No evidence for infiltrative disease. 4.  Normal RV size and function.  5.  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   Seen by Clarisa Kindred on 07/03/23 where lisinopril was stopped and entresto started and furosemide also decreased to once daily with additional dose PRN.  Seen by Clarisa Kindred on 08/21/23. Complained of shortness of breath with exertion,  fatigue and intermittent dizziness with sudden position changes along with this. He says that he didn't have any issues with taking Entresto but he ran out and couldn't afford to fill it. Marcelline Deist was added and Sherryll Burger was increased to 49-51 mg BID.   Today William Jimenez returns to [facility] Failure Clinic for pharmacist medication titration. Reports feeling ***. {Reports/Denies:210917258}. {ACTIONS;DENIES/REPORTS:21021675::"Denies"} being able to complete all activities of daily living (ADLs). Is *** active throughout the day. Weight at home is *** pounds. Takes {CHL AMB AHFC Medications:210917260} ***. Appetite ***. {Does Follow/Does Not Follow:210917261} a low sodium diet.  Current HF Medications: -Carvedilol 37.5 mg BID -Furosemide 40 mg daily -Entresto 49-51 mg BID -Spironolactone 25 mg daily -Farxiga 10 mg daily  Has the patient been experiencing any side effects to the medications prescribed? {yes/no:20286}  Does the patient have any problems obtaining medications due to transportation  or finances? {yes/no:20286}  Understanding of regimen: {CHL AMB AHFC Excellent/Good/Fair/Poor:210917262}  Understanding of indications:  {CHL AMB AHFC Excellent/Good/Fair/Poor:210917262}  Potential of adherence: {CHL AMB AHFC Excellent/Good/Fair/Poor:210917262}  Patient understands to avoid NSAIDs.  Patient understands to avoid decongestants.  Pertinent Lab Values: Creatinine  Date Value Ref Range Status  07/03/2013 1.21 0.60 - 1.30 mg/dL Final   Creatinine, Ser  Date Value Ref Range Status  07/03/2023 1.50 (H) 0.61 - 1.24 mg/dL Final   BUN  Date Value Ref Range Status  07/03/2023 23 (H) 6 - 20 mg/dL Final  44/31/5400 10 7 - 18 mg/dL Final   Potassium  Date Value Ref Range Status  07/03/2023 4.3 3.5 - 5.1 mmol/L Final  07/03/2013 3.5 3.5 - 5.1 mmol/L Final   Sodium  Date Value Ref Range Status  07/03/2023 133 (L) 135 - 145 mmol/L Final  07/03/2013 139 136 - 145 mmol/L Final   B Natriuretic Peptide  Date Value Ref Range Status  07/03/2023 23.5 0.0 - 100.0 pg/mL Final    Comment:    Performed at [facility], [address], [place], [place] [address]   Magnesium  Date Value Ref Range Status  01/10/2023 2.0 1.7 - 2.4 mg/dL Final    Comment:    Performed at [facility], [address], [place], [place] [address]    Vital Signs: There were no vitals filed for this visit.  Assessment/Plan: 1: Chronic NICM with reduced ejection fraction- - suspect due to sleep apnea/ HTN as cath showed nonobstructive CAD - NYHA class II - euvolemic - weighing daily; reminded to call for overnight weight gain of > 2 pounds or a weekly weight gain of > 5 pounds - weight down 1.6 pounds from last visit there 6 weeks ago - Echo 09/02/22: EF 30-35% with mild/ moderate pulmonary HTN - Echo 03/28/23: EF 35-40% along with Grade II DD, aortic valve thickening without evidence of narrowing - cMRI 07/23/23: 1. Moderately dilated LV, moderate to severely reduced systolic function. LVEF 32%.  2.  Small midwall basal inferolateral scar  3.  No evidence for infiltrative disease. 4.  Normal RV size and  function.  5.  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 - not adding salt to his food - continue carvedilol 37.5mg  BID - continue furosemide 40mg  every day w/ additional dose PRN; may be able to use just PRN after farxiga is started - increase entresto to 49/51mg  BID; commercial copay card provided today - continue spironolactone 25mg  daily - begin farxiga 10mg  daily; 30 day voucher provided today - BMP in 1 month - BNP 07/03/23 was 23.5   2: HTN- - BP 122/81 - saw PCP Marvis Moeller) @ [facility] last month - BMP 07/28/23 (PCP office) showed sodium 136, potassium 4.3, creatinine 1.42 & GFR 58   3: Nonobstructive CAD- - LHC 12/20/22:   Mild-moderate single vessel coronary artery disease with 30-40% mid LAD lesion; question focal myocardial bridging.  No significant disease observed in the LCx or RCA.  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   Severely elevated left heart filling pressures (LVEDP 35-40 mmHg). - saw cardiology Shelva Majestic) 06/24 - continue atorvastatin 20mg  daily; discuss increasing this at next visit due to elevated LDL - LDL 07/28/23 (PCP office) was 189   4: Sleep apnea- - wearing CPAP nightly and says that he's sleeping well Follow up: ***  ***

## 2023-09-08 ENCOUNTER — Other Ambulatory Visit: Payer: Self-pay | Admitting: *Deleted

## 2023-09-08 ENCOUNTER — Encounter: Payer: Self-pay | Admitting: Cardiology

## 2023-09-08 ENCOUNTER — Ambulatory Visit: Payer: 59 | Attending: Cardiology | Admitting: Cardiology

## 2023-09-08 VITALS — BP 144/94 | HR 68 | Ht 71.0 in | Wt 346.4 lb

## 2023-09-08 DIAGNOSIS — I428 Other cardiomyopathies: Secondary | ICD-10-CM | POA: Diagnosis not present

## 2023-09-08 DIAGNOSIS — I10 Essential (primary) hypertension: Secondary | ICD-10-CM

## 2023-09-08 DIAGNOSIS — E7800 Pure hypercholesterolemia, unspecified: Secondary | ICD-10-CM | POA: Diagnosis not present

## 2023-09-08 MED ORDER — SACUBITRIL-VALSARTAN 97-103 MG PO TABS: 1.0000 | ORAL_TABLET | Freq: Two times a day (BID) | ORAL | 3 refills | Status: DC

## 2023-09-08 MED ORDER — CARVEDILOL 25 MG PO TABS: 25.0000 mg | ORAL_TABLET | Freq: Two times a day (BID) | ORAL | 0 refills | Status: DC

## 2023-09-08 MED ORDER — SPIRONOLACTONE 25 MG PO TABS: 25.0000 mg | ORAL_TABLET | Freq: Every day | ORAL | 0 refills | Status: DC

## 2023-09-08 NOTE — Progress Notes (Signed)
Cardiology Office Note:    Date:  09/08/2023   ID:  William Jimenez, DOB [birth year], MRN [id number]  PCP:  [name], MD   [facility] Providers Cardiologist:  Debbe Odea, MD     Referring MD: [name], MD   Chief Complaint  Patient presents with   Follow-up    Discuss cardiac testing results.  Patient denies new or acute cardiac problems/concerns today.  Not taking Marcelline Deist due to insurance no longer covering.      History of Present Illness:    William Jimenez is a 57 y.o. male with a hx of nonischemic cardiomyopathy EF 30 to 35%, hypertension, hyperlipidemia, OSA who presents for follow-up.  Had recent cardiac MR showing EF 32%, small mid wall inferior scar.  Takes Coreg 37.5 mg twice daily.  Stop taking Marcelline Deist due to cost issues, insurance will not cover.  Denies edema, feels well otherwise.  Compliant with medications as prescribed.  States BP is usually well-controlled.  Prior notes CMR 8/24 EF 32% Echo 4/24 EF 35 to 40% Left heart cath 1/24 mild mid LAD stenosis 35%   Past Medical History:  Diagnosis Date   HLD (hyperlipidemia)    Hypertension    NICM (nonischemic cardiomyopathy) (HCC)     Past Surgical History:  Procedure Laterality Date   CYSTOSCOPY     LEFT HEART CATH AND CORONARY ANGIOGRAPHY Left 12/20/2022   Procedure: LEFT HEART CATH AND CORONARY ANGIOGRAPHY;  Surgeon: Yvonne Kendall, MD;  Location: [facility] INVASIVE CV LAB;  Service: Cardiovascular;  Laterality: Left;    Current Medications: Current Meds  Medication Sig   acetaminophen (TYLENOL) 500 MG tablet Take 500 mg by mouth as needed.   albuterol (VENTOLIN HFA) 108 (90 Base) MCG/ACT inhaler Inhale 2 puffs into the lungs every 6 (six) hours as needed for wheezing or shortness of breath.   atorvastatin (LIPITOR) 20 MG tablet Take 1 tablet (20 mg total) by mouth daily.   BYETTA 10 MCG PEN 10 MCG/0.04ML SOPN injection Inject 10 mcg into the skin 2 (two) times daily with a meal.    furosemide (LASIX) 40 MG tablet Take 1 tablet (40 mg total) by mouth daily. Take additional 40 mg (1 tablet) only as needed.   sacubitril-valsartan (ENTRESTO) 97-103 MG Take 1 tablet by mouth 2 (two) times daily.   sertraline (ZOLOFT) 50 MG tablet Take 1 tablet (50 mg total) by mouth daily.   spironolactone (ALDACTONE) 25 MG tablet Take 1 tablet (25 mg total) by mouth daily.   [DISCONTINUED] carvedilol (COREG) 25 MG tablet TAKE 1 & 1/2 (ONE & ONE-HALF) TABLETS BY MOUTH TWICE DAILY WITH A MEAL   [DISCONTINUED] sacubitril-valsartan (ENTRESTO) 49-51 MG Take 1 tablet by mouth 2 (two) times daily.     Allergies:   Patient has no known allergies.   Social History   Socioeconomic History   Marital status: Married    Spouse name: Not on file   Number of children: Not on file   Years of education: Not on file   Highest education level: Not on file  Occupational History   Not on file  Tobacco Use   Smoking status: Never   Smokeless tobacco: Current    Types: Snuff  Vaping Use   Vaping status: Never Used  Substance and Sexual Activity   Alcohol use: No   Drug use: No   Sexual activity: Yes    Birth control/protection: None  Other Topics Concern   Not on file  Social History Narrative   Not on file   Social Determinants of Health   Financial Resource Strain: Low Risk  (09/02/2022)   Received from [facility], [facility]   Overall Financial Resource Strain (CARDIA)    Difficulty of Paying Living Expenses: Not hard at all  Food Insecurity: No Food Insecurity (09/02/2022)   Received from [facility], [facility]   Hunger Vital Sign    Worried About Running Out of Food in the Last Year: Never true    Ran Out of Food in the Last Year: Never true  Transportation Needs: No Transportation Needs (09/02/2022)   Received from [facility], [facility]   [facility] - Transportation    Lack of Transportation (Medical): No    Lack of Transportation (Non-Medical): No   Physical Activity: Not on file  Stress: Not on file  Social Connections: Not on file     Family History: The patient's family history is negative for Prostate cancer, Bladder Cancer, and Kidney cancer.  ROS:   Please see the history of present illness.     All other systems reviewed and are negative.  EKGs/Labs/Other Studies Reviewed:    The following studies were reviewed today:   EKG:  EKG is  ordered today.  The ekg ordered today demonstrates sinus rhythm, PVCs.  Recent Labs: 12/13/2022: ALT 34; Hemoglobin 14.8; Platelets 283 01/10/2023: Magnesium 2.0 07/03/2023: B Natriuretic Peptide 23.5; BUN 23; Creatinine, Ser 1.50; Potassium 4.3; Sodium 133  Recent Lipid Panel    Component Value Date/Time   CHOL 235 (H) 12/13/2022 1106   TRIG 80 12/13/2022 1106   HDL 55 12/13/2022 1106   CHOLHDL 4.3 12/13/2022 1106   VLDL 16 12/13/2022 1106   LDLCALC 164 (H) 12/13/2022 1106     Risk Assessment/Calculations:     HYPERTENSION CONTROL Vitals:   09/08/23 1327 09/08/23 1332  BP: (!) 142/90 (!) 144/94    The patient's blood pressure is elevated above target today.  In order to address the patient's elevated BP: A current anti-hypertensive medication was adjusted today.            Physical Exam:    VS:  BP (!) 144/94 (BP Location: Left Arm, Patient Position: Sitting, Cuff Size: Large)   Pulse 68   Ht 5\' 11"  (1.803 m)   Wt (!) 346 lb 6.4 oz (157.1 kg)   SpO2 98%   BMI 48.31 kg/m     Wt Readings from Last 3 Encounters:  09/08/23 (!) 346 lb 6.4 oz (157.1 kg)  09/01/23 (!) 345 lb (156.5 kg)  08/21/23 (!) 350 lb (158.8 kg)     GEN:  Well nourished, well developed in no acute distress HEENT: Normal NECK: No JVD; No carotid bruits CARDIAC: RRR, no murmurs, rubs, gallops RESPIRATORY: Diminished breath sounds at bases, no wheezing ABDOMEN: Soft, non-tender, abdominal distention MUSCULOSKELETAL:  No edema; No deformity  SKIN: Warm and dry NEUROLOGIC:  Alert and  oriented x 3 PSYCHIATRIC:  Normal affect   ASSESSMENT:    1. NICM (nonischemic cardiomyopathy) (HCC)   2. Primary hypertension   3. Pure hypercholesterolemia    PLAN:    In order of problems listed above:  Nonischemic cardiomyopathy, EF 35% etiology possibly uncontrolled hypertension.  Increase Entresto to 97-103 mg twice daily.  Reduce Coreg to 25 mg twice daily.  Continue Aldactone 25 mg daily.  Not on Farxiga due to cost issues/insurance coverage.  Continue Lasix 40 mg daily.  Repeat echo  in 3 months.  Also follows up with heart failure clinic. Hypertension BP elevated.  Increase Entresto as above.  Coreg 25 mg twice daily, Aldactone 25 mg daily. Hyperlipidemia, continue Lipitor 20 mg daily.  Obtain fasting lipid profile.  Follow-up after repeat echo in 3 months     Medication Adjustments/Labs and Tests Ordered: Current medicines are reviewed at length with the patient today.  Concerns regarding medicines are outlined above.  Orders Placed This Encounter  Procedures   Lipid panel   ECHOCARDIOGRAM COMPLETE   Meds ordered this encounter  Medications   carvedilol (COREG) 25 MG tablet    Sig: Take 1 tablet (25 mg total) by mouth 2 (two) times daily with a meal.    Dispense:  270 tablet    Refill:  0   sacubitril-valsartan (ENTRESTO) 97-103 MG    Sig: Take 1 tablet by mouth 2 (two) times daily.    Dispense:  60 tablet    Refill:  3    Patient Instructions  Medication Instructions:   .INCREASE Entresto - Take one tablet ( 97/103mg ) by mouth twice a day.  Carvedilol - Take one tablet ( 25mg ) by mouth twice a day.   *If you need a refill on your cardiac medications before your next appointment, please call your pharmacy*   Lab Work:  Your physician recommends you have labs - lipid  If you have labs (blood work) drawn today and your tests are completely normal, you will receive your results only by: MyChart Message (if you have MyChart) OR A paper copy in the mail If  you have any lab test that is abnormal or we need to change your treatment, we will call you to review the results.   Testing/Procedures:  Your physician has requested that you have an echocardiogram in 3 months. Echocardiography is a painless test that uses sound waves to create images of your heart. It provides your doctor with information about the size and shape of your heart and how well your heart's chambers and valves are working. This procedure takes approximately one hour. There are no restrictions for this procedure. Please do NOT wear cologne, perfume, aftershave, or lotions (deodorant is allowed). Please arrive 15 minutes prior to your appointment time.    Follow-Up: At [facility], you and your health needs are our priority.  As part of our continuing mission to provide you with exceptional heart care, we have created designated Provider Care Teams.  These Care Teams include your primary Cardiologist (physician) and Advanced Practice Providers (APPs -  Physician Assistants and Nurse Practitioners) who all work together to provide you with the care you need, when you need it.  We recommend signing up for the patient portal called "MyChart".  Sign up information is provided on this After Visit Summary.  MyChart is used to connect with patients for Virtual Visits (Telemedicine).  Patients are able to view lab/test results, encounter notes, upcoming appointments, etc.  Non-urgent messages can be sent to your provider as well.   To learn more about what you can do with MyChart, go to [email or web address].    Your next appointment:    After Echocardiogram  Provider:   You may see Debbe Odea, MD or one of the following Advanced Practice Providers on your designated Care Team:   Nicolasa Ducking, NP Eula Listen, PA-C Cadence Fransico Michael, PA-C Charlsie Quest, NP   Signed, Debbe Odea, MD  09/08/2023 2:50 PM    [facility]

## 2023-09-08 NOTE — Patient Instructions (Signed)
Medication Instructions:   .INCREASE Entresto - Take one tablet ( 97/103mg ) by mouth twice a day.  Carvedilol - Take one tablet ( 25mg ) by mouth twice a day.   *If you need a refill on your cardiac medications before your next appointment, please call your pharmacy*   Lab Work:  Your physician recommends you have labs - lipid  If you have labs (blood work) drawn today and your tests are completely normal, you will receive your results only by: MyChart Message (if you have MyChart) OR A paper copy in the mail If you have any lab test that is abnormal or we need to change your treatment, we will call you to review the results.   Testing/Procedures:  Your physician has requested that you have an echocardiogram in 3 months. Echocardiography is a painless test that uses sound waves to create images of your heart. It provides your doctor with information about the size and shape of your heart and how well your heart's chambers and valves are working. This procedure takes approximately one hour. There are no restrictions for this procedure. Please do NOT wear cologne, perfume, aftershave, or lotions (deodorant is allowed). Please arrive 15 minutes prior to your appointment time.    Follow-Up: At [facility], you and your health needs are our priority.  As part of our continuing mission to provide you with exceptional heart care, we have created designated Provider Care Teams.  These Care Teams include your primary Cardiologist (physician) and Advanced Practice Providers (APPs -  Physician Assistants and Nurse Practitioners) who all work together to provide you with the care you need, when you need it.  We recommend signing up for the patient portal called "MyChart".  Sign up information is provided on this After Visit Summary.  MyChart is used to connect with patients for Virtual Visits (Telemedicine).  Patients are able to view lab/test results, encounter notes, upcoming  appointments, etc.  Non-urgent messages can be sent to your provider as well.   To learn more about what you can do with MyChart, go to [email or web address].    Your next appointment:    After Echocardiogram  Provider:   You may see Debbe Odea, MD or one of the following Advanced Practice Providers on your designated Care Team:   Nicolasa Ducking, NP Eula Listen, PA-C Cadence Fransico Michael, PA-C Charlsie Quest, NP

## 2023-09-09 ENCOUNTER — Ambulatory Visit: Payer: 59 | Attending: Cardiology | Admitting: Pharmacist

## 2023-09-09 ENCOUNTER — Other Ambulatory Visit (HOSPITAL_COMMUNITY): Payer: Self-pay

## 2023-09-09 VITALS — BP 128/98 | HR 69 | Wt 347.6 lb

## 2023-09-09 DIAGNOSIS — I5022 Chronic systolic (congestive) heart failure: Secondary | ICD-10-CM

## 2023-09-09 LAB — LIPID PANEL
Chol/HDL Ratio: 3.2 ratio (ref 0.0–5.0)
Cholesterol, Total: 168 mg/dL (ref 100–199)
HDL: 52 mg/dL (ref >39)
LDL Chol Calc (NIH): 99 mg/dL (ref 0–99)
Triglycerides: 94 mg/dL (ref 0–149)
VLDL Cholesterol Cal: 17 mg/dL (ref 5–40)

## 2023-09-09 MED ORDER — EMPAGLIFLOZIN 10 MG PO TABS: 10.0000 mg | ORAL_TABLET | Freq: Every day | ORAL | 11 refills | Status: DC

## 2023-09-09 NOTE — Patient Instructions (Addendum)
It was a pleasure seeing you today!  MEDICATIONS: -We are changing your medications today -Start Jardiance 10 mg daily. Copay card provided. Reach out if you feel signs of dizziness, lightheadedness, shakiness, or other signs of low blood sugar. -Return for labs in 2 weeks. -Call if you have questions about your medications.  LABS: -We will call you if your labs need attention.  NEXT APPOINTMENT: Return to clinic in December with [facility].  In general, to take care of your heart failure: -Limit your fluid intake to 2 Liters (half-gallon) per day.   -Limit your salt intake to ideally 2-3 grams (2000-3000 mg) per day. -Weigh yourself daily and record, and bring that "weight diary" to your next appointment.  (Weight gain of 2-3 pounds in 1 day typically means fluid weight.) -The medications for your heart are to help your heart and help you live longer.   -Please contact us before stopping any of your heart medications.  Call the clinic at [phone] with questions or to reschedule future appointments.

## 2023-09-24 ENCOUNTER — Ambulatory Visit: Payer: 59 | Admitting: Student in an Organized Health Care Education/Training Program

## 2023-10-23 ENCOUNTER — Other Ambulatory Visit: Payer: Self-pay | Admitting: *Deleted

## 2023-10-23 MED ORDER — SPIRONOLACTONE 25 MG PO TABS: 25.0000 mg | ORAL_TABLET | Freq: Every day | ORAL | 0 refills | Status: DC

## 2023-11-18 ENCOUNTER — Other Ambulatory Visit: Payer: Self-pay | Admitting: Family

## 2023-11-24 ENCOUNTER — Telehealth: Payer: Self-pay | Admitting: Cardiology

## 2023-11-24 MED ORDER — SPIRONOLACTONE 25 MG PO TABS: 25.0000 mg | ORAL_TABLET | Freq: Every day | ORAL | 0 refills | Status: DC

## 2023-11-24 NOTE — Telephone Encounter (Signed)
*  STAT* If patient is at the pharmacy, call can be transferred to refill team.   1. Which medications need to be refilled? (please list name of each medication and dose if known)   spironolactone (ALDACTONE) 25 MG tablet   2. Would you like to learn more about the convenience, safety, & potential cost savings by using the [facility]?   3. Are you open to using the [facility] (Type [facility]. ).  4. Which pharmacy/location (including street and city if local pharmacy) is medication to be sent to?  [facility] - [place] ([address]), [place] - [address]   5. Do they need a 30 day or 90 day supply?   30 day  Patient stated he is completely out of this medication.

## 2023-11-24 NOTE — Telephone Encounter (Signed)
Requested Prescriptions   Signed Prescriptions Disp Refills   spironolactone (ALDACTONE) 25 MG tablet 90 tablet 0    Sig: Take 1 tablet (25 mg total) by mouth daily.    Authorizing Provider: Debbe Odea    Ordering User: Feliberto Harts L     last visit: 09/08/23 with plan to Follow-up in 3 months. Next visit: 12/12/23

## 2023-12-08 ENCOUNTER — Ambulatory Visit: Payer: Medicaid Other | Attending: Cardiology

## 2023-12-08 DIAGNOSIS — I428 Other cardiomyopathies: Secondary | ICD-10-CM | POA: Diagnosis not present

## 2023-12-08 LAB — ECHOCARDIOGRAM COMPLETE
Area-P 1/2: 3.03 cm2
S' Lateral: 4.6 cm

## 2023-12-08 MED ORDER — PERFLUTREN LIPID MICROSPHERE
1.0000 mL | INTRAVENOUS | Status: AC | PRN
  Administered 2023-12-08: 3 mL via INTRAVENOUS

## 2023-12-12 ENCOUNTER — Encounter: Payer: Self-pay | Admitting: Physician Assistant

## 2023-12-12 ENCOUNTER — Ambulatory Visit: Payer: Medicaid Other | Attending: Physician Assistant | Admitting: Emergency Medicine

## 2023-12-12 VITALS — BP 118/77 | HR 67 | Ht 70.0 in | Wt 339.6 lb

## 2023-12-12 DIAGNOSIS — I493 Ventricular premature depolarization: Secondary | ICD-10-CM | POA: Diagnosis not present

## 2023-12-12 DIAGNOSIS — I428 Other cardiomyopathies: Secondary | ICD-10-CM | POA: Diagnosis not present

## 2023-12-12 DIAGNOSIS — I10 Essential (primary) hypertension: Secondary | ICD-10-CM

## 2023-12-12 DIAGNOSIS — I2510 Atherosclerotic heart disease of native coronary artery without angina pectoris: Secondary | ICD-10-CM | POA: Diagnosis not present

## 2023-12-12 DIAGNOSIS — E785 Hyperlipidemia, unspecified: Secondary | ICD-10-CM

## 2023-12-12 DIAGNOSIS — G4733 Obstructive sleep apnea (adult) (pediatric): Secondary | ICD-10-CM

## 2023-12-12 DIAGNOSIS — E66813 Obesity, class 3: Secondary | ICD-10-CM

## 2023-12-12 DIAGNOSIS — Z6841 Body Mass Index (BMI) 40.0 and over, adult: Secondary | ICD-10-CM

## 2023-12-12 DIAGNOSIS — I5022 Chronic systolic (congestive) heart failure: Secondary | ICD-10-CM

## 2023-12-12 NOTE — Progress Notes (Signed)
Cardiology Office Note:    Date:  12/12/2023  ID:  [name], DOB [birth year], MRN [id number] PCP: [name], MD  [facility] Providers Cardiologist:  Debbe Odea, MD       Patient Profile:      William Jimenez is a 57 year old male with history of nonischemic cardiomyopathy, HTN, HLD, OSA.  He was seen in the ED on 09/01/2022 due to worsening shortness of breath on exertion and orthopnea x 2 weeks.  He was IV diuresed and elevated troponin due to demand ischemia in the setting of suspected CHF exacerbation.  Echocardiogram 09/02/2022 shows EF 30-35% with mild/moderate pulmonary hypertension.  He was first evaluated by cardiology on 11/04/2022 for shortness of breath and abdominal distention.  He was started on Lasix 40 mg daily and his edema improved.  On 12/20/2022 he underwent left heart catheterization that indicated mild-moderate single-vessel coronary artery disease with 30-40% mid LAD lesion, LVEDP was severely elevated at 35-40 mmHg, findings were consistent with nonischemic cardiomyopathy.  Following his LHC he was given IV Lasix followed by titration of oral furosemide to 80 mg twice daily and lisinopril was increased to 20 mg daily.  Holter monitor worn February 2024 showing minimal heart rate of 49, max heart rate of 119, average heart rate of 73 bpm.  Predominant underlying rhythm was sinus rhythm.  This showed frequent PVC burden at 6%.  No other significant arrhythmias.  Echocardiogram 03/28/2023 showing EF 35-40% along with grade 2 DD, aortic valve thickening without evidence of narrowing.  He underwent cardiac MRI showing EF 32%, moderately dilated LV,  small midwall inferolateral scar, no evidence for infiltrative disease, and findings consistent with nonischemic cardiomyopathy.  He was last seen in clinic on 09/08/2023, thought was nonischemic cardiomyopathy etiology possibly related to uncontrolled hypertension.  His Entresto was increased to 97-103 mg twice daily  and his Coreg was reduced to 25 mg twice daily.  His Aldactone was continued at 25 mg once daily.  He was seen by pharmacy on 09/09/2023 and started on Jardiance 10 mg daily.  Most recent echocardiogram 12/08/2023 shows EF of 40-45%, left ventricle demonstrates global hypokinesis, LV internal cavity size and mildly dilated, grade 1 DD, RV SF normal, left atrial size moderately dilated, no valvular abnormalities, mild dilation of aortic root measuring 41 mm.      History of Present Illness:   William Jimenez is a 57 y.o. male who returns for 57-month follow-up.  He arrives to clinic today by himself.  He reports feeling good and has had no cardiac complaints or concerns since the last time he was seen in clinic.  He noted significant improvement in his symptoms since starting GDMT.  He notices his shortness of breath as improved significantly and he no longer experiences leg swelling.  He is not currently active throughout the day and does not get much exercise.  He does walk his dogs daily.  He has no limitation of his current lifestyle and ADLs.  He notes he is no longer taking his GLP-1 Byetta.  He is currently following a low-sodium diet.  He is without exertional angina or DOE today.  He denies chest pain, shortness of breath, lower extremity edema, fatigue, palpitations, melena, hematuria, hemoptysis, diaphoresis, weakness, presyncope, syncope, orthopnea, and PND.       Review of Systems  Constitutional: Negative for weight gain and weight loss.  Cardiovascular:  Negative for chest pain, claudication, dyspnea on exertion, irregular heartbeat, leg swelling, near-syncope, orthopnea,  palpitations, paroxysmal nocturnal dyspnea and syncope.  Respiratory:  Positive for shortness of breath. Negative for cough and hemoptysis.   Gastrointestinal:  Negative for abdominal pain, hematochezia and melena.  Genitourinary:  Negative for hematuria.     See HPI    Studies Reviewed:   EKG  Interpretation Date/Time:  Friday December 12 2023 14:24:35 EST Ventricular Rate:  67 PR Interval:  196 QRS Duration:  94 QT Interval:  378 QTC Calculation: 399 R Axis:   -9  Text Interpretation: Sinus rhythm with occasional Premature ventricular complexes Confirmed by Rise Paganini [phone]) on 12/12/2023 4:28:35 PM    Echocardiogram 12/08/2023 1. Left ventricular ejection fraction, by estimation, is 40 to 45%. The  left ventricle has mildly decreased function. The left ventricle  demonstrates global hypokinesis. The left ventricular internal cavity size  was mildly dilated. Left ventricular  diastolic parameters are consistent with Grade I diastolic dysfunction  (impaired relaxation).   2. Right ventricular systolic function is normal. The right ventricular  size is normal. Tricuspid regurgitation signal is inadequate for assessing  PA pressure.   3. Left atrial size was moderately dilated.   4. The mitral valve is normal in structure. No evidence of mitral valve  regurgitation. No evidence of mitral stenosis.   5. The aortic valve has an indeterminant number of cusps. Aortic valve  regurgitation is not visualized. No aortic stenosis is present.   6. There is mild dilatation of the aortic root, measuring 41 mm.   7. The inferior vena cava is normal in size with greater than 50%  respiratory variability, suggesting right atrial pressure of 3 mmHg.   Cardiac MRI 07/23/2023 1. Moderately dilated LV, moderate to severely reduced systolic function. LVEF 32%.   2.  Small midwall basal inferolateral scar.   3.  No evidence for infiltrative disease.   4.  Normal RV size and function.   5.  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  ZIO 01/10/2023 Patient had a min HR of 49 bpm, max HR of 119 bpm, and avg HR of 73 bpm. Predominant underlying rhythm was Sinus Rhythm. No Isolated SVEs, SVE Couplets, or SVE Triplets were present. Isolated VEs were frequent (6.0%,  23231), VE Couplets were occasional  (1.2%, 2290), and no VE Triplets were present. Ventricular Bigeminy was present. Conclusion Frequent PVCs, 6% burden. No other significant arrhythmias.  Cardiac catheterization 12/20/2022 Conclusions: Mild-moderate single vessel coronary artery disease with 30-40% mid LAD lesion; question focal myocardial bridging.  No significant disease observed in the LCx or RCA.  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 Severely elevated left heart filling pressures (LVEDP 35-40 mmHg).   Recommendations: Administer furosemide 40 mg IV x 1 now and increase standing furosemide to 80 mg PO twice daily. Increase lisinopril to 20 mg daily. Continue current doses of carvedilol and spironolactone. Obtain BMP in 1 week. Consider outpatient consultation with advanced heart failure team. Diagnostic Dominance: Right    Risk Assessment/Calculations:             Physical Exam:   VS:  BP 118/77 (BP Location: Left Arm, Patient Position: Sitting, Cuff Size: Normal)   Pulse 67   Ht 5\' 10"  (1.778 m)   Wt (!) 339 lb 9.6 oz (154 kg)   SpO2 97%   BMI 48.73 kg/m    Wt Readings from Last 3 Encounters:  12/12/23 (!) 339 lb 9.6 oz (154 kg)  09/09/23 (!) 347 lb 9.6 oz (157.7 kg)  09/08/23 (!) 346 lb 6.4 oz (  157.1 kg)    Constitutional:      Appearance: Normal and healthy appearance.  Neck:     Vascular: JVD normal.  Pulmonary:     Effort: Pulmonary effort is normal.     Breath sounds: Normal breath sounds.  Chest:     Chest wall: Not tender to palpatation.  Cardiovascular:     PMI at left midclavicular line. Normal rate. Regular rhythm. Normal S1. Normal S2.      Murmurs: There is no murmur.     No gallop.  No click. No rub.  Pulses:    Intact distal pulses.  Edema:    Peripheral edema absent.  Musculoskeletal: Normal range of motion.     Cervical back: Normal range of motion and neck supple. Skin:    General: Skin is warm and dry.  Neurological:      General: No focal deficit present.     Mental Status: Alert and oriented to person, place and time.  Psychiatric:        Behavior: Behavior is cooperative.        Assessment and Plan:  HFmrEF / Nonischemic cardiomyopathy -Euvolemic and well compensated on exam, NYHA class II.  LVEF slightly improved to 40-45% via echocardiogram 11/2023 from LVEF 35-40% on 03/2023.  Cardiac MRI 07/2023 shows mildly dilated LV and no evidence of infiltrative disease with findings consistent with nonischemic dilated cardiomyopathy.  Cardiac cath showed nonobstructive CAD.  Etiology likely in setting of uncontrolled hypertension.  Symptoms vastly improved since initiation of GDMT.  He is currently on maximally tolerated GDMT, EF still moderately reduced but improving.  Will have him follow-up with his advanced heart failure team for further recommendations.  Continue GDMT carvedilol 25 mg twice daily, Jardiance 10 mg once daily, Lasix 40 mg once daily, Entresto 97-103 mg twice daily, spironolactone 25 mg once daily.  Plan to repeat BMP today, creatinine 1.45 / GFR 55 on 06/2023.   Nonobstructive CAD -Cardiac catheterization 1/24 with mild-moderate single-vessel coronary artery disease with 30-40% mid LAD lesion. Stable with no anginal symptoms, no indication for ischemic evaluation.  Continue aggressive risk factor modification.  Encouraged heart healthy and Mediterranean diets.  Highly recommend he begins exercising with 150 minutes of aerobic moderate intensity exercise weekly.  Continue atorvastatin 40 mg once daily.   Frequent PVCs -6% PVC burden noted on ZIO 12/2022.  Burden unlikely force driving cardiomyopathy. Consider Toprol-XL 100mg  in follow up in an effort to minimize PVC burden. No palpitations or irregular heart rhythms noted.  Continue carvedilol 25 mg twice daily.  Hypertension -BP today 118/77, under excellent control.  Will have him continue current antihypertensive medications as noted above.   Encouraged weight loss as this can greatly benefit his blood pressure.  Hyperlipidemia -LDL 99 on 08/2023.  Controlled but not at goal of less than 70.  Will obtain repeat fasting lipid panel on follow-up visit.  Continue atorvastatin 40 mg once daily.   OSA -Currently compliant with CPAP.  Obesity -BMI 48.73.  No longer taking Byetta.  He would benefit greatly from a GLP-1 and weight loss.  Will have him talk with his PCP regarding reinitiating GLP-1 therapy.              Dispo: Follow-up with heart failure clinic in 3 months and general cardiology in 6 months.  Signed, Denyce Robert, NP

## 2023-12-12 NOTE — Patient Instructions (Addendum)
Medication Instructions:  Your Physician recommend you continue on your current medication as directed.    *If you need a refill on your cardiac medications before your next appointment, please call your pharmacy*   Lab Work: Your provider would like for you to have following labs drawn today BMP.   If you have labs (blood work) drawn today and your tests are completely normal, you will receive your results only by: MyChart Message (if you have MyChart) OR A paper copy in the mail If you have any lab test that is abnormal or we need to change your treatment, we will call you to review the results.   Follow-Up: At [facility], you and your health needs are our priority.  As part of our continuing mission to provide you with exceptional heart care, we have created designated Provider Care Teams.  These Care Teams include your primary Cardiologist (physician) and Advanced Practice Providers (APPs -  Physician Assistants and Nurse Practitioners) who all work together to provide you with the care you need, when you need it.  We recommend signing up for the patient portal called "MyChart".  Sign up information is provided on this After Visit Summary.  MyChart is used to connect with patients for Virtual Visits (Telemedicine).  Patients are able to view lab/test results, encounter notes, upcoming appointments, etc.  Non-urgent messages can be sent to your provider as well.   To learn more about what you can do with MyChart, go to [email or web address].    Your next appointment:   6 month(s)  Provider:   You may see Debbe Odea, MD  Please schedule with Clarisa Kindred in 2-3 months

## 2023-12-13 LAB — BASIC METABOLIC PANEL
BUN/Creatinine Ratio: 13 (ref 9–20)
BUN: 17 mg/dL (ref 6–24)
CO2: 22 mmol/L (ref 20–29)
Calcium: 9 mg/dL (ref 8.7–10.2)
Chloride: 104 mmol/L (ref 96–106)
Creatinine, Ser: 1.27 mg/dL (ref 0.76–1.27)
Glucose: 83 mg/dL (ref 70–99)
Potassium: 4.5 mmol/L (ref 3.5–5.2)
Sodium: 141 mmol/L (ref 134–144)
eGFR: 67 mL/min/{1.73_m2} (ref >59)

## 2023-12-24 ENCOUNTER — Telehealth: Payer: Self-pay | Admitting: Cardiology

## 2023-12-24 NOTE — Telephone Encounter (Signed)
 Please advise if ok to refill Jardiance last filled by:  Date: 09/09/2023 Department: [facility] Ordering: Marygrace Drought, RPH-CPP Authorizing: Debbe Odea, MD

## 2023-12-24 NOTE — Telephone Encounter (Signed)
*  STAT* If patient is at the pharmacy, call can be transferred to refill team.   1. Which medications need to be refilled? (please list name of each medication and dose if known) Jardiance  and Carvedilol    2. Would you like to learn more about the convenience, safety, & potential cost savings by using the [facility]?    3. Are you open to using the [facility] (Type [facility]   4. Which pharmacy/location (including street and city if local pharmacy) is medication to be sent to?[facility] [address], ,[place]   5. Do they need a 30 day or 90 day supply? Jardiance  30 days and refills Carvedilol  90 days  # 270 and refills-

## 2023-12-25 ENCOUNTER — Other Ambulatory Visit: Payer: Self-pay

## 2023-12-25 MED ORDER — EMPAGLIFLOZIN 10 MG PO TABS: 10.0000 mg | ORAL_TABLET | Freq: Every day | ORAL | 11 refills | Status: DC

## 2023-12-25 NOTE — Telephone Encounter (Signed)
 empagliflozin  (JARDIANCE ) 10 MG TABS tablet 30 tablet 11 12/25/2023 --   Sig - Route: Take 1 tablet (10 mg total) by mouth daily before breakfast. - Oral   Sent to pharmacy as: empagliflozin  (JARDIANCE ) 10 MG Tab tablet   E-Prescribing Status: Receipt confirmed by pharmacy (12/25/2023  8:18 AM EST)    Pharmacy  [facility] - [place] ([address]), [place] - [address]

## 2023-12-29 ENCOUNTER — Telehealth: Payer: Self-pay | Admitting: Cardiology

## 2023-12-29 MED ORDER — CARVEDILOL 25 MG PO TABS: 25.0000 mg | ORAL_TABLET | Freq: Two times a day (BID) | ORAL | 0 refills | Status: DC

## 2023-12-29 MED ORDER — EMPAGLIFLOZIN 10 MG PO TABS: 10.0000 mg | ORAL_TABLET | Freq: Every day | ORAL | 11 refills | Status: AC

## 2023-12-29 NOTE — Telephone Encounter (Signed)
 Pt stated that [facility] in [place] did not have his medication in stock. Asked pt if there was another pharmacy that his medication could be sent to for the time being. Pt requested medication be sent to [facility] in [place].   Advised pt to call office back if he has any other questions or problems picking up medication.   Pt requested Jardiance  refill as well.

## 2023-12-29 NOTE — Telephone Encounter (Signed)
 Pt c/o medication issue:  1. Name of Medication:   carvedilol  (COREG ) 25 MG tablet   2. How are you currently taking this medication (dosage and times per day)? Take 1 tablet (25 mg total) by mouth 2 (two) times daily with a meal.   3. Are you having a reaction (difficulty breathing--STAT)? No   4. What is your medication issue? Patient's wife called stating the patient was taking his medication 1.5 tablets twice a day. Patient wasn't aware he should only take 1 tablet twice a day. Patient's wife stated the patient is currently out of medication since Friday and the pharmacy will not have the medication until this Saturday. Please advise.

## 2024-01-08 ENCOUNTER — Telehealth: Payer: Self-pay

## 2024-01-08 ENCOUNTER — Other Ambulatory Visit (HOSPITAL_COMMUNITY): Payer: Self-pay

## 2024-01-08 NOTE — Telephone Encounter (Signed)
Pharmacy Patient Advocate Encounter   Received notification from CoverMyMeds that prior authorization for JARDIANCE is required/requested.   Insurance verification completed.   The patient is insured through [facility] .   Per test claim: PA required; PA submitted to above mentioned insurance via CoverMyMeds Key/confirmation #/EOC [id number] Status is pending

## 2024-01-09 ENCOUNTER — Other Ambulatory Visit (HOSPITAL_COMMUNITY): Payer: Self-pay

## 2024-01-09 NOTE — Telephone Encounter (Signed)
Pharmacy Patient Advocate Encounter  Received notification from [facility] that Prior Authorization for JARDIANCE has been APPROVED from 01/08/24 to 01/07/25. Ran test claim, Copay is $4. This test claim was processed through [facility]- copay amounts may vary at other pharmacies due to pharmacy/plan contracts, or as the patient moves through the different stages of their insurance plan.

## 2024-01-19 ENCOUNTER — Ambulatory Visit: Payer: Self-pay | Admitting: Family Medicine

## 2024-02-02 ENCOUNTER — Other Ambulatory Visit: Payer: Self-pay

## 2024-02-02 MED ORDER — CARVEDILOL 25 MG PO TABS: 25.0000 mg | ORAL_TABLET | Freq: Two times a day (BID) | ORAL | 0 refills | Status: DC

## 2024-02-02 NOTE — Telephone Encounter (Signed)
Requested Prescriptions   Signed Prescriptions Disp Refills   carvedilol (COREG) 25 MG tablet 180 tablet 0    Sig: Take 1 tablet (25 mg total) by mouth 2 (two) times daily with a meal.    Authorizing Provider: Debbe Odea    Ordering User: Guerry Minors

## 2024-02-10 ENCOUNTER — Other Ambulatory Visit: Payer: Self-pay

## 2024-02-10 MED ORDER — SPIRONOLACTONE 25 MG PO TABS: 25.0000 mg | ORAL_TABLET | Freq: Every day | ORAL | 0 refills | Status: DC

## 2024-02-16 ENCOUNTER — Telehealth: Payer: Self-pay | Admitting: Family

## 2024-02-16 NOTE — Telephone Encounter (Signed)
 Pt confimed appt for 02/17/24

## 2024-02-17 ENCOUNTER — Ambulatory Visit: Payer: Medicaid Other | Attending: Family | Admitting: Family

## 2024-02-17 ENCOUNTER — Encounter: Payer: Self-pay | Admitting: Family

## 2024-02-17 VITALS — BP 112/75 | HR 83 | Wt 334.0 lb

## 2024-02-17 DIAGNOSIS — R7303 Prediabetes: Secondary | ICD-10-CM | POA: Diagnosis not present

## 2024-02-17 DIAGNOSIS — I10 Essential (primary) hypertension: Secondary | ICD-10-CM | POA: Diagnosis not present

## 2024-02-17 DIAGNOSIS — I110 Hypertensive heart disease with heart failure: Secondary | ICD-10-CM | POA: Diagnosis not present

## 2024-02-17 DIAGNOSIS — G4733 Obstructive sleep apnea (adult) (pediatric): Secondary | ICD-10-CM | POA: Diagnosis not present

## 2024-02-17 DIAGNOSIS — M549 Dorsalgia, unspecified: Secondary | ICD-10-CM | POA: Diagnosis not present

## 2024-02-17 DIAGNOSIS — G8929 Other chronic pain: Secondary | ICD-10-CM | POA: Diagnosis not present

## 2024-02-17 DIAGNOSIS — I2510 Atherosclerotic heart disease of native coronary artery without angina pectoris: Secondary | ICD-10-CM | POA: Diagnosis not present

## 2024-02-17 DIAGNOSIS — I493 Ventricular premature depolarization: Secondary | ICD-10-CM | POA: Insufficient documentation

## 2024-02-17 DIAGNOSIS — I428 Other cardiomyopathies: Secondary | ICD-10-CM | POA: Diagnosis not present

## 2024-02-17 DIAGNOSIS — E669 Obesity, unspecified: Secondary | ICD-10-CM | POA: Insufficient documentation

## 2024-02-17 DIAGNOSIS — I5022 Chronic systolic (congestive) heart failure: Secondary | ICD-10-CM | POA: Diagnosis present

## 2024-02-17 DIAGNOSIS — E785 Hyperlipidemia, unspecified: Secondary | ICD-10-CM | POA: Insufficient documentation

## 2024-02-17 NOTE — Progress Notes (Signed)
 Advanced Heart Failure Clinic Note    PCP: [name], MD (last seen 02/25) Cardiologist: Debbe Odea, MD (last seen 12/24)  Chief Complaint: shortness of breath  HPI:  William Jimenez is a 58 y/o male with a history of nonobstructive CAD by LHC in 12/2022, HFrEF secondary to NICM, frequent PVCs, HTN, HLD, prediabetse, obesity, chronic back pain, OSA and chronic heart failure.    First evaluated by Dr. Azucena Cecil on 11/04/22 for shortness of breath and abdominal distension. He was started on Lasix 40mg  daily and his edema improved. He then noted abdominal bloating, continued shortness of breath, orthopnea and hypertension. He was started on Coreg 12.5 mg BID, spironolactone 25mg  daily and Lasix was increased to 40mg  BID.   On 12/20/22 he underwent left heart catheterization that indicated mild-moderate single vessel coronary artery disease with 30-40% mid LAD lesion, LVEDP was severely elevated at 35-42mmHg, findings were consistent with non-ischemic cardiomyopathy. Following his LHC he was given IV lasix followed by titration of oral furosemide to 80mg  twice daily and lisinopril was increased to 20mg  daily.   Holter monitor worn 02/04/23: Patient had a min HR of 49 bpm, max HR of 119 bpm, and avg HR of 73 bpm. Predominant underlying rhythm was Sinus Rhythm. No Isolated SVEs, SVE Couplets, or SVE Triplets were present. Isolated VEs were frequent (6.0%, 23231), VE Couplets were occasional (1.2%, 2290), and no VE Triplets were present. Ventricular Bigeminy was present. Conclusion: Frequent PVCs, 6% burden. No other significant arrhythmias.  Was in the ED 09/01/22 due to worsening shortness of breath on exertion and orthopnea x 2 weeks. IV diuresed. Elevated troponin due to demand ischemia in the setting of suspected CHF exacerbation and not ACS.   Echo 09/02/22: EF 30-35% with mild/ moderate pulmonary HTN Echo 03/28/23: EF 35-40% along with Grade II DD, aortic valve thickening without  evidence of narrowing  LHC 12/20/22: Mild-moderate single vessel coronary artery disease with 30-40% mid LAD lesion; question focal myocardial bridging.  No significant disease observed in the LCx or RCA.  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 Severely elevated left heart filling pressures (LVEDP 35-40 mmHg).  cMRI 07/23/23: 1. Moderately dilated LV, moderate to severely reduced systolic function. LVEF 32%.  2.  Small midwall basal inferolateral scar  3.  No evidence for infiltrative disease. 4.  Normal RV size and function.  5.  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  Echo 12/08/23: EF 40-45% with G1DD, normal RV, moderate LAE, mild dilatation of aortic root at 41 mm  He presents today for a HF f/u visit with a chief complaint of shortness of breath. Improving and no SOB upon walking to the clinic today. Has associated fatigue along with this. Denies chest pain, cough, palpitations, abdominal distention, pedal edema, dizziness, weight gain or difficulty sleeping. Reports sleeping  well on 2 pillows and wearing CPAP nightly. He does mention that he does get dizzy when he changes positions. Has not had to take any PRN furosemide doses.   Using ozempic once weekly.   ROS: All systems negative except as listed in HPI, PMH and Problem List.  SH:  Social History   Socioeconomic History   Marital status: Married    Spouse name: Not on file   Number of children: Not on file   Years of education: Not on file   Highest education level: Not on file  Occupational History   Not on file  Tobacco Use   Smoking status: Never  Smokeless tobacco: Current    Types: Snuff  Vaping Use   Vaping status: Never Used  Substance and Sexual Activity   Alcohol use: No   Drug use: No   Sexual activity: Yes    Birth control/protection: None  Other Topics Concern   Not on file  Social History Narrative   Not on file   Social Drivers of Health   Financial  Resource Strain: Low Risk  (09/02/2022)   Received from [facility], [facility]   Overall Financial Resource Strain (CARDIA)    Difficulty of Paying Living Expenses: Not hard at all  Food Insecurity: No Food Insecurity (09/02/2022)   Received from [facility], [facility]   Hunger Vital Sign    Worried About Running Out of Food in the Last Year: Never true    Ran Out of Food in the Last Year: Never true  Transportation Needs: No Transportation Needs (09/02/2022)   Received from [facility], [facility]   [facility] - Transportation    Lack of Transportation (Medical): No    Lack of Transportation (Non-Medical): No  Physical Activity: Not on file  Stress: Not on file  Social Connections: Not on file  Intimate Partner Violence: Not on file    FH:  Family History  Problem Relation Age of Onset   Prostate cancer Neg Hx    Bladder Cancer Neg Hx    Kidney cancer Neg Hx     Past Medical History:  Diagnosis Date   HLD (hyperlipidemia)    Hypertension    NICM (nonischemic cardiomyopathy) (HCC)     Current Outpatient Medications  Medication Sig Dispense Refill   acetaminophen (TYLENOL) 500 MG tablet Take 500 mg by mouth as needed.     albuterol (VENTOLIN HFA) 108 (90 Base) MCG/ACT inhaler Inhale 2 puffs into the lungs every 6 (six) hours as needed for wheezing or shortness of breath. 8 g 2   atorvastatin (LIPITOR) 20 MG tablet Take 1 tablet (20 mg total) by mouth daily. 90 tablet 3   carvedilol (COREG) 25 MG tablet Take 1 tablet (25 mg total) by mouth 2 (two) times daily with a meal. 180 tablet 0   empagliflozin (JARDIANCE) 10 MG TABS tablet Take 1 tablet (10 mg total) by mouth daily before breakfast. 30 tablet 11   furosemide (LASIX) 40 MG tablet TAKE 1 TABLET BY MOUTH ONCE DAILY -  MAY  TAKE  ADDITIONAL  TAB  ONLY  AS  NEEDED 30 tablet 8   sacubitril-valsartan (ENTRESTO) 97-103 MG Take 1 tablet by mouth 2 (two) times daily. 60 tablet 3   sertraline (ZOLOFT) 50  MG tablet Take 1 tablet (50 mg total) by mouth daily.     spironolactone (ALDACTONE) 25 MG tablet Take 1 tablet (25 mg total) by mouth daily. 90 tablet 0   No current facility-administered medications for this visit.   Vitals:   02/17/24 1411  BP: 112/75  Pulse: 83  SpO2: 97%  Weight: (!) 334 lb (151.5 kg)   Wt Readings from Last 3 Encounters:  02/17/24 (!) 334 lb (151.5 kg)  12/12/23 (!) 339 lb 9.6 oz (154 kg)  09/09/23 (!) 347 lb 9.6 oz (157.7 kg)   Lab Results  Component Value Date   CREATININE 1.27 12/12/2023   CREATININE 1.50 (H) 07/03/2023   CREATININE 1.24 01/10/2023    PHYSICAL EXAM:  General: Well appearing. No resp difficulty HEENT: normal Neck: supple, no JVD Cor: Regular rhythm, rate.  No rubs, gallops or murmurs Lungs: clear Abdomen: soft, nontender, nondistended. Extremities: no cyanosis, clubbing, rash, edema Neuro: alert & oriented X 3. Moves all 4 extremities w/o difficulty. Affect pleasant   ECG: not done   ASSESSMENT & PLAN:  1: Chronic NICM with reduced ejection fraction- - suspect due to sleep apnea/ HTN as cath showed nonobstructive CAD - NYHA class II - euvolemic - weighing daily; reminded to call for overnight weight gain of > 2 pounds or a weekly weight gain of > 5 pounds - weight down 13 pounds from last visit here 5 months ago - Echo 09/02/22: EF 30-35% with mild/ moderate pulmonary HTN - Echo 03/28/23: EF 35-40% along with Grade II DD, aortic valve thickening without evidence of narrowing - cMRI 07/23/23: 1. Moderately dilated LV, moderate to severely reduced systolic function. LVEF 32%.  2.  Small midwall basal inferolateral scar  3.  No evidence for infiltrative disease. 4.  Normal RV size and function.  5.  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 - Echo 12/08/23: EF 40-45% with G1DD, normal RV, moderate LAE, mild dilatation of aortic root at 41 mm - not adding salt to his food - continue carvedilol 25mg  BID -  continue jardiance 10mg  daily - change furosemide to PRN only and hopefully that will help lessen dizziness; encouraged slow position changes as well - continue entresto to 97/103mg  BID - continue spironolactone 25mg  daily - BNP 07/03/23 was 23.5  2: HTN- - BP 112/75 - sees PCP Marvis Moeller) @ [facility]; is planning on getting established at [facility] next week - BMP 12/12/23 reviewed and showed sodium 141, potassium 4.5, creatinine 1.27 & GFR 67  3: Nonobstructive CAD- - LHC 12/20/22:   Mild-moderate single vessel coronary artery disease with 30-40% mid LAD lesion; question focal myocardial bridging.  No significant disease observed in the LCx or RCA.  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   Severely elevated left heart filling pressures (LVEDP 35-40 mmHg). - saw cardiology Leota Jacobsen) 12/24 - continue atorvastatin 20mg  daily - LDL 09/08/23 reviewed and was 99  4: Sleep apnea- - wearing CPAP nightly and says that he's sleeping well   Return in 6 months, sooner if needed  Delma Freeze, FNP 02/17/24

## 2024-02-17 NOTE — Patient Instructions (Signed)
 Take your lasix ONLY if you need it for weight gain, swelling or worsening shortness of breath.

## 2024-02-26 ENCOUNTER — Encounter: Payer: Self-pay | Admitting: Family Medicine

## 2024-02-26 ENCOUNTER — Ambulatory Visit: Payer: Medicaid Other | Admitting: Family Medicine

## 2024-02-26 VITALS — BP 148/83 | HR 40 | Temp 97.9°F | Resp 16 | Ht 71.0 in | Wt 333.4 lb

## 2024-02-26 DIAGNOSIS — N182 Chronic kidney disease, stage 2 (mild): Secondary | ICD-10-CM

## 2024-02-26 DIAGNOSIS — I5020 Unspecified systolic (congestive) heart failure: Secondary | ICD-10-CM

## 2024-02-26 DIAGNOSIS — E1122 Type 2 diabetes mellitus with diabetic chronic kidney disease: Secondary | ICD-10-CM | POA: Diagnosis not present

## 2024-02-26 DIAGNOSIS — R7309 Other abnormal glucose: Secondary | ICD-10-CM | POA: Insufficient documentation

## 2024-02-26 DIAGNOSIS — I10 Essential (primary) hypertension: Secondary | ICD-10-CM

## 2024-02-26 DIAGNOSIS — Z7689 Persons encountering health services in other specified circumstances: Secondary | ICD-10-CM

## 2024-02-26 DIAGNOSIS — K219 Gastro-esophageal reflux disease without esophagitis: Secondary | ICD-10-CM | POA: Diagnosis not present

## 2024-02-26 MED ORDER — BLOOD GLUCOSE MONITORING SUPPL DEVI
1.0000 | Freq: Every day | 0 refills | Status: AC
Start: 2024-02-26 — End: ?

## 2024-02-26 MED ORDER — OMEPRAZOLE 20 MG PO CPDR
20.0000 mg | DELAYED_RELEASE_CAPSULE | Freq: Every day | ORAL | 1 refills | Status: DC
Start: 2024-02-26 — End: 2024-08-23

## 2024-02-26 MED ORDER — OZEMPIC (1 MG/DOSE) 4 MG/3ML ~~LOC~~ SOPN: 1.0000 mg | PEN_INJECTOR | SUBCUTANEOUS | 2 refills | Status: DC

## 2024-02-26 MED ORDER — LANCETS MISC. MISC: 1.0000 | Freq: Every day | 3 refills | Status: AC

## 2024-02-26 MED ORDER — LANCET DEVICE MISC: 1.0000 | Freq: Every day | 0 refills | Status: DC

## 2024-02-26 MED ORDER — BLOOD GLUCOSE TEST VI STRP: 1.0000 | ORAL_STRIP | Freq: Every day | 3 refills | Status: DC

## 2024-02-26 NOTE — Progress Notes (Signed)
 New patient visit   Patient: William Jimenez   DOB: May 27, 1966   58 y.o. Male  MRN: [id number] Visit Date: 02/26/2024  Today's healthcare provider: Sherlyn Hay, DO   Chief Complaint  Patient presents with   Establish Care   Subjective    William Jimenez is a 58 y.o. male who presents today as a new patient to establish care.   HPI  The patient presents to establish care. He states he was supposed to be accompanied by his wife, but she could not attend.  He has been under the care of a cardiologist for his heart condition, which is reportedly improving. He had a recent echocardiogram in December and is currently taking Entresto.  He started Ozempic about a month ago after transitioning from Byetta. He has taken one full pen and started the second one, with some confusion about the dosing due to his previous use of Byetta. He experiences occasional heartburn since starting Ozempic, which he previously managed with Prevacid. He is considering using over-the-counter medications for heartburn.  He has a history of prediabetes and was on metformin in the past. He has had elevated A1c levels in the past, with some readings in the diabetic range. He does not currently have a glucose meter at home but is willing to monitor his blood sugar. No numbness or tingling.  He has chronic kidney disease stage two and avoids NSAIDs, using only Tylenol for pain management due to his heart condition.  He reports significant weight loss from 350 pounds and denies having diabetes, though he acknowledges a history of prediabetes. He has not had any recent eye exams or colonoscopies but completed a Cologuard test last year. He has not received the shingles or pneumonia vaccines.  He tries to engage in physical activity by raking and walking his dogs, though he finds it challenging at times. He uses [facility] for his medications.  No chest pain, shortness of breath, headaches, dizziness, cough,  belly pain, nausea, vomiting, constipation, diarrhea, ear pain, sore throat, trouble swallowing, or acute vision changes. He experiences lightheadedness when standing up quickly, which he discussed with his cardiologist.      From record: HCV 09/02/2022 08:04 non-reactive   Echo 12/08/23: EF 40-45% with G1DD, normal RV, moderate LAE, mild dilatation of aortic root at 41 mm   From cardiology notes: - On 12/20/22 he underwent left heart catheterization that indicated mild-moderate single vessel coronary artery disease with 30-40% mid LAD lesion, LVEDP was severely elevated at 35-70mmHg, findings were consistent with non-ischemic cardiomyopathy. Following his LHC he was given IV lasix followed by titration of oral furosemide to 80mg  twice daily and lisinopril was increased to 20mg  daily.   - Holter monitor worn 02/04/23: Patient had a min HR of 49 bpm, max HR of 119 bpm, and avg HR of 73 bpm. Predominant underlying rhythm was Sinus Rhythm. No Isolated SVEs, SVE Couplets, or SVE Triplets were present. Isolated VEs were frequent (6.0%, 23231), VE Couplets were occasional (1.2%, 2290), and no VE Triplets were present. Ventricular Bigeminy was present. Conclusion: Frequent PVCs, 6% burden. No other significant arrhythmias. - nonobstructive CAD by LHC in 12/2022, HFrEF secondary to NICM, frequent PVCs, HTN, HLD, prediabetse, obesity, chronic back pain, OSA and chronic heart failure    Past Medical History:  Diagnosis Date   Anxiety    Arthritis    Chronic renal impairment, stage 2 (mild) 09/01/2022   Diabetes mellitus without complication (HCC)    GERD (  gastroesophageal reflux disease)    HLD (hyperlipidemia)    Hypertension    Hypertensive emergency 04/07/2022   NICM (nonischemic cardiomyopathy) (HCC)    Pre-diabetes 09/01/2022   Sleep apnea    Past Surgical History:  Procedure Laterality Date   CYSTOSCOPY     LEFT HEART CATH AND CORONARY ANGIOGRAPHY Left 12/20/2022   Procedure: LEFT HEART CATH  AND CORONARY ANGIOGRAPHY;  Surgeon: Yvonne Kendall, MD;  Location: [facility] INVASIVE CV LAB;  Service: Cardiovascular;  Laterality: Left;   Family Status  Relation Name Status   Mother  Deceased   Father  Deceased   Neg Hx  (Not Specified)  No partnership data on file   Family History  Problem Relation Age of Onset   Kidney disease Mother    Heart disease Father    Prostate cancer Neg Hx    Bladder Cancer Neg Hx    Kidney cancer Neg Hx    Social History   Socioeconomic History   Marital status: Married    Spouse name: Not on file   Number of children: Not on file   Years of education: Not on file   Highest education level: Not on file  Occupational History   Not on file  Tobacco Use   Smoking status: Never   Smokeless tobacco: Current    Types: Snuff  Vaping Use   Vaping status: Never Used  Substance and Sexual Activity   Alcohol use: No   Drug use: No   Sexual activity: Yes    Birth control/protection: None  Other Topics Concern   Not on file  Social History Narrative   Not on file   Social Drivers of Health   Financial Resource Strain: Low Risk  (09/02/2022)   Received from [facility], [facility]   Overall Financial Resource Strain (CARDIA)    Difficulty of Paying Living Expenses: Not hard at all  Food Insecurity: No Food Insecurity (09/02/2022)   Received from [facility], [facility]   Hunger Vital Sign    Worried About Running Out of Food in the Last Year: Never true    Ran Out of Food in the Last Year: Never true  Transportation Needs: No Transportation Needs (09/02/2022)   Received from [facility], [facility]   PRAPARE - Transportation    Lack of Transportation (Medical): No    Lack of Transportation (Non-Medical): No  Physical Activity: Not on file  Stress: Not on file  Social Connections: Not on file   Outpatient Medications Prior to Visit  Medication Sig   acetaminophen (TYLENOL) 500 MG tablet Take 500 mg by mouth as  needed.   albuterol (VENTOLIN HFA) 108 (90 Base) MCG/ACT inhaler Inhale 2 puffs into the lungs every 6 (six) hours as needed for wheezing or shortness of breath.   atorvastatin (LIPITOR) 40 MG tablet Take 40 mg by mouth daily.   carvedilol (COREG) 25 MG tablet Take 1 tablet (25 mg total) by mouth 2 (two) times daily with a meal.   empagliflozin (JARDIANCE) 10 MG TABS tablet Take 1 tablet (10 mg total) by mouth daily before breakfast.   ENTRESTO 49-51 MG Take 1 tablet by mouth 2 (two) times daily.   furosemide (LASIX) 40 MG tablet TAKE 1 TABLET BY MOUTH ONCE DAILY -  MAY  TAKE  ADDITIONAL  TAB  ONLY  AS  NEEDED (Patient taking differently: Take 40 mg by mouth daily as needed.)   sertraline (ZOLOFT) 50 MG tablet  Take 1 tablet (50 mg total) by mouth daily.   spironolactone (ALDACTONE) 25 MG tablet Take 1 tablet (25 mg total) by mouth daily.   [DISCONTINUED] Semaglutide (OZEMPIC, 1 MG/DOSE, [place]) Inject 1 mg into the skin once a week.   atorvastatin (LIPITOR) 20 MG tablet Take 1 tablet (20 mg total) by mouth daily.   sacubitril-valsartan (ENTRESTO) 97-103 MG Take 1 tablet by mouth 2 (two) times daily.   No facility-administered medications prior to visit.   No Known Allergies  Immunization History  Administered Date(s) Administered   PPD Test 04/27/2021   Tdap 07/03/2010    Health Maintenance  Topic Date Due   OPHTHALMOLOGY EXAM  Never done   INFLUENZA VACCINE  03/15/2024 (Originally 07/17/2023)   DTaP/Tdap/Td (2 - Td or Tdap) 04/05/2024 (Originally 07/03/2020)   Fecal DNA (Cologuard)  04/05/2024 (Originally 06/07/2013)   Zoster Vaccines- Shingrix (1 of 2) 05/28/2024 (Originally 06/08/1987)   COVID-19 Vaccine (1) 09/15/2024 (Originally 06/07/1973)   Pneumococcal Vaccine 61-71 Years old (1 of 2 - PCV) 02/25/2025 (Originally 06/07/1974)   HEMOGLOBIN A1C  08/28/2024   Diabetic kidney evaluation - eGFR measurement  02/25/2025   Diabetic kidney evaluation - Urine ACR  02/25/2025   FOOT EXAM   02/25/2025   Hepatitis C Screening  Completed   HIV Screening  Completed   HPV VACCINES  Aged Out    Patient Care Team: Kalub Morillo, Monico Blitz, DO as PCP - General (Family Medicine) Debbe Odea, MD as PCP - Cardiology (Cardiology)  Review of Systems  Constitutional:  Negative for appetite change, chills, fatigue and fever.  HENT:  Negative for congestion, ear pain, hearing loss, nosebleeds and trouble swallowing.   Eyes:  Negative for pain and visual disturbance.  Respiratory:  Negative for cough, chest tightness and shortness of breath.   Cardiovascular:  Negative for chest pain, palpitations and leg swelling.  Gastrointestinal:  Negative for abdominal pain, blood in stool, constipation, diarrhea, nausea and vomiting.  Endocrine: Negative for polydipsia, polyphagia and polyuria.  Genitourinary:  Negative for dysuria and flank pain.  Musculoskeletal:  Negative for arthralgias, back pain, joint swelling, myalgias and neck stiffness.  Skin:  Negative for color change, rash and wound.  Neurological:  Positive for light-headedness (occasionally with bending over). Negative for dizziness, tremors, seizures, speech difficulty, weakness and headaches.  Psychiatric/Behavioral:  Negative for behavioral problems, confusion, decreased concentration, dysphoric mood and sleep disturbance. The patient is not nervous/anxious.   All other systems reviewed and are negative.       Objective    BP (!) 148/83 (BP Location: Left Arm, Patient Position: Sitting, Cuff Size: Large)   Pulse (!) 40   Temp 97.9 F (36.6 C) (Oral)   Resp 16   Ht 5\' 11"  (1.803 m)   Wt (!) 333 lb 6.4 oz (151.2 kg)   SpO2 98%   BMI 46.50 kg/m     Physical Exam Vitals and nursing note reviewed.  Constitutional:      General: He is awake.     Appearance: Normal appearance.  HENT:     Head: Normocephalic and atraumatic.     Right Ear: Tympanic membrane, ear canal and external ear normal.     Left Ear: Tympanic  membrane, ear canal and external ear normal.     Nose: Nose normal.     Mouth/Throat:     Mouth: Mucous membranes are moist.     Pharynx: Oropharynx is clear. No oropharyngeal exudate or posterior oropharyngeal erythema.  Eyes:  General: No scleral icterus.    Extraocular Movements: Extraocular movements intact.     Conjunctiva/sclera: Conjunctivae normal.     Pupils: Pupils are equal, round, and reactive to light.  Neck:     Thyroid: No thyromegaly or thyroid tenderness.  Cardiovascular:     Rate and Rhythm: Regular rhythm. Bradycardia present.     Pulses: Normal pulses.          Dorsalis pedis pulses are 2+ on the right side and 2+ on the left side.       Posterior tibial pulses are 2+ on the right side and 2+ on the left side.     Heart sounds: Normal heart sounds.  Pulmonary:     Effort: Pulmonary effort is normal. No tachypnea, bradypnea or respiratory distress.     Breath sounds: Normal breath sounds. No stridor. No wheezing, rhonchi or rales.  Abdominal:     General: Bowel sounds are normal. There is no distension.     Palpations: Abdomen is soft. There is no mass.     Tenderness: There is no abdominal tenderness. There is no guarding.     Hernia: No hernia is present.  Musculoskeletal:     Cervical back: Normal range of motion and neck supple.     Right lower leg: No edema.     Left lower leg: No edema.     Right foot: Normal range of motion. No deformity, bunion, Charcot foot, foot drop or prominent metatarsal heads.     Left foot: Normal range of motion. No deformity, bunion, Charcot foot, foot drop or prominent metatarsal heads.  Feet:     Right foot:     Protective Sensation: 10 sites tested.  10 sites sensed.     Skin integrity: No ulcer, blister, skin breakdown, erythema, warmth, callus, dry skin or fissure.     Toenail Condition: Right toenails are normal.     Left foot:     Protective Sensation: 10 sites tested.  10 sites sensed.     Skin integrity: No ulcer,  blister, skin breakdown, erythema, warmth, callus, dry skin or fissure.     Toenail Condition: Left toenails are normal.  Lymphadenopathy:     Cervical: No cervical adenopathy.  Skin:    General: Skin is warm and dry.  Neurological:     Mental Status: He is alert and oriented to person, place, and time. Mental status is at baseline.  Psychiatric:        Mood and Affect: Mood normal.        Behavior: Behavior normal.     Depression Screen    02/26/2024   10:04 AM 07/10/2023    1:08 PM  PHQ 2/9 Scores  PHQ - 2 Score 0 1   Results for orders placed or performed in visit on 02/26/24  [facility] HEPATITIS C SCREENING LAB  Result Value Ref Range   HM Hepatitis Screen Negative-Validated   Results for orders placed or performed in visit on 02/26/24  Microalbumin / creatinine urine ratio  Result Value Ref Range   Creatinine, Urine 156.9 Not Estab. mg/dL   Microalbumin, Urine 9.9 Not Estab. ug/mL   Microalb/Creat Ratio 6 0 - 29 mg/g creat  Comprehensive metabolic panel  Result Value Ref Range   Glucose 94 70 - 99 mg/dL   BUN 13 6 - 24 mg/dL   Creatinine, Ser 1.61 0.76 - 1.27 mg/dL   eGFR 70 >09 UE/AVW/0.98   BUN/Creatinine Ratio 11 9 - 20  Sodium 141 134 - 144 mmol/L   Potassium 5.0 3.5 - 5.2 mmol/L   Chloride 104 96 - 106 mmol/L   CO2 21 20 - 29 mmol/L   Calcium 9.8 8.7 - 10.2 mg/dL   Total Protein 6.6 6.0 - 8.5 g/dL   Albumin 4.3 3.8 - 4.9 g/dL   Globulin, Total 2.3 1.5 - 4.5 g/dL   Bilirubin Total 0.3 0.0 - 1.2 mg/dL   Alkaline Phosphatase 92 44 - 121 IU/L   AST 17 0 - 40 IU/L   ALT 19 0 - 44 IU/L  Hemoglobin A1c  Result Value Ref Range   Hgb A1c MFr Bld 6.5 (H) 4.8 - 5.6 %   Est. average glucose Bld gHb Est-mCnc 140 mg/dL  VITAMIN D 25 Hydroxy (Vit-D Deficiency, Fractures)  Result Value Ref Range   Vit D, 25-Hydroxy 15.1 (L) 30.0 - 100.0 ng/mL    Assessment & Plan     Primary hypertension Assessment & Plan: Variable blood pressure readings, currently on  antihypertensive medication. Recent cardiologist readings were 112/75 and 118/77, within normal range. - Continue current antihypertensive medication. - Monitor blood pressure at home regularly.   Encounter to establish care with new doctor Assessment & Plan: Due for routine screenings and vaccinations. No recent eye exam, recommended due to diabetes. Completed Cologuard in 2023. Has not received shingles or pneumonia vaccines. Previous HIV screening negative, hepatitis C screened in hospital. - Schedule eye exam for diabetic retinopathy screening. - Request Cologuard results from [facility]. - Recommend shingles and pneumonia vaccines, available at pharmacy or in-office. - Request records of last tetanus vaccine, possibly done in early 2023. - Encourage regular physical activity, such as walking and yard work.   Type 2 diabetes mellitus with stage 2 chronic kidney disease, without long-term current use of insulin (HCC) Assessment & Plan: Diabetes Mellitus Diabetes mellitus managed with Ozempic, started a month ago. He reports occasional heartburn, possibly medication-related. Recent glucose level was 83 (non-fasting). History of elevated A1c, with the most recent in 2023 meeting diabetes criteria. No neuropathy symptoms. Willing to monitor blood glucose at home and adjust medication based on A1c results. - Send glucose meter for home monitoring. - Check A1c level. - Consider increasing Ozempic dose if A1c remains elevated. - Recommend eye exam for diabetic retinopathy screening and send results to clinic. - Educate on the importance of regular eye exams and blood glucose monitoring.  Chronic Kidney Disease Stage 2 Chronic kidney disease stage 2 with GFR between 60 and 90. Advised to avoid NSAIDs to prevent further damage, currently using Tylenol for pain. Aware of the need to avoid medications exacerbating kidney issues. - Educate on avoiding NSAIDs and using Tylenol for pain. - Check  urine for kidney function. - Monitor kidney function regularly.  Orders: -     Microalbumin / creatinine urine ratio -     Comprehensive metabolic panel -     Hemoglobin A1c -     VITAMIN D 25 Hydroxy (Vit-D Deficiency, Fractures) -     Blood Glucose Monitoring Suppl; 1 each by Does not apply route daily before breakfast. May substitute to any manufacturer covered by patient's insurance.  Dispense: 1 each; Refill: 0 -     Blood Glucose Test; 1 each by In Vitro route daily before breakfast. May substitute to any manufacturer covered by patient's insurance.  Dispense: 100 strip; Refill: 3 -     Lancet Device; 1 each by Does not apply route daily before breakfast. May substitute  to any manufacturer covered by patient's insurance.  Dispense: 1 each; Refill: 0 -     Lancets Misc.; 1 each by Does not apply route daily before breakfast. May substitute to any manufacturer covered by patient's insurance.  Dispense: 100 each; Refill: 3 -     Ozempic (1 MG/DOSE); Inject 1 mg into the skin once a week.  Dispense: 3 mL; Refill: 2  Gastroesophageal reflux disease, unspecified whether esophagitis present Assessment & Plan: Experiences occasional heartburn, possibly related to medication. Previously used Prevacid effectively. Considering OTC medications for relief. - Prescribe omeprazole 30 minutes before breakfast or dinner, on an empty stomach. - Educate on lifestyle modifications to manage GERD, such as dietary changes.  Orders: -     Omeprazole; Take 1 capsule (20 mg total) by mouth daily.  Dispense: 90 capsule; Refill: 1  Heart failure with mildly reduced ejection fraction (HFmrEF, 41-49%) (HCC) Assessment & Plan: On entresto, lasix, coreg, jardiance, spironolactone and atorvastatin. Managed by cardiology; defer to specialist management.    Follow-up Follow-up needed for diabetes management and other conditions. Open to medication adjustment based on A1c results and willing to monitor blood  glucose at home. - Schedule follow-up appointment in 3 months to reassess diabetes management and overall health. - Request cologuard result, most recent Tdap vaccination, and last year of records from [facility]  Return in about 3 months (around 05/28/2024) for HTN, DM.     I discussed the assessment and treatment plan with the patient  The patient was provided an opportunity to ask questions and all were answered. The patient agreed with the plan and demonstrated an understanding of the instructions.   The patient was advised to call back or seek an in-person evaluation if the symptoms worsen or if the condition fails to improve as anticipated.    Sherlyn Hay, DO  [facility] [phone] (phone) [phone] (fax)  [facility]

## 2024-02-27 LAB — COMPREHENSIVE METABOLIC PANEL
ALT: 19 IU/L (ref 0–44)
AST: 17 IU/L (ref 0–40)
Albumin: 4.3 g/dL (ref 3.8–4.9)
Alkaline Phosphatase: 92 IU/L (ref 44–121)
BUN/Creatinine Ratio: 11 (ref 9–20)
BUN: 13 mg/dL (ref 6–24)
Bilirubin Total: 0.3 mg/dL (ref 0.0–1.2)
CO2: 21 mmol/L (ref 20–29)
Calcium: 9.8 mg/dL (ref 8.7–10.2)
Chloride: 104 mmol/L (ref 96–106)
Creatinine, Ser: 1.22 mg/dL (ref 0.76–1.27)
Globulin, Total: 2.3 g/dL (ref 1.5–4.5)
Glucose: 94 mg/dL (ref 70–99)
Potassium: 5 mmol/L (ref 3.5–5.2)
Sodium: 141 mmol/L (ref 134–144)
Total Protein: 6.6 g/dL (ref 6.0–8.5)
eGFR: 70 mL/min/{1.73_m2} (ref >59)

## 2024-02-27 LAB — MICROALBUMIN / CREATININE URINE RATIO
Creatinine, Urine: 156.9 mg/dL
Microalb/Creat Ratio: 6 mg/g{creat} (ref 0–29)
Microalbumin, Urine: 9.9 ug/mL

## 2024-02-27 LAB — HEMOGLOBIN A1C
Est. average glucose Bld gHb Est-mCnc: 140 mg/dL
Hgb A1c MFr Bld: 6.5 % — ABNORMAL HIGH (ref 4.8–5.6)

## 2024-02-27 LAB — VITAMIN D 25 HYDROXY (VIT D DEFICIENCY, FRACTURES): Vit D, 25-Hydroxy: 15.1 ng/mL — ABNORMAL LOW (ref 30.0–100.0)

## 2024-03-01 ENCOUNTER — Encounter: Payer: Self-pay | Admitting: Family Medicine

## 2024-03-05 ENCOUNTER — Encounter: Payer: Self-pay | Admitting: Family Medicine

## 2024-03-05 DIAGNOSIS — I5020 Unspecified systolic (congestive) heart failure: Secondary | ICD-10-CM | POA: Insufficient documentation

## 2024-03-05 DIAGNOSIS — K219 Gastro-esophageal reflux disease without esophagitis: Secondary | ICD-10-CM | POA: Insufficient documentation

## 2024-03-05 NOTE — Assessment & Plan Note (Signed)
 Diabetes Mellitus Diabetes mellitus managed with Ozempic, started a month ago. He reports occasional heartburn, possibly medication-related. Recent glucose level was 83 (non-fasting). History of elevated A1c, with the most recent in 2023 meeting diabetes criteria. No neuropathy symptoms. Willing to monitor blood glucose at home and adjust medication based on A1c results. - Send glucose meter for home monitoring. - Check A1c level. - Consider increasing Ozempic dose if A1c remains elevated. - Recommend eye exam for diabetic retinopathy screening and send results to clinic. - Educate on the importance of regular eye exams and blood glucose monitoring.  Chronic Kidney Disease Stage 2 Chronic kidney disease stage 2 with GFR between 60 and 90. Advised to avoid NSAIDs to prevent further damage, currently using Tylenol for pain. Aware of the need to avoid medications exacerbating kidney issues. - Educate on avoiding NSAIDs and using Tylenol for pain. - Check urine for kidney function. - Monitor kidney function regularly.

## 2024-03-05 NOTE — Assessment & Plan Note (Signed)
 Due for routine screenings and vaccinations. No recent eye exam, recommended due to diabetes. Completed Cologuard in 2023. Has not received shingles or pneumonia vaccines. Previous HIV screening negative, hepatitis C screened in hospital. - Schedule eye exam for diabetic retinopathy screening. - Request Cologuard results from [facility]. - Recommend shingles and pneumonia vaccines, available at pharmacy or in-office. - Request records of last tetanus vaccine, possibly done in early 2023. - Encourage regular physical activity, such as walking and yard work.

## 2024-03-05 NOTE — Assessment & Plan Note (Addendum)
 On entresto, lasix, coreg, jardiance, spironolactone and atorvastatin. Managed by cardiology; defer to specialist management.

## 2024-03-05 NOTE — Assessment & Plan Note (Signed)
 Experiences occasional heartburn, possibly related to medication. Previously used Prevacid effectively. Considering OTC medications for relief. - Prescribe omeprazole 30 minutes before breakfast or dinner, on an empty stomach. - Educate on lifestyle modifications to manage GERD, such as dietary changes.

## 2024-03-05 NOTE — Assessment & Plan Note (Signed)
 Variable blood pressure readings, currently on antihypertensive medication. Recent cardiologist readings were 112/75 and 118/77, within normal range. - Continue current antihypertensive medication. - Monitor blood pressure at home regularly.

## 2024-03-06 ENCOUNTER — Encounter: Payer: Self-pay | Admitting: Family Medicine

## 2024-03-24 LAB — HM DIABETES EYE EXAM

## 2024-04-12 ENCOUNTER — Encounter: Payer: Self-pay | Admitting: Family Medicine

## 2024-05-02 ENCOUNTER — Other Ambulatory Visit: Payer: Self-pay | Admitting: Family

## 2024-05-03 NOTE — Telephone Encounter (Signed)
 Called patient to verify which dosage he is taking of Entresto  medication. Pt states he is taking 49/51 MG dose, 1 tablet BID.  Provider, Shawnee Dellen, notified. 97/103 MG removed from pt's med list for now.

## 2024-05-06 NOTE — Telephone Encounter (Unsigned)
 Copied from CRM [id number]. Topic: Clinical - Medication Refill >> May 06, 2024 11:06 AM [name] wrote: Medication: sertraline  50 mg tablet  Has the patient contacted their pharmacy? Yes (Agent: If no, request that the patient contact the pharmacy for the refill. If patient does not wish to contact the pharmacy document the reason why and proceed with request.) (Agent: If yes, when and what did the pharmacy advise?)  This is the patient's preferred pharmacy:  [facility] [address] ([address]), [place] - [address] [place]) [place] [address] Phone: [phone] Fax: [phone]  Is this the correct pharmacy for this prescription? Yes If no, delete pharmacy and type the correct one.   Has the prescription been filled recently? No  Is the patient out of the medication? Yes  Has the patient been seen for an appointment in the last year OR does the patient have an upcoming appointment? Yes  Can we respond through MyChart? Yes  Agent: Please be advised that Rx refills may take up to 3 business days. We ask that you follow-up with your pharmacy.

## 2024-05-06 NOTE — Telephone Encounter (Unsigned)
 Copied from CRM [id number]. Topic: Clinical - Medication Refill >> May 06, 2024 11:13 AM [name] wrote: Medication: carvedilol  25 mg  Has the patient contacted their pharmacy? Yes (Agent: If no, request that the patient contact the pharmacy for the refill. If patient does not wish to contact the pharmacy document the reason why and proceed with request.) (Agent: If yes, when and what did the pharmacy advise?)  This is the patient's preferred pharmacy:  [facility] [address] ([address]),  - [address] [place]) [place] [address] Phone: [phone] Fax: [phone]   Is this the correct pharmacy for this prescription? Yes If no, delete pharmacy and type the correct one.   Has the prescription been filled recently? No  Is the patient out of the medication? Yes  Has the patient been seen for an appointment in the last year OR does the patient have an upcoming appointment? Yes  Can we respond through MyChart? Yes  Agent: Please be advised that Rx refills may take up to 3 business days. We ask that you follow-up with your pharmacy.

## 2024-05-12 ENCOUNTER — Other Ambulatory Visit: Payer: Self-pay

## 2024-05-12 MED ORDER — CARVEDILOL 25 MG PO TABS: 25.0000 mg | ORAL_TABLET | Freq: Two times a day (BID) | ORAL | 0 refills | Status: DC

## 2024-05-22 ENCOUNTER — Other Ambulatory Visit: Payer: Self-pay | Admitting: Cardiology

## 2024-05-28 ENCOUNTER — Encounter: Payer: Self-pay | Admitting: Family Medicine

## 2024-05-28 ENCOUNTER — Ambulatory Visit (INDEPENDENT_AMBULATORY_CARE_PROVIDER_SITE_OTHER): Admitting: Family Medicine

## 2024-05-28 VITALS — BP 132/85 | HR 69 | Resp 16 | Ht 71.0 in | Wt 333.8 lb

## 2024-05-28 DIAGNOSIS — E559 Vitamin D deficiency, unspecified: Secondary | ICD-10-CM

## 2024-05-28 DIAGNOSIS — E1122 Type 2 diabetes mellitus with diabetic chronic kidney disease: Secondary | ICD-10-CM | POA: Diagnosis not present

## 2024-05-28 DIAGNOSIS — I10 Essential (primary) hypertension: Secondary | ICD-10-CM | POA: Diagnosis not present

## 2024-05-28 DIAGNOSIS — N182 Chronic kidney disease, stage 2 (mild): Secondary | ICD-10-CM

## 2024-05-28 DIAGNOSIS — Z713 Dietary counseling and surveillance: Secondary | ICD-10-CM

## 2024-05-28 DIAGNOSIS — Z7985 Long-term (current) use of injectable non-insulin antidiabetic drugs: Secondary | ICD-10-CM

## 2024-05-28 LAB — POCT GLYCOSYLATED HEMOGLOBIN (HGB A1C)
Hemoglobin A1C: 6.1 % — AB (ref 4.0–5.6)
PT: 128

## 2024-05-28 MED ORDER — VITAMIN D (ERGOCALCIFEROL) 1.25 MG (50000 UNIT) PO CAPS
50000.0000 [IU] | ORAL_CAPSULE | ORAL | 1 refills | Status: AC
Start: 2024-05-28 — End: ?

## 2024-05-28 MED ORDER — BLOOD GLUCOSE TEST VI STRP: 1.0000 | ORAL_STRIP | Freq: Every day | 3 refills | Status: AC

## 2024-05-28 MED ORDER — SEMAGLUTIDE (2 MG/DOSE) 8 MG/3ML ~~LOC~~ SOPN: 2.0000 mg | PEN_INJECTOR | SUBCUTANEOUS | 4 refills | Status: AC

## 2024-05-28 MED ORDER — LANCETS MISC. MISC
1.0000 | Freq: Every day | 3 refills | Status: AC
Start: 2024-05-28 — End: 2024-06-27

## 2024-05-28 MED ORDER — BLOOD GLUCOSE MONITORING SUPPL DEVI: 1.0000 | Freq: Every day | 0 refills | Status: AC

## 2024-05-28 MED ORDER — LANCET DEVICE MISC: 1.0000 | Freq: Every day | 0 refills | Status: AC

## 2024-05-28 NOTE — Progress Notes (Unsigned)
 Established patient visit   Patient: William Jimenez   DOB: 02-28-1966   58 y.o. Male  MRN: [id number] Visit Date: 05/28/2024  Today's healthcare provider: Carlean Charter, DO   Chief Complaint  Patient presents with   Diabetes   Hypertension   Subjective    HPI William Jimenez is a 58 year old male with type 2 diabetes who presents for medication management and follow-up.  He is currently on Ozempic  for type 2 diabetes management without side effects such as nausea, vomiting, abdominal pain, constipation, or diarrhea. He was previously on Byetta but switched to Ozempic  due to cost issues before obtaining Medicaid. He is concerned about his diet and medication dosage, questioning if a higher dose or different medication might be necessary for better control.  He experiences episodes of sudden weakness, particularly after physical exertion or exposure to heat, such as when he was outside holding a ladder for his son. During these episodes, he experiences sweating and a feeling of weakness, which improves after drinking Gatorade.  He is currently taking meloxicam  once daily for pain management and has been on it for about three years. He also takes tizanidine as a muscle relaxer, breaking the pill in half and taking it nightly before bed. He is on amlodipine  for blood pressure management and reports being low on this medication.  He has not received his prescribed vitamin D  supplements and does not recall receiving a prescription for it. He uses [facility] for his prescriptions.  He reports occasional shortness of breath when engaging in physical activities, which improves with rest.        Medications: Outpatient Medications Prior to Visit  Medication Sig   acetaminophen  (TYLENOL ) 500 MG tablet Take 500 mg by mouth as needed.   albuterol  (VENTOLIN  HFA) 108 (90 Base) MCG/ACT inhaler Inhale 2 puffs into the lungs every 6 (six) hours as needed for wheezing or shortness of breath.    atorvastatin  (LIPITOR) 40 MG tablet Take 40 mg by mouth daily.   carvedilol  (COREG ) 25 MG tablet Take 1 tablet (25 mg total) by mouth 2 (two) times daily with a meal.   empagliflozin  (JARDIANCE ) 10 MG TABS tablet Take 1 tablet (10 mg total) by mouth daily before breakfast.   furosemide  (LASIX ) 40 MG tablet TAKE 1 TABLET BY MOUTH ONCE DAILY -  MAY  TAKE  ADDITIONAL  TAB  ONLY  AS  NEEDED (Patient taking differently: Take 40 mg by mouth daily as needed.)   omeprazole  (PRILOSEC) 20 MG capsule Take 1 capsule (20 mg total) by mouth daily.   sacubitril -valsartan  (ENTRESTO ) 49-51 MG Take 1 tablet by mouth twice daily   sertraline  (ZOLOFT ) 50 MG tablet Take 1 tablet (50 mg total) by mouth daily.   spironolactone  (ALDACTONE ) 25 MG tablet Take 1 tablet by mouth once daily   [DISCONTINUED] Blood Glucose Monitoring Suppl DEVI 1 each by Does not apply route daily before breakfast. May substitute to any manufacturer covered by patient's insurance.   [DISCONTINUED] Glucose Blood (BLOOD GLUCOSE TEST STRIPS) STRP 1 each by In Vitro route daily before breakfast. May substitute to any manufacturer covered by patient's insurance.   [DISCONTINUED] Lancet Device MISC 1 each by Does not apply route daily before breakfast. May substitute to any manufacturer covered by patient's insurance.   [DISCONTINUED] Semaglutide , 1 MG/DOSE, (OZEMPIC , 1 MG/DOSE,) 4 MG/3ML SOPN Inject 1 mg into the skin once a week.   No facility-administered medications prior to visit.  Review of Systems  Respiratory:  Positive for shortness of breath (on exertion; currently at baseline). Negative for cough and wheezing.   Cardiovascular:  Negative for chest pain, palpitations and leg swelling.  Neurological:  Negative for weakness (yesterday after being in sun sweating; improved with gatorade) and headaches.        Objective    BP 132/85 (BP Location: Right Arm, Patient Position: Sitting, Cuff Size: Large)   Pulse 69   Resp 16   Ht 5'  11 (1.803 m)   Wt (!) 333 lb 12.8 oz (151.4 kg)   SpO2 97%   BMI 46.56 kg/m     Physical Exam Vitals reviewed.  Constitutional:      General: He is not in acute distress.    Appearance: Normal appearance. He is not diaphoretic.  HENT:     Head: Normocephalic and atraumatic.   Eyes:     General: No scleral icterus.    Conjunctiva/sclera: Conjunctivae normal.    Cardiovascular:     Rate and Rhythm: Normal rate and regular rhythm.     Pulses: Normal pulses.     Heart sounds: Normal heart sounds. No murmur heard. Pulmonary:     Effort: Pulmonary effort is normal. No respiratory distress.     Breath sounds: Normal breath sounds. No wheezing or rhonchi.   Musculoskeletal:     Cervical back: Neck supple.     Right lower leg: Edema (trace) present.     Left lower leg: Edema (trace) present.  Lymphadenopathy:     Cervical: No cervical adenopathy.   Skin:    General: Skin is warm and dry.     Findings: No rash.   Neurological:     Mental Status: He is alert and oriented to person, place, and time. Mental status is at baseline.   Psychiatric:        Mood and Affect: Mood normal.        Behavior: Behavior normal.      Results for orders placed or performed in visit on 05/28/24  POCT HgB A1C  Result Value Ref Range   Hemoglobin A1C 6.1 (A) 4.0 - 5.6 %   PT 128     Assessment & Plan    Type 2 diabetes mellitus with stage 2 chronic kidney disease, without long-term current use of insulin (HCC) -     POCT glycosylated hemoglobin (Hb A1C) -     Blood Glucose Monitoring Suppl; 1 each by Does not apply route daily before breakfast. May substitute to any manufacturer covered by patient's insurance.  Dispense: 1 each; Refill: 0 -     Blood Glucose Test; 1 each by In Vitro route daily before breakfast. May substitute to any manufacturer covered by patient's insurance.  Dispense: 100 strip; Refill: 3 -     Lancet Device; 1 each by Does not apply route daily before breakfast.  May substitute to any manufacturer covered by patient's insurance.  Dispense: 1 each; Refill: 0 -     Lancets Misc.; 1 each by Does not apply route daily before breakfast. May substitute to any manufacturer covered by patient's insurance.  Dispense: 100 each; Refill: 3 -     Semaglutide  (2 MG/DOSE); Inject 2 mg as directed once a week.  Dispense: 3 mL; Refill: 4  Morbid obesity (HCC)  Weight loss counseling, encounter for  Primary hypertension  Vitamin D  deficiency -     Vitamin D  (Ergocalciferol ); Take 1 capsule (50,000 Units total) by mouth every 7 (  seven) days.  Dispense: 12 capsule; Refill: 1      Type 2 Diabetes Mellitus with stage 2 chronic kidney disease A1c at 6.1%, well-controlled on Ozempic . No side effects. Consider [phone] for weight loss if needed. - Continue Ozempic , increase dose to 2 mg. - Consider Wegovy if no weight loss on highest Ozempic  dose. - Send prior authorization for [facility] if needed. - Ensure he has a blood glucose monitoring device and lancets.  Morbid obesity; weight loss counseling Encouraged patient to focus on restricting caloric intake to 2000 cal daily and ensure he is getting at least 30 to 60 minutes of moderate intensity exercise most days of the week.  Discussed that he will need to continue this, even if we do eventually start a weight loss medication.  He expressed understanding.  Hypertension Chronic, stable.  BP 132/85 mmHg, slightly elevated today. Weakness episodes possibly due to dehydration or exertion. - Ensure adequate hydration, especially in hot weather.  Vitamin D  Deficiency Has not received vitamin D  supplementation. Aim to normalize levels. - Send prescription for vitamin D  supplementation for six months. - Recheck vitamin D  levels in six months.  General Health Maintenance Completed Cologuard test last year. Eye exam normal last year. - Request Cologuard records to confirm completion. - Remind him to schedule next Cologuard  test in September next year.     Return in about 3 months (around 08/28/2024) for Chronic f/u, Weight, DM.      I discussed the assessment and treatment plan with the patient  The patient was provided an opportunity to ask questions and all were answered. The patient agreed with the plan and demonstrated an understanding of the instructions.   The patient was advised to call back or seek an in-person evaluation if the symptoms worsen or if the condition fails to improve as anticipated.    Carlean Charter, DO  [facility] [phone] (phone) [phone] (fax)  [facility]

## 2024-06-11 ENCOUNTER — Ambulatory Visit: Payer: Medicaid Other | Admitting: Cardiology

## 2024-06-21 ENCOUNTER — Ambulatory Visit: Attending: Cardiology | Admitting: Cardiology

## 2024-07-16 ENCOUNTER — Ambulatory Visit: Admitting: Cardiology

## 2024-08-04 ENCOUNTER — Other Ambulatory Visit: Payer: Self-pay | Admitting: Family Medicine

## 2024-08-04 ENCOUNTER — Other Ambulatory Visit: Payer: Self-pay | Admitting: Family

## 2024-08-04 DIAGNOSIS — K219 Gastro-esophageal reflux disease without esophagitis: Secondary | ICD-10-CM

## 2024-08-18 ENCOUNTER — Telehealth: Payer: Self-pay | Admitting: Family

## 2024-08-18 NOTE — Progress Notes (Unsigned)
 Advanced Heart Failure Clinic Note    PCP: [place] HERO, MD (last seen 02/25) Cardiologist: Redell Cave, MD (last seen 12/24)  Chief Complaint: shortness of breath  HPI:  William Jimenez is a 58 y/o male with a history of nonobstructive CAD by LHC in 12/2022, HFrEF secondary to NICM, frequent PVCs, HTN, HLD, prediabetse, obesity, chronic back pain, OSA and chronic heart failure.    First evaluated by Dr. Cave on 11/04/22 for shortness of breath and abdominal distension. He was started on Lasix  40mg  daily and his edema improved. He then noted abdominal bloating, continued shortness of breath, orthopnea and hypertension. He was started on Coreg  12.5 mg BID, spironolactone  25mg  daily and Lasix  was increased to 40mg  BID.   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 Following his LHC he was given IV lasix  followed by titration of oral furosemide  to 80mg  twice daily and lisinopril  was increased to 20mg  daily.   Holter monitor worn 02/04/23: Patient had a min HR of 49 bpm, max HR of 119 bpm, and avg HR of 73 bpm. Predominant underlying rhythm was Sinus Rhythm. No Isolated SVEs, SVE Couplets, or SVE Triplets were present. Isolated VEs were frequent (6.0%, 23231), VE Couplets were occasional (1.2%, 2290), and no VE Triplets were present. Ventricular Bigeminy was present. Conclusion: Frequent PVCs, 6% burden. No other significant arrhythmias.  Was in the ED 09/01/22 due to worsening shortness of breath on exertion and orthopnea x 2 weeks. IV diuresed. Elevated troponin due to demand ischemia in the setting of suspected CHF exacerbation and not ACS.   Echo 09/02/22: EF 30-35% with mild/ moderate pulmonary HTN Echo 03/28/23: EF 35-40% along with Grade II DD, aortic valve thickening without  evidence of narrowing  LHC 12/20/22: Mild-moderate single vessel coronary artery disease with 30-40% mid LAD lesion; question focal myocardial bridging.  No significant disease observed in the LCx or RCA.  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 Severely elevated left heart filling pressures (LVEDP 35-40 mmHg).  cMRI 07/23/23: 1. Moderately dilated LV, moderate to severely reduced systolic function. LVEF 32%.  2.  Small midwall basal inferolateral scar  3.  No evidence for infiltrative disease. 4.  Normal RV size and function.  5.  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  Echo 12/08/23: EF 40-45% with G1DD, normal RV, moderate LAE, mild dilatation of aortic root at 41 mm  He presents today for a HF f/u visit with a chief complaint of shortness of breath. Improving and no SOB upon walking to the clinic today. Has associated fatigue along with this. Denies chest pain, cough, palpitations, abdominal distention, pedal edema, dizziness, weight gain or difficulty sleeping. Reports sleeping  well on 2 pillows and wearing CPAP nightly. He does mention that he does get dizzy when he changes positions. Has not had to take any PRN furosemide  doses.   Using ozempic  once weekly.   ROS: All systems negative except as listed in HPI, PMH and Problem List.  SH:  Social History   Socioeconomic History   Marital status: Married    Spouse name: Not on file   Number of children: Not on file   Years of education: Not on file   Highest education level: Not on file  Occupational History   Not on file  Tobacco Use   Smoking status: Never  Smokeless tobacco: Current    Types: Snuff  Vaping Use   Vaping status: Never Used  Substance and Sexual Activity   Alcohol use: No   Drug use: No   Sexual activity: Yes    Birth control/protection: None  Other Topics Concern   Not on file  Social History Narrative   Not on file   Social Drivers of Health   Financial  Resource Strain: Low Risk  (05/28/2024)   Overall Financial Resource Strain (CARDIA)    Difficulty of Paying Living Expenses: Not hard at all  Food Insecurity: No Food Insecurity (05/28/2024)   Hunger Vital Sign    Worried About Running Out of Food in the Last Year: Never true    Ran Out of Food in the Last Year: Never true  Transportation Needs: No Transportation Needs (05/28/2024)   PRAPARE - Administrator, Civil Service (Medical): No    Lack of Transportation (Non-Medical): No  Physical Activity: Sufficiently Active (05/28/2024)   Exercise Vital Sign    Days of Exercise per Week: 7 days    Minutes of Exercise per Session: 30 min  Stress: No Stress Concern Present (05/28/2024)   William Jimenez [employer] Occupational [employer] - Occupational Stress Questionnaire    Feeling of Stress: Not at all  Social Connections: Unknown (05/28/2024)   Social Connection and Isolation Panel    Frequency of Communication with Friends and Family: Never    Frequency of Social Gatherings with Friends and Family: Not on file    Attends Religious Services: 1 to 4 times per year    Active Member of [employer] or Organizations: No    Attends Banker Meetings: Never    Marital Status: Married  Catering manager Violence: Not At Risk (05/28/2024)   Humiliation, Afraid, Rape, and Kick questionnaire    Fear of Current or Ex-Partner: No    Emotionally Abused: No    Physically Abused: No    Sexually Abused: No    FH:  Family History  Problem Relation Age of Onset   Kidney disease Mother    Heart disease Father    Prostate cancer Neg Hx    Bladder Cancer Neg Hx    Kidney cancer Neg Hx     Past Medical History:  Diagnosis Date   Anxiety    Arthritis    Chronic renal impairment, stage 2 (mild) 09/01/2022   Diabetes mellitus without complication (HCC)    GERD (gastroesophageal reflux disease)    HLD (hyperlipidemia)    Hypertension    Hypertensive emergency 04/07/2022   NICM (nonischemic  cardiomyopathy) (HCC)    Pre-diabetes 09/01/2022   Sleep apnea     Current Outpatient Medications  Medication Sig Dispense Refill   acetaminophen  (TYLENOL ) 500 MG tablet Take 500 mg by mouth as needed.     albuterol  (VENTOLIN  HFA) 108 (90 Base) MCG/ACT inhaler Inhale 2 puffs into the lungs every 6 (six) hours as needed for wheezing or shortness of breath. 8 g 2   atorvastatin  (LIPITOR) 40 MG tablet Take 40 mg by mouth daily.     Blood Glucose Monitoring Suppl DEVI 1 each by Does not apply route daily before breakfast. May substitute to any manufacturer covered by patient's insurance. 1 each 0   carvedilol  (COREG ) 25 MG tablet Take 1 tablet (25 mg total) by mouth 2 (two) times daily with a meal. 180 tablet 0   empagliflozin  (JARDIANCE ) 10 MG TABS tablet Take 1 tablet (10 mg total) by mouth  daily before breakfast. 30 tablet 11   furosemide  (LASIX ) 40 MG tablet TAKE 1 TABLET BY MOUTH ONCE DAILY - MAY TAKE ADDITIONAL TABLET ONLY AS NEEDED 30 tablet 0   Glucose Blood (BLOOD GLUCOSE TEST STRIPS) STRP 1 each by In Vitro route daily before breakfast. May substitute to any manufacturer covered by patient's insurance. 100 strip 3   Lancet Device MISC 1 each by Does not apply route daily before breakfast. May substitute to any manufacturer covered by patient's insurance. 1 each 0   omeprazole  (PRILOSEC) 20 MG capsule Take 1 capsule (20 mg total) by mouth daily. 90 capsule 1   sacubitril -valsartan  (ENTRESTO ) 49-51 MG Take 1 tablet by mouth twice daily 60 tablet 3   Semaglutide , 2 MG/DOSE, 8 MG/3ML SOPN Inject 2 mg as directed once a week. 3 mL 4   sertraline  (ZOLOFT ) 50 MG tablet Take 1 tablet (50 mg total) by mouth daily.     spironolactone  (ALDACTONE ) 25 MG tablet Take 1 tablet by mouth once daily 90 tablet 1   Vitamin D , Ergocalciferol , (DRISDOL ) 1.25 MG (50000 UNIT) CAPS capsule Take 1 capsule (50,000 Units total) by mouth every 7 (seven) days. 12 capsule 1   No current facility-administered  medications for this visit.   There were no vitals filed for this visit.  Wt Readings from Last 3 Encounters:  05/28/24 (!) 333 lb 12.8 oz (151.4 kg)  02/26/24 (!) 333 lb 6.4 oz (151.2 kg)  02/17/24 (!) 334 lb (151.5 kg)   Lab Results  Component Value Date   CREATININE 1.22 02/26/2024   CREATININE 1.27 12/12/2023   CREATININE 1.50 (H) 07/03/2023    PHYSICAL EXAM:  General: Well appearing. No resp difficulty HEENT: normal Neck: supple, no JVD Cor: Regular rhythm, rate. No rubs, gallops or murmurs Lungs: clear Abdomen: soft, nontender, nondistended. Extremities: no cyanosis, clubbing, rash, edema Neuro: alert & oriented X 3. Moves all 4 extremities w/o difficulty. Affect pleasant   ECG: not done   ASSESSMENT & PLAN:  1: Chronic NICM with reduced ejection fraction- - suspect due to sleep apnea/ HTN as cath showed nonobstructive CAD - NYHA class II - euvolemic - weighing daily; reminded to call for overnight weight gain of > 2 pounds or a weekly weight gain of > 5 pounds - weight down 13 pounds from last visit here 5 months ago - Echo 09/02/22: EF 30-35% with mild/ moderate pulmonary HTN - Echo 03/28/23: EF 35-40% along with Grade II DD, aortic valve thickening without evidence of narrowing - cMRI 07/23/23: 1. Moderately dilated LV, moderate to severely reduced systolic function. LVEF 32%.  2.  Small midwall basal inferolateral scar  3.  No evidence for infiltrative disease. 4.  Normal RV size and function.  5.  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 - Echo 12/08/23: EF 40-45% with G1DD, normal RV, moderate LAE, mild dilatation of aortic root at 41 mm - not adding salt to his food - continue carvedilol  25mg  BID - continue jardiance  10mg  daily - change furosemide  to PRN only and hopefully that will help lessen dizziness; encouraged slow position changes as well - continue entresto  to 97/103mg  BID - continue spironolactone  25mg  daily - BNP 07/03/23  was 23.5  2: HTN- - BP 112/75 - sees PCP Joshua) @ [facility]; is planning on getting established at [facility] next week - BMP 12/12/23 reviewed and showed sodium 141, potassium 4.5, creatinine 1.27 & GFR 67  3: Nonobstructive CAD- - LHC 12/20/22:   Mild-moderate  single vessel coronary artery disease with 30-40% mid LAD lesion; question focal myocardial bridging.  No significant disease observed in the LCx or RCA.  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   Severely elevated left heart filling pressures (LVEDP 35-40 mmHg). - saw cardiology Berry) 12/24 - continue atorvastatin  20mg  daily - LDL 09/08/23 reviewed and was 99  4: Sleep apnea- - wearing CPAP nightly and says that he's sleeping well   Return in 6 months, sooner if needed  Ellouise DELENA Class, FNP 08/18/24

## 2024-08-18 NOTE — Telephone Encounter (Signed)
 Called to confirm/remind patient of their appointment at the Advanced Heart Failure Clinic on 08/19/24.   Appointment:   [] Confirmed  [x] Left mess   [] No answer/No voice mail  [] VM Full/unable to leave message  [] Phone not in service  Patient reminded to bring all medications and/or complete list.  Confirmed patient has transportation. Gave directions, instructed to utilize valet parking.

## 2024-08-19 ENCOUNTER — Ambulatory Visit: Payer: Self-pay | Admitting: Family

## 2024-08-19 ENCOUNTER — Ambulatory Visit: Admitting: Family

## 2024-08-19 ENCOUNTER — Encounter: Payer: Self-pay | Admitting: Family

## 2024-08-19 ENCOUNTER — Other Ambulatory Visit
Admission: RE | Admit: 2024-08-19 | Discharge: 2024-08-19 | Disposition: A | Discharge status: Home / Self Care | Source: Ambulatory Visit | Attending: Family | Admitting: Family

## 2024-08-19 VITALS — BP 134/85 | HR 81 | Wt 331.0 lb

## 2024-08-19 DIAGNOSIS — I10 Essential (primary) hypertension: Secondary | ICD-10-CM | POA: Diagnosis not present

## 2024-08-19 DIAGNOSIS — I2510 Atherosclerotic heart disease of native coronary artery without angina pectoris: Secondary | ICD-10-CM

## 2024-08-19 DIAGNOSIS — G4733 Obstructive sleep apnea (adult) (pediatric): Secondary | ICD-10-CM | POA: Diagnosis not present

## 2024-08-19 DIAGNOSIS — I5022 Chronic systolic (congestive) heart failure: Secondary | ICD-10-CM | POA: Diagnosis present

## 2024-08-19 LAB — BASIC METABOLIC PANEL WITH GFR
Anion gap: 7 (ref 5–15)
BUN: 15 mg/dL (ref 6–20)
CO2: 29 mmol/L (ref 22–32)
Calcium: 8.6 mg/dL — ABNORMAL LOW (ref 8.9–10.3)
Chloride: 104 mmol/L (ref 98–111)
Creatinine, Ser: 1.18 mg/dL (ref 0.61–1.24)
GFR, Estimated: >60 mL/min (ref >60)
Glucose, Bld: 81 mg/dL (ref 70–99)
Potassium: 4.2 mmol/L (ref 3.5–5.1)
Sodium: 140 mmol/L (ref 135–145)

## 2024-08-19 LAB — LIPID PANEL
Cholesterol: 141 mg/dL (ref 0–200)
HDL: 46 mg/dL (ref >40)
LDL Cholesterol: 85 mg/dL (ref 0–99)
Total CHOL/HDL Ratio: 3.1 ratio
Triglycerides: 51 mg/dL (ref <150)
VLDL: 10 mg/dL (ref 0–40)

## 2024-08-19 MED ORDER — CARVEDILOL 25 MG PO TABS: 25.0000 mg | ORAL_TABLET | Freq: Two times a day (BID) | ORAL | 3 refills | Status: AC

## 2024-08-19 MED ORDER — SACUBITRIL-VALSARTAN 97-103 MG PO TABS: 1.0000 | ORAL_TABLET | Freq: Two times a day (BID) | ORAL | 5 refills | Status: DC

## 2024-08-19 MED ORDER — SPIRONOLACTONE 25 MG PO TABS: 25.0000 mg | ORAL_TABLET | Freq: Every day | ORAL | 3 refills | Status: DC

## 2024-08-19 MED ORDER — FUROSEMIDE 40 MG PO TABS: 40.0000 mg | ORAL_TABLET | Freq: Every day | ORAL | 3 refills | Status: DC

## 2024-08-19 NOTE — Patient Instructions (Signed)
 Medication Changes:  INCREASE ENTRESTO  97-103 MG TWICE DAILY   Lab Work:  Go over to the [facility]. Go pass the gift shop and have your blood work completed.  We will only call you if the results are abnormal or if the provider would like to make medication changes.  No news is good news.   Follow-Up in: 1 MONTH WITH ELLOUISE CLASS, FNP.   Thank you for choosing [facility].    At the Advanced Heart Failure Clinic, you and your health needs are our priority. We have a designated team specialized in the treatment of Heart Failure. This Care Team includes your primary Heart Failure Specialized Cardiologist (physician), Advanced Practice Providers (APPs- Physician Assistants and Nurse Practitioners), and Pharmacist who all work together to provide you with the care you need, when you need it.   You may see any of the following providers on your designated Care Team at your next follow up:  Dr. Toribio Fuel Dr. Ezra Shuck Dr. Ria Commander Dr. Morene Brownie ELLOUISE CLASS, FNP Jaun Bash, RPH-CPP  Please be sure to bring in all your medications bottles to every appointment.   Need to Contact Us :  If you have any questions or concerns before your next appointment please send us  a message through [place] or call our office at [phone].    TO LEAVE A MESSAGE FOR THE NURSE SELECT OPTION 2, PLEASE LEAVE A MESSAGE INCLUDING: YOUR NAME DATE OF BIRTH CALL BACK NUMBER REASON FOR CALL**this is important as we prioritize the call backs  YOU WILL RECEIVE A CALL BACK THE SAME DAY AS LONG AS YOU CALL BEFORE 4:00 PM

## 2024-08-22 ENCOUNTER — Other Ambulatory Visit: Payer: Self-pay | Admitting: Family Medicine

## 2024-08-22 DIAGNOSIS — K219 Gastro-esophageal reflux disease without esophagitis: Secondary | ICD-10-CM

## 2024-08-26 ENCOUNTER — Other Ambulatory Visit: Payer: Self-pay | Admitting: Family

## 2024-08-26 MED ORDER — ATORVASTATIN CALCIUM 80 MG PO TABS: 80.0000 mg | ORAL_TABLET | Freq: Every day | ORAL | 3 refills | Status: AC

## 2024-08-27 ENCOUNTER — Ambulatory Visit: Attending: Cardiology | Admitting: Cardiology

## 2024-08-27 VITALS — BP 110/74 | HR 82 | Ht 71.0 in | Wt 330.8 lb

## 2024-08-27 DIAGNOSIS — I10 Essential (primary) hypertension: Secondary | ICD-10-CM | POA: Diagnosis present

## 2024-08-27 DIAGNOSIS — E7800 Pure hypercholesterolemia, unspecified: Secondary | ICD-10-CM | POA: Diagnosis present

## 2024-08-27 DIAGNOSIS — I428 Other cardiomyopathies: Secondary | ICD-10-CM | POA: Insufficient documentation

## 2024-08-27 NOTE — Patient Instructions (Signed)
 Medication Instructions:  Your physician recommends that you continue on your current medications as directed. Please refer to the Current Medication list given to you today.   *If you need a refill on your cardiac medications before your next appointment, please call your pharmacy*  Lab Work: No labs ordered today  If you have labs (blood work) drawn today and your tests are completely normal, you will receive your results only by: MyChart Message (if you have MyChart) OR A paper copy in the mail If you have any lab test that is abnormal or we need to change your treatment, we will call you to review the results.  Testing/Procedures: Your physician has requested that you have an echocardiogram. Echocardiography is a painless test that uses sound waves to create images of your heart. It provides your doctor with information about the size and shape of your heart and how well your heart's chambers and valves are working.   You may receive an ultrasound enhancing agent through an IV if needed to better visualize your heart during the echo. This procedure takes approximately one hour.  There are no restrictions for this procedure.  This will take place at [address] [facility] [address] [facility]) [address], [place] [address]  Please note: We ask at that you not bring children with you during ultrasound (echo/ vascular) testing. Due to room size and safety concerns, children are not allowed in the ultrasound rooms during exams. Our front office staff cannot provide observation of children in our lobby area while testing is being conducted. An adult accompanying a patient to their appointment will only be allowed in the ultrasound room at the discretion of the ultrasound technician under special circumstances. We apologize for any inconvenience.   Follow-Up: At [facility], you and your health needs are our priority.  As part of our continuing mission to provide you with exceptional heart  care, our providers are all part of one team.  This team includes your primary Cardiologist (physician) and Advanced Practice Providers or APPs (Physician Assistants and Nurse Practitioners) who all work together to provide you with the care you need, when you need it.  Your next appointment:   1 year(s)  Provider:   You may see Redell Cave, MD or one of the following Advanced Practice Providers on your designated Care Team:   Lonni Meager, NP Lesley Maffucci, PA-C Bernardino Bring, PA-C Cadence [place], PA-C Tylene Lunch, NP Barnie Hila, NP    We recommend signing up for the patient portal called MyChart.  Sign up information is provided on this After Visit Summary.  MyChart is used to connect with patients for Virtual Visits (Telemedicine).  Patients are able to view lab/test results, encounter notes, upcoming appointments, etc.  Non-urgent messages can be sent to your provider as well.   To learn more about what you can do with MyChart, go to [email or web address].

## 2024-08-27 NOTE — Progress Notes (Signed)
 Cardiology Office Note:    Date:  08/27/2024   ID:  William Jimenez, DOB 1965-12-24, MRN [id number]  PCP:  Donzella Lauraine SAILOR, DO   [facility] Providers Cardiologist:  Redell Cave, MD     Referring MD: [place] HERO, MD   Chief Complaint  Patient presents with   Follow-up    NICM (nonischemic cardiomyopathy) Chronic systolic heart failure     History of Present Illness:    William Jimenez is a 58 y.o. male with a hx of nonischemic cardiomyopathy (initial EF 30 to 35%, last EF 40-45%), hypertension, hyperlipidemia, OSA who presents for follow-up.  Being seen for nonischemic cardiomyopathy and hypertension.  Entresto  increased after last visit.  BP has been adequately controlled, repeat echocardiogram was obtained 11/2023 showing improvement in EF now 40-45%.  Patient feels well, denies chest pain or shortness of breath, has been eating healthier, has lost some weight.  Tolerating medications, no adverse effects.  Prior notes CMR 8/24 EF 32%, small mid wall inferior scar.  Echo 4/24 EF 35 to 40% Left heart cath 1/24 mild mid LAD stenosis 35%   Past Medical History:  Diagnosis Date   Anxiety    Arthritis    Chronic renal impairment, stage 2 (mild) 09/01/2022   Diabetes mellitus without complication (HCC)    GERD (gastroesophageal reflux disease)    HLD (hyperlipidemia)    Hypertension    Hypertensive emergency 04/07/2022   NICM (nonischemic cardiomyopathy) (HCC)    Pre-diabetes 09/01/2022   Sleep apnea     Past Surgical History:  Procedure Laterality Date   CYSTOSCOPY     LEFT HEART CATH AND CORONARY ANGIOGRAPHY Left 12/20/2022   Procedure: LEFT HEART CATH AND CORONARY ANGIOGRAPHY;  Surgeon: Mady Bruckner, MD;  Location: [facility] INVASIVE CV LAB;  Service: Cardiovascular;  Laterality: Left;    Current Medications: Current Meds  Medication Sig   acetaminophen  (TYLENOL ) 500 MG tablet Take 500 mg by mouth as needed.   albuterol  (VENTOLIN  HFA) 108 (90  Base) MCG/ACT inhaler Inhale 2 puffs into the lungs every 6 (six) hours as needed for wheezing or shortness of breath.   atorvastatin  (LIPITOR) 80 MG tablet Take 1 tablet (80 mg total) by mouth daily.   Blood Glucose Monitoring Suppl DEVI 1 each by Does not apply route daily before breakfast. May substitute to any manufacturer covered by patient's insurance.   carvedilol  (COREG ) 25 MG tablet Take 1 tablet (25 mg total) by mouth 2 (two) times daily with a meal.   empagliflozin  (JARDIANCE ) 10 MG TABS tablet Take 1 tablet (10 mg total) by mouth daily before breakfast.   furosemide  (LASIX ) 40 MG tablet Take 1 tablet (40 mg total) by mouth daily. And additional dose if needed.   Glucose Blood (BLOOD GLUCOSE TEST STRIPS) STRP 1 each by In Vitro route daily before breakfast. May substitute to any manufacturer covered by patient's insurance.   Lancet Device MISC 1 each by Does not apply route daily before breakfast. May substitute to any manufacturer covered by patient's insurance.   omeprazole  (PRILOSEC) 20 MG capsule Take 1 capsule by mouth once daily   sacubitril -valsartan  (ENTRESTO ) 97-103 MG Take 1 tablet by mouth 2 (two) times daily.   Semaglutide , 2 MG/DOSE, 8 MG/3ML SOPN Inject 2 mg as directed once a week.   sertraline  (ZOLOFT ) 50 MG tablet Take 1 tablet (50 mg total) by mouth daily.   spironolactone  (ALDACTONE ) 25 MG tablet Take 1 tablet (25 mg total) by mouth daily.  Vitamin D , Ergocalciferol , (DRISDOL ) 1.25 MG (50000 UNIT) CAPS capsule Take 1 capsule (50,000 Units total) by mouth every 7 (seven) days.     Allergies:   Patient has no known allergies.   Social History   Socioeconomic History   Marital status: Married    Spouse name: Not on file   Number of children: Not on file   Years of education: Not on file   Highest education level: Not on file  Occupational History   Not on file  Tobacco Use   Smoking status: Never   Smokeless tobacco: Current    Types: Snuff  Vaping Use    Vaping status: Never Used  Substance and Sexual Activity   Alcohol use: No   Drug use: No   Sexual activity: Yes    Birth control/protection: None  Other Topics Concern   Not on file  Social History Narrative   Not on file   Social Drivers of Health   Financial Resource Strain: Low Risk  (05/28/2024)   Overall Financial Resource Strain (CARDIA)    Difficulty of Paying Living Expenses: Not hard at all  Food Insecurity: No Food Insecurity (05/28/2024)   Hunger Vital Sign    Worried About Running Out of Food in the Last Year: Never true    Ran Out of Food in the Last Year: Never true  Transportation Needs: No Transportation Needs (05/28/2024)   PRAPARE - Administrator, Civil Service (Medical): No    Lack of Transportation (Non-Medical): No  Physical Activity: Sufficiently Active (05/28/2024)   Exercise Vital Sign    Days of Exercise per Week: 7 days    Minutes of Exercise per Session: 30 min  Stress: No Stress Concern Present (05/28/2024)   [employer] of Occupational Health - Occupational Stress Questionnaire    Feeling of Stress: Not at all  Social Connections: Unknown (05/28/2024)   Social Connection and Isolation Panel    Frequency of Communication with Friends and Family: Never    Frequency of Social Gatherings with Friends and Family: Not on file    Attends Religious Services: 1 to 4 times per year    Active Member of [employer] or Organizations: No    Attends Banker Meetings: Never    Marital Status: Married     Family History: The patient's family history includes Heart disease in his father; Kidney disease in his mother. There is no history of Prostate cancer, Bladder Cancer, or Kidney cancer.  ROS:   Please see the history of present illness.     All other systems reviewed and are negative.  EKGs/Labs/Other Studies Reviewed:    The following studies were reviewed today:  EKG Interpretation Date/Time:  Friday August 27 2024 09:44:41  EDT Ventricular Rate:  82 PR Interval:  206 QRS Duration:  96 QT Interval:  356 QTC Calculation: 415 R Axis:   8  Text Interpretation: Sinus rhythm with frequent and consecutive Premature ventricular complexes Confirmed by Darliss Rogue ([id number]) on 08/27/2024 9:56:21 AM    Recent Labs: 02/26/2024: ALT 19 08/19/2024: BUN 15; Creatinine, Ser 1.18; Potassium 4.2; Sodium 140  Recent Lipid Panel    Component Value Date/Time   CHOL 141 08/19/2024 1511   CHOL 168 09/08/2023 1431   TRIG 51 08/19/2024 1511   HDL 46 08/19/2024 1511   HDL 52 09/08/2023 1431   CHOLHDL 3.1 08/19/2024 1511   VLDL 10 08/19/2024 1511   LDLCALC 85 08/19/2024 1511   LDLCALC 99 09/08/2023 1431  Risk Assessment/Calculations:             Physical Exam:    VS:  BP 110/74 (BP Location: Left Arm, Patient Position: Sitting, Cuff Size: Large)   Pulse 82   Ht 5' 11 (1.803 m)   Wt (!) 330 lb 12.8 oz (150 kg)   SpO2 97%   BMI 46.14 kg/m     Wt Readings from Last 3 Encounters:  08/27/24 (!) 330 lb 12.8 oz (150 kg)  08/19/24 (!) 331 lb (150.1 kg)  05/28/24 (!) 333 lb 12.8 oz (151.4 kg)     GEN:  Well nourished, well developed in no acute distress HEENT: Normal NECK: No JVD; No carotid bruits CARDIAC: RRR, no murmurs, rubs, gallops RESPIRATORY: Diminished breath sounds at bases, no wheezing ABDOMEN: Soft, non-tender, abdominal distention MUSCULOSKELETAL:  No edema; No deformity  SKIN: Warm and dry NEUROLOGIC:  Alert and oriented x 3 PSYCHIATRIC:  Normal affect   ASSESSMENT:    1. NICM (nonischemic cardiomyopathy) (HCC)   2. Primary hypertension   3. Pure hypercholesterolemia    PLAN:    In order of problems listed above:  Nonischemic cardiomyopathy, initial EF 35%,  etiology possibly uncontrolled hypertension.  Repeat echo 11/2023 EF 40 to 45%.  Continue Entresto  97-103 mg twice daily, Coreg  25 mg twice daily, Aldactone  25 mg daily.  Continue Lasix  40 mg daily.   Hypertension BP  controlled.  Continue Entresto , Coreg  25 mg twice daily, Aldactone  25 mg daily. Hyperlipidemia, cholesterol controlled.  Continue Lipitor 20 mg daily.    Follow-up in 12 months     Medication Adjustments/Labs and Tests Ordered: Current medicines are reviewed at length with the patient today.  Concerns regarding medicines are outlined above.  Orders Placed This Encounter  Procedures   EKG 12-Lead   ECHOCARDIOGRAM LIMITED   No orders of the defined types were placed in this encounter.   Patient Instructions  Medication Instructions:  Your physician recommends that you continue on your current medications as directed. Please refer to the Current Medication list given to you today.   *If you need a refill on your cardiac medications before your next appointment, please call your pharmacy*  Lab Work: No labs ordered today  If you have labs (blood work) drawn today and your tests are completely normal, you will receive your results only by: MyChart Message (if you have MyChart) OR A paper copy in the mail If you have any lab test that is abnormal or we need to change your treatment, we will call you to review the results.  Testing/Procedures: Your physician has requested that you have an echocardiogram. Echocardiography is a painless test that uses sound waves to create images of your heart. It provides your doctor with information about the size and shape of your heart and how well your heart's chambers and valves are working.   You may receive an ultrasound enhancing agent through an IV if needed to better visualize your heart during the echo. This procedure takes approximately one hour.  There are no restrictions for this procedure.  This will take place at [address] [facility] [address]) [address], [place] [address]  Please note: We ask at that you not bring children with you during ultrasound (echo/ vascular) testing. Due to room size and safety concerns, children are not  allowed in the ultrasound rooms during exams. Our front office staff cannot provide observation of children in our lobby area while testing is being conducted. An adult accompanying a  patient to their appointment will only be allowed in the ultrasound room at the discretion of the ultrasound technician under special circumstances. We apologize for any inconvenience.   Follow-Up: At [facility], you and your health needs are our priority.  As part of our continuing mission to provide you with exceptional heart care, our providers are all part of one team.  This team includes your primary Cardiologist (physician) and Advanced Practice Providers or APPs (Physician Assistants and Nurse Practitioners) who all work together to provide you with the care you need, when you need it.  Your next appointment:   1 year(s)  Provider:   You may see Redell Cave, MD or one of the following Advanced Practice Providers on your designated Care Team:   Lonni Meager, NP Lesley Maffucci, PA-C Bernardino Bring, PA-C Cadence [place], PA-C Tylene Lunch, NP Barnie Hila, NP    We recommend signing up for the patient portal called MyChart.  Sign up information is provided on this After Visit Summary.  MyChart is used to connect with patients for Virtual Visits (Telemedicine).  Patients are able to view lab/test results, encounter notes, upcoming appointments, etc.  Non-urgent messages can be sent to your provider as well.   To learn more about what you can do with MyChart, go to [email or web address].            Signed, Redell Cave, MD  08/27/2024 11:47 AM     [facility]

## 2024-09-15 ENCOUNTER — Telehealth: Payer: Self-pay | Admitting: Family

## 2024-09-16 ENCOUNTER — Telehealth: Payer: Self-pay

## 2024-09-16 ENCOUNTER — Other Ambulatory Visit (HOSPITAL_COMMUNITY): Payer: Self-pay

## 2024-09-16 MED ORDER — ENTRESTO 97-103 MG PO TABS: 1.0000 | ORAL_TABLET | Freq: Two times a day (BID) | ORAL | 5 refills | Status: AC

## 2024-09-16 NOTE — Telephone Encounter (Signed)
 Insurance prefers brand Entresto , copay $4. Resending prescription.

## 2024-09-16 NOTE — Telephone Encounter (Signed)
 Advanced [facility] Failure Patient Advocate Encounter  Test billing for Entresto  shows that this plan only covers DAW 1 - do not substitute. Copay $4 for 90 days. Generic is not currently covered on this plan.  Rachel DEL, CPhT Rx Patient Advocate Phone: [phone]

## 2024-09-22 ENCOUNTER — Telehealth: Payer: Self-pay | Admitting: Family

## 2024-09-22 NOTE — Telephone Encounter (Signed)
 Called to confirm/remind patient of their appointment at the [facility] on 09/23/24.   Appointment:   [] Confirmed  [x] Left mess   [] No answer/No voice mail  [] VM Full/unable to leave message  [] Phone not in service  Patient reminded to bring all medications and/or complete list.  Confirmed patient has transportation. Gave directions, instructed to utilize valet parking.

## 2024-09-22 NOTE — Progress Notes (Unsigned)
 [facility] Note    PCP: [place] HERO, MD  Cardiologist: Redell Cave, MD  Chief Complaint: fatigue   HPI:  William Jimenez is a 58 y/o male with a history of nonobstructive CAD by LHC in 12/2022, HFrEF secondary to NICM, frequent PVCs, HTN, HLD, prediabetse, obesity, chronic back pain, OSA and chronic heart failure.    Was in the ED 09/01/22 due to worsening shortness of breath on exertion and orthopnea x 2 weeks. IV diuresed. Elevated troponin due to demand ischemia in the setting of suspected CHF exacerbation and not ACS. Echo 09/02/22: EF 30-35% with mild/ moderate pulmonary HTN  First evaluated by Dr. Cave on 11/04/22 for shortness of breath and abdominal distension. He was started on Lasix  40mg  daily and his edema improved. He then noted abdominal bloating, continued shortness of breath, orthopnea and hypertension. He was started on Coreg  12.5 mg BID, spironolactone  25mg  daily and Lasix  was increased to 40mg  BID.   On 12/20/22 he underwent left heart catheterization that indicated mild-moderate single vessel coronary artery disease with 30-40% mid LAD lesion, LVEDP was severely elevated at 35-56mmHg, findings were consistent with non-ischemic cardiomyopathy. Following his LHC he was given IV lasix  followed by titration of oral furosemide  to 80mg  twice daily and lisinopril  was increased to 20mg  daily.   Holter monitor worn 02/04/23: Patient had a min HR of 49 bpm, max HR of 119 bpm, and avg HR of 73 bpm. Predominant underlying rhythm was Sinus Rhythm. No Isolated SVEs, SVE Couplets, or SVE Triplets were present. Isolated VEs were frequent (6.0%, 23231), VE Couplets were occasional (1.2%, 2290), and no VE Triplets were present. Ventricular Bigeminy was present. Conclusion: Frequent PVCs, 6% burden. No other significant arrhythmias.  Echo 03/28/23: EF 35-40% along with Grade II DD, aortic valve thickening without evidence of narrowing  cMRI 07/23/23: 1. Moderately  dilated LV, moderate to severely reduced systolic function. LVEF 32%.  2.  Small midwall basal inferolateral scar  3.  No evidence for infiltrative disease. 4.  Normal RV size and function.  5.  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  Echo 12/08/23: EF 40-45% with G1DD, normal RV, moderate LAE, mild dilatation of aortic root at 41 mm  He presents today for a HF f/u visit with a chief complaint of minimal fatigue. Has associated intermittent palpitations. Sleeping well with CPAP. Has recently had ozempic  increased to 2mg  weekly. Overall he's doing well and doesn't notice any issues with the increased dose of entresto .   ROS: All systems negative except as listed in HPI, PMH and Problem List.  SH:  Social History   Socioeconomic History   Marital status: Married    Spouse name: Not on file   Number of children: Not on file   Years of education: Not on file   Highest education level: Not on file  Occupational History   Not on file  Tobacco Use   Smoking status: Never   Smokeless tobacco: Current    Types: Snuff  Vaping Use   Vaping status: Never Used  Substance and Sexual Activity   Alcohol use: No   Drug use: No   Sexual activity: Yes    Birth control/protection: None  Other Topics Concern   Not on file  Social History Narrative   Not on file   Social Drivers of Health   Financial Resource Strain: Low Risk  (05/28/2024)   Overall Financial Resource Strain (CARDIA)    Difficulty of Paying Living  Expenses: Not hard at all  Food Insecurity: No Food Insecurity (05/28/2024)   Hunger Vital Sign    Worried About Running Out of Food in the Last Year: Never true    Ran Out of Food in the Last Year: Never true  Transportation Needs: No Transportation Needs (05/28/2024)   PRAPARE - Administrator, Civil Service (Medical): No    Lack of Transportation (Non-Medical): No  Physical Activity: Sufficiently Active (05/28/2024)   Exercise Vital Sign     Days of Exercise per Week: 7 days    Minutes of Exercise per Session: 30 min  Stress: No Stress Concern Present (05/28/2024)   [employer] of Occupational Health - Occupational Stress Questionnaire    Feeling of Stress: Not at all  Social Connections: Unknown (05/28/2024)   Social Connection and Isolation Panel    Frequency of Communication with Friends and Family: Never    Frequency of Social Gatherings with Friends and Family: Not on file    Attends Religious Services: 1 to 4 times per year    Active Member of [employer] or Organizations: No    Attends Banker Meetings: Never    Marital Status: Married  Catering manager Violence: Not At Risk (05/28/2024)   Humiliation, Afraid, Rape, and Kick questionnaire    Fear of Current or Ex-Partner: No    Emotionally Abused: No    Physically Abused: No    Sexually Abused: No    FH:  Family History  Problem Relation Age of Onset   Kidney disease Mother    Heart disease Father    Prostate cancer Neg Hx    Bladder Cancer Neg Hx    Kidney cancer Neg Hx     Past Medical History:  Diagnosis Date   Anxiety    Arthritis    Chronic renal impairment, stage 2 (mild) 09/01/2022   Diabetes mellitus without complication (HCC)    GERD (gastroesophageal reflux disease)    HLD (hyperlipidemia)    Hypertension    Hypertensive emergency 04/07/2022   NICM (nonischemic cardiomyopathy) (HCC)    Pre-diabetes 09/01/2022   Sleep apnea     Current Outpatient Medications  Medication Sig Dispense Refill   acetaminophen  (TYLENOL ) 500 MG tablet Take 500 mg by mouth as needed.     albuterol  (VENTOLIN  HFA) 108 (90 Base) MCG/ACT inhaler Inhale 2 puffs into the lungs every 6 (six) hours as needed for wheezing or shortness of breath. 8 g 2   atorvastatin  (LIPITOR) 80 MG tablet Take 1 tablet (80 mg total) by mouth daily. 90 tablet 3   Blood Glucose Monitoring Suppl DEVI 1 each by Does not apply route daily before breakfast. May substitute to any  manufacturer covered by patient's insurance. 1 each 0   carvedilol  (COREG ) 25 MG tablet Take 1 tablet (25 mg total) by mouth 2 (two) times daily with a meal. 180 tablet 3   empagliflozin  (JARDIANCE ) 10 MG TABS tablet Take 1 tablet (10 mg total) by mouth daily before breakfast. 30 tablet 11   ENTRESTO  97-103 MG Take 1 tablet by mouth 2 (two) times daily. 60 tablet 5   furosemide  (LASIX ) 40 MG tablet Take 1 tablet (40 mg total) by mouth daily. And additional dose if needed. 100 tablet 3   Glucose Blood (BLOOD GLUCOSE TEST STRIPS) STRP 1 each by In Vitro route daily before breakfast. May substitute to any manufacturer covered by patient's insurance. 100 strip 3   Lancet Device MISC 1 each by Does  not apply route daily before breakfast. May substitute to any manufacturer covered by patient's insurance. 1 each 0   omeprazole  (PRILOSEC) 20 MG capsule Take 1 capsule by mouth once daily 90 capsule 0   Semaglutide , 2 MG/DOSE, 8 MG/3ML SOPN Inject 2 mg as directed once a week. 3 mL 4   sertraline  (ZOLOFT ) 50 MG tablet Take 1 tablet (50 mg total) by mouth daily.     spironolactone  (ALDACTONE ) 25 MG tablet Take 1 tablet (25 mg total) by mouth daily. 90 tablet 3   Vitamin D , Ergocalciferol , (DRISDOL ) 1.25 MG (50000 UNIT) CAPS capsule Take 1 capsule (50,000 Units total) by mouth every 7 (seven) days. 12 capsule 1   No current facility-administered medications for this visit.   Vitals:   09/23/24 1337  BP: 116/79  Pulse: 82  SpO2: 96%  Weight: (!) 337 lb 12.8 oz (153.2 kg)   Wt Readings from Last 3 Encounters:  09/23/24 (!) 337 lb 12.8 oz (153.2 kg)  08/27/24 (!) 330 lb 12.8 oz (150 kg)  08/19/24 (!) 331 lb (150.1 kg)   Lab Results  Component Value Date   CREATININE 1.18 08/19/2024   CREATININE 1.22 02/26/2024   CREATININE 1.27 12/12/2023    PHYSICAL EXAM:  General: Well appearing.  Cor: No JVD. Regular rhythm, rate.  Lungs: clear Abdomen: soft, nontender, nondistended. Extremities: no  edema Neuro:. Affect pleasant   ECG: not done   ASSESSMENT & PLAN:  1: NICM with reduced ejection fraction- - suspect due to sleep apnea/ HTN as cath showed nonobstructive CAD - NYHA Jimenez II - euvolemic - weight up 6 pounds from last visit here 1 month ago - Echo 09/02/22: EF 30-35% with mild/ moderate pulmonary HTN - Echo 03/28/23: EF 35-40% along with Grade II DD, aortic valve thickening without evidence of narrowing - cMRI 07/23/23: 1. Moderately dilated LV, moderate to severely reduced systolic function. LVEF 32%.  2.  Small midwall basal inferolateral scar  3.  No evidence for infiltrative disease. 4.  Normal RV size and function.  5.  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 - Echo 12/08/23: EF 40-45% with G1DD, normal RV, moderate LAE, mild dilatation of aortic root at 41 mm - not adding salt to his food - continue carvedilol  25mg  BID - continue jardiance  10mg  daily - continue furosemide  40mg  daily and additional dose PRN (hasn't taken any PRN doses)  - continue entresto  97/103mg  BID. BMET today. - continue spironolactone  25mg  daily - BNP 07/03/23 was 23.5  2: HTN- - BP 116/79 - saw PCP (Pardue) 06/25 - BMP 08/19/24 reviewed: sodium 140, potassium 4.2, creatinine 1.18 & GFR >60.  - BMET today  3: Nonobstructive CAD- - LHC 12/20/22:   Mild-moderate single vessel coronary artery disease with 30-40% mid LAD lesion; question focal myocardial bridging.  No significant disease observed in the LCx or RCA.  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   Severely elevated left heart filling pressures (LVEDP 35-40 mmHg). - saw cardiology (Agbor-Etang) 09/25  4: Sleep apnea- - wearing CPAP nightly and says that he's sleeping well  5: HLD- - continue atorvastatin  80mg  daily - LDL 08/19/24 reviewed and was 85 (atorvastatin  was increased) - recheck lipids Dec / Jan   Return in 6 months, sooner if needed.   I spent 20 minutes reviewing records,  interviewing/ examing patient and managing plan/ orders.  William DELENA Class, FNP 09/22/24

## 2024-09-23 ENCOUNTER — Encounter: Payer: Self-pay | Admitting: Family

## 2024-09-23 ENCOUNTER — Ambulatory Visit: Attending: Family | Admitting: Family

## 2024-09-23 VITALS — BP 116/79 | HR 82 | Wt 337.8 lb

## 2024-09-23 DIAGNOSIS — N182 Chronic kidney disease, stage 2 (mild): Secondary | ICD-10-CM | POA: Diagnosis not present

## 2024-09-23 DIAGNOSIS — E7800 Pure hypercholesterolemia, unspecified: Secondary | ICD-10-CM

## 2024-09-23 DIAGNOSIS — Z79899 Other long term (current) drug therapy: Secondary | ICD-10-CM | POA: Diagnosis not present

## 2024-09-23 DIAGNOSIS — I130 Hypertensive heart and chronic kidney disease with heart failure and stage 1 through stage 4 chronic kidney disease, or unspecified chronic kidney disease: Secondary | ICD-10-CM | POA: Diagnosis not present

## 2024-09-23 DIAGNOSIS — M549 Dorsalgia, unspecified: Secondary | ICD-10-CM | POA: Diagnosis not present

## 2024-09-23 DIAGNOSIS — Z7985 Long-term (current) use of injectable non-insulin antidiabetic drugs: Secondary | ICD-10-CM | POA: Diagnosis not present

## 2024-09-23 DIAGNOSIS — I5022 Chronic systolic (congestive) heart failure: Secondary | ICD-10-CM | POA: Diagnosis present

## 2024-09-23 DIAGNOSIS — G8929 Other chronic pain: Secondary | ICD-10-CM | POA: Diagnosis not present

## 2024-09-23 DIAGNOSIS — I2510 Atherosclerotic heart disease of native coronary artery without angina pectoris: Secondary | ICD-10-CM | POA: Diagnosis not present

## 2024-09-23 DIAGNOSIS — F17220 Nicotine dependence, chewing tobacco, uncomplicated: Secondary | ICD-10-CM | POA: Insufficient documentation

## 2024-09-23 DIAGNOSIS — G4733 Obstructive sleep apnea (adult) (pediatric): Secondary | ICD-10-CM | POA: Diagnosis not present

## 2024-09-23 DIAGNOSIS — E1122 Type 2 diabetes mellitus with diabetic chronic kidney disease: Secondary | ICD-10-CM | POA: Insufficient documentation

## 2024-09-23 DIAGNOSIS — I10 Essential (primary) hypertension: Secondary | ICD-10-CM

## 2024-09-23 DIAGNOSIS — E785 Hyperlipidemia, unspecified: Secondary | ICD-10-CM | POA: Diagnosis not present

## 2024-09-23 DIAGNOSIS — I428 Other cardiomyopathies: Secondary | ICD-10-CM | POA: Diagnosis not present

## 2024-09-23 DIAGNOSIS — Z7984 Long term (current) use of oral hypoglycemic drugs: Secondary | ICD-10-CM | POA: Diagnosis not present

## 2024-09-23 DIAGNOSIS — E669 Obesity, unspecified: Secondary | ICD-10-CM | POA: Insufficient documentation

## 2024-09-23 DIAGNOSIS — I272 Pulmonary hypertension, unspecified: Secondary | ICD-10-CM | POA: Insufficient documentation

## 2024-09-23 DIAGNOSIS — I493 Ventricular premature depolarization: Secondary | ICD-10-CM | POA: Insufficient documentation

## 2024-09-23 NOTE — Patient Instructions (Signed)
 Medication Changes:  No medication changes today!  Lab Work: Go downstairs to [facility] on [address] to have your blood work completed.  We will only call you if the results are abnormal or if the provider would like to make medication changes.  No news is good news.   Follow-Up in: Please follow up with the [facility] in 6 months with Ellouise Class, FNP.   Thank you for choosing [facility].    At the [facility], you and your health needs are our priority. We have a designated team specialized in the treatment of Heart Failure. This Care Team includes your primary Heart Failure Specialized Cardiologist (physician), Advanced Practice Providers (APPs- Physician Assistants and Nurse Practitioners), and Pharmacist who all work together to provide you with the care you need, when you need it.   You may see any of the following providers on your designated Care Team at your next follow up:  Dr. Toribio Fuel Dr. Ezra Shuck Dr. Ria Commander Dr. Morene Brownie Ellouise Class, FNP Jaun Bash, RPH-CPP  Please be sure to bring in all your medications bottles to every appointment.   Need to Contact Us :  If you have any questions or concerns before your next appointment please send us  a message through [facility] or call our office at [phone].    TO LEAVE A MESSAGE FOR THE NURSE SELECT OPTION 2, PLEASE LEAVE A MESSAGE INCLUDING: YOUR NAME DATE OF BIRTH CALL BACK NUMBER REASON FOR CALL**this is important as we prioritize the call backs  YOU WILL RECEIVE A CALL BACK THE SAME DAY AS LONG AS YOU CALL BEFORE 4:00 PM

## 2024-09-24 ENCOUNTER — Ambulatory Visit: Payer: Self-pay | Admitting: Family

## 2024-09-24 ENCOUNTER — Other Ambulatory Visit: Payer: Self-pay | Admitting: Family

## 2024-09-24 DIAGNOSIS — I5022 Chronic systolic (congestive) heart failure: Secondary | ICD-10-CM

## 2024-09-24 LAB — BASIC METABOLIC PANEL WITH GFR
BUN/Creatinine Ratio: 11 (ref 9–20)
BUN: 15 mg/dL (ref 6–24)
CO2: 23 mmol/L (ref 20–29)
Calcium: 9.1 mg/dL (ref 8.7–10.2)
Chloride: 103 mmol/L (ref 96–106)
Creatinine, Ser: 1.36 mg/dL — ABNORMAL HIGH (ref 0.76–1.27)
Glucose: 85 mg/dL (ref 70–99)
Potassium: 4.9 mmol/L (ref 3.5–5.2)
Sodium: 142 mmol/L (ref 134–144)
eGFR: 61 mL/min/1.73 (ref >59)

## 2024-09-24 MED ORDER — FUROSEMIDE 40 MG PO TABS: 20.0000 mg | ORAL_TABLET | Freq: Every day | ORAL | Status: AC

## 2024-12-02 ENCOUNTER — Other Ambulatory Visit: Payer: Self-pay | Admitting: Family Medicine

## 2024-12-02 DIAGNOSIS — K219 Gastro-esophageal reflux disease without esophagitis: Secondary | ICD-10-CM

## 2024-12-08 ENCOUNTER — Other Ambulatory Visit: Payer: Self-pay | Admitting: Family Medicine

## 2024-12-08 DIAGNOSIS — K219 Gastro-esophageal reflux disease without esophagitis: Secondary | ICD-10-CM

## 2024-12-13 ENCOUNTER — Other Ambulatory Visit: Payer: Self-pay

## 2024-12-14 MED ORDER — SPIRONOLACTONE 25 MG PO TABS: 25.0000 mg | ORAL_TABLET | Freq: Every day | ORAL | 2 refills | Status: AC

## 2025-03-24 ENCOUNTER — Encounter: Admitting: Family

## 2025-08-25 ENCOUNTER — Other Ambulatory Visit
